# Patient Record
Sex: Male | Born: 1965
Health system: Southern US, Community
[De-identification: ages and names within clinical notes are randomized; demographics above are authoritative.]

## PROBLEM LIST (undated history)

## (undated) DIAGNOSIS — B1920 Unspecified viral hepatitis C without hepatic coma: Secondary | ICD-10-CM

## (undated) DIAGNOSIS — I2510 Atherosclerotic heart disease of native coronary artery without angina pectoris: Secondary | ICD-10-CM

## (undated) DIAGNOSIS — J342 Deviated nasal septum: Secondary | ICD-10-CM

## (undated) DIAGNOSIS — K469 Unspecified abdominal hernia without obstruction or gangrene: Secondary | ICD-10-CM

## (undated) DIAGNOSIS — Z8639 Personal history of other endocrine, nutritional and metabolic disease: Principal | ICD-10-CM

## (undated) DIAGNOSIS — N390 Urinary tract infection, site not specified: Secondary | ICD-10-CM

## (undated) DIAGNOSIS — K5792 Diverticulitis of intestine, part unspecified, without perforation or abscess without bleeding: Secondary | ICD-10-CM

## (undated) DIAGNOSIS — F429 Obsessive-compulsive disorder, unspecified: Secondary | ICD-10-CM

## (undated) DIAGNOSIS — I781 Nevus, non-neoplastic: Secondary | ICD-10-CM

## (undated) DIAGNOSIS — M419 Scoliosis, unspecified: Secondary | ICD-10-CM

## (undated) DIAGNOSIS — T7840XA Allergy, unspecified, initial encounter: Secondary | ICD-10-CM

## (undated) DIAGNOSIS — M129 Arthropathy, unspecified: Secondary | ICD-10-CM

## (undated) DIAGNOSIS — I5020 Unspecified systolic (congestive) heart failure: Secondary | ICD-10-CM

## (undated) DIAGNOSIS — K900 Celiac disease: Secondary | ICD-10-CM

## (undated) DIAGNOSIS — I10 Essential (primary) hypertension: Secondary | ICD-10-CM

## (undated) DIAGNOSIS — D66 Hereditary factor VIII deficiency: Secondary | ICD-10-CM

## (undated) HISTORY — DX: Unspecified systolic (congestive) heart failure: I50.20

## (undated) HISTORY — DX: Diverticulitis of intestine, part unspecified, without perforation or abscess without bleeding: K57.92

## (undated) HISTORY — DX: Nevus, non-neoplastic: I78.1

## (undated) HISTORY — DX: Unspecified viral hepatitis C without hepatic coma: B19.20

## (undated) HISTORY — DX: Celiac disease: K90.0

## (undated) HISTORY — DX: Atherosclerotic heart disease of native coronary artery without angina pectoris: I25.10

## (undated) HISTORY — PX: URETHRA SURGERY: SHX824

## (undated) HISTORY — DX: Essential (primary) hypertension: I10

## (undated) HISTORY — DX: Hereditary factor VIII deficiency: D66

## (undated) HISTORY — DX: Personal history of other endocrine, nutritional and metabolic disease: Z86.39

## (undated) HISTORY — DX: Arthropathy, unspecified: M12.9

## (undated) HISTORY — DX: Unspecified abdominal hernia without obstruction or gangrene: K46.9

## (undated) HISTORY — DX: Allergy, unspecified, initial encounter: T78.40XA

## (undated) HISTORY — DX: Scoliosis, unspecified: M41.9

## (undated) HISTORY — PX: TRIGGER FINGER RELEASE: SHX641

## (undated) HISTORY — PX: SCROTAL SURGERY: SHX2387

## (undated) HISTORY — DX: Deviated nasal septum: J34.2

## (undated) HISTORY — DX: Obsessive-compulsive disorder, unspecified: F42.9

---

## 1994-08-03 HISTORY — PX: ANKLE ARTHROSCOPY: SUR85

## 2001-03-08 ENCOUNTER — Encounter: Payer: Self-pay | Admitting: Emergency Medicine

## 2001-03-08 ENCOUNTER — Emergency Department (HOSPITAL_COMMUNITY): Admission: EM | Admit: 2001-03-08 | Discharge: 2001-03-08 | Payer: Self-pay | Admitting: Emergency Medicine

## 2001-03-09 ENCOUNTER — Ambulatory Visit (HOSPITAL_COMMUNITY): Admission: RE | Admit: 2001-03-09 | Discharge: 2001-03-09 | Payer: Self-pay | Admitting: Internal Medicine

## 2001-03-10 ENCOUNTER — Ambulatory Visit (HOSPITAL_COMMUNITY): Admission: RE | Admit: 2001-03-10 | Discharge: 2001-03-10 | Payer: Self-pay | Admitting: Internal Medicine

## 2001-09-16 ENCOUNTER — Emergency Department (HOSPITAL_COMMUNITY): Admission: EM | Admit: 2001-09-16 | Discharge: 2001-09-16 | Payer: Self-pay | Admitting: Emergency Medicine

## 2002-02-22 ENCOUNTER — Encounter: Payer: Self-pay | Admitting: Geriatric Medicine

## 2002-02-22 ENCOUNTER — Encounter: Admission: RE | Admit: 2002-02-22 | Discharge: 2002-02-22 | Payer: Self-pay | Admitting: Geriatric Medicine

## 2002-05-19 ENCOUNTER — Encounter: Payer: Self-pay | Admitting: Gastroenterology

## 2002-05-19 ENCOUNTER — Ambulatory Visit (HOSPITAL_COMMUNITY): Admission: RE | Admit: 2002-05-19 | Discharge: 2002-05-19 | Payer: Self-pay | Admitting: Gastroenterology

## 2002-11-07 ENCOUNTER — Encounter: Payer: Self-pay | Admitting: Gastroenterology

## 2002-11-07 ENCOUNTER — Encounter: Admission: RE | Admit: 2002-11-07 | Discharge: 2002-11-07 | Payer: Self-pay | Admitting: Gastroenterology

## 2003-01-02 ENCOUNTER — Encounter: Payer: Self-pay | Admitting: Gastroenterology

## 2003-01-02 ENCOUNTER — Encounter: Admission: RE | Admit: 2003-01-02 | Discharge: 2003-01-02 | Payer: Self-pay | Admitting: Gastroenterology

## 2003-03-06 ENCOUNTER — Ambulatory Visit (HOSPITAL_COMMUNITY): Admission: RE | Admit: 2003-03-06 | Discharge: 2003-03-06 | Payer: Self-pay | Admitting: Geriatric Medicine

## 2003-03-06 ENCOUNTER — Encounter: Payer: Self-pay | Admitting: Geriatric Medicine

## 2003-03-26 ENCOUNTER — Encounter: Admission: RE | Admit: 2003-03-26 | Discharge: 2003-03-26 | Payer: Self-pay | Admitting: Gastroenterology

## 2003-03-26 ENCOUNTER — Encounter: Payer: Self-pay | Admitting: Gastroenterology

## 2003-04-03 ENCOUNTER — Encounter: Payer: Self-pay | Admitting: Geriatric Medicine

## 2003-04-03 ENCOUNTER — Encounter: Admission: RE | Admit: 2003-04-03 | Discharge: 2003-04-03 | Payer: Self-pay | Admitting: Geriatric Medicine

## 2003-04-04 ENCOUNTER — Ambulatory Visit (HOSPITAL_COMMUNITY): Admission: RE | Admit: 2003-04-04 | Discharge: 2003-04-04 | Payer: Self-pay | Admitting: Geriatric Medicine

## 2003-11-27 ENCOUNTER — Encounter: Admission: RE | Admit: 2003-11-27 | Discharge: 2003-11-27 | Payer: Self-pay | Admitting: Dermatology

## 2003-12-14 ENCOUNTER — Encounter: Admission: RE | Admit: 2003-12-14 | Discharge: 2003-12-14 | Payer: Self-pay | Admitting: Gastroenterology

## 2004-05-19 ENCOUNTER — Ambulatory Visit (HOSPITAL_COMMUNITY): Admission: RE | Admit: 2004-05-19 | Discharge: 2004-05-19 | Payer: Self-pay | Admitting: General Surgery

## 2004-05-20 ENCOUNTER — Encounter (INDEPENDENT_AMBULATORY_CARE_PROVIDER_SITE_OTHER): Payer: Self-pay | Admitting: Specialist

## 2004-05-20 ENCOUNTER — Ambulatory Visit (HOSPITAL_COMMUNITY): Admission: RE | Admit: 2004-05-20 | Discharge: 2004-05-20 | Payer: Self-pay | Admitting: General Surgery

## 2004-05-20 ENCOUNTER — Ambulatory Visit (HOSPITAL_BASED_OUTPATIENT_CLINIC_OR_DEPARTMENT_OTHER): Admission: RE | Admit: 2004-05-20 | Discharge: 2004-05-20 | Payer: Self-pay | Admitting: General Surgery

## 2004-07-31 ENCOUNTER — Ambulatory Visit: Payer: Self-pay | Admitting: Oncology

## 2004-11-18 ENCOUNTER — Encounter: Admission: RE | Admit: 2004-11-18 | Discharge: 2004-11-18 | Payer: Self-pay | Admitting: Gastroenterology

## 2005-02-20 ENCOUNTER — Ambulatory Visit: Payer: Self-pay | Admitting: Oncology

## 2005-08-19 ENCOUNTER — Ambulatory Visit: Payer: Self-pay | Admitting: Oncology

## 2005-09-22 ENCOUNTER — Encounter: Admission: RE | Admit: 2005-09-22 | Discharge: 2005-12-21 | Payer: Self-pay | Admitting: Gastroenterology

## 2005-12-01 ENCOUNTER — Encounter: Admission: RE | Admit: 2005-12-01 | Discharge: 2005-12-01 | Payer: Self-pay | Admitting: Gastroenterology

## 2006-05-18 ENCOUNTER — Ambulatory Visit: Payer: Self-pay | Admitting: Oncology

## 2007-01-19 ENCOUNTER — Ambulatory Visit: Payer: Self-pay | Admitting: Oncology

## 2007-01-25 LAB — FACTOR 8 ASSAY: Coagulation Factor VIII: 4 % — ABNORMAL LOW (ref 57–124)

## 2007-01-25 LAB — FACTOR 8 INHIBITOR
Factor VIII: C, Activity: 3 % — CL (ref 56–190)
VIII Inhibitor, Bethesda: 0.5 U/mL — ABNORMAL HIGH (ref 0.0–0.0)

## 2007-01-26 LAB — VITAMIN D PNL(25-HYDRXY+1,25-DIHY)-BLD
Vit D, 1,25-Dihydroxy: 32 pg/mL (ref 6–62)
Vit D, 25-Hydroxy: 23 ng/mL (ref 20–57)

## 2007-01-26 LAB — HEPATITIS C RNA QUANTITATIVE
HCV Quantitative Log: <0.7 {Log} (ref <0.70)
HCV Quantitative: <5 IU/mL (ref <5)

## 2007-03-30 ENCOUNTER — Encounter (INDEPENDENT_AMBULATORY_CARE_PROVIDER_SITE_OTHER): Payer: Self-pay | Admitting: Geriatric Medicine

## 2007-03-30 ENCOUNTER — Ambulatory Visit (HOSPITAL_COMMUNITY): Admission: RE | Admit: 2007-03-30 | Discharge: 2007-03-30 | Payer: Self-pay | Admitting: Geriatric Medicine

## 2007-08-24 ENCOUNTER — Encounter: Admission: RE | Admit: 2007-08-24 | Discharge: 2007-08-24 | Payer: Self-pay | Admitting: Gastroenterology

## 2007-09-21 ENCOUNTER — Ambulatory Visit: Payer: Self-pay | Admitting: Oncology

## 2008-06-14 ENCOUNTER — Ambulatory Visit: Payer: Self-pay | Admitting: Gastroenterology

## 2008-06-19 ENCOUNTER — Ambulatory Visit: Payer: Self-pay | Admitting: Oncology

## 2009-07-05 ENCOUNTER — Ambulatory Visit: Payer: Self-pay | Admitting: Oncology

## 2010-03-21 ENCOUNTER — Ambulatory Visit: Payer: Self-pay | Admitting: Oncology

## 2010-03-24 LAB — CBC WITH DIFFERENTIAL/PLATELET
BASO%: 0.5 % (ref 0.0–2.0)
Basophils Absolute: 0 10*3/uL (ref 0.0–0.1)
EOS%: 1.3 % (ref 0.0–7.0)
Eosinophils Absolute: 0.1 10*3/uL (ref 0.0–0.5)
HCT: 41.7 % (ref 38.4–49.9)
HGB: 14.3 g/dL (ref 13.0–17.1)
LYMPH%: 27.9 % (ref 14.0–49.0)
MCH: 30.9 pg (ref 27.2–33.4)
MCHC: 34.3 g/dL (ref 32.0–36.0)
MCV: 90.2 fL (ref 79.3–98.0)
MONO#: 0.7 10*3/uL (ref 0.1–0.9)
MONO%: 10.6 % (ref 0.0–14.0)
NEUT#: 4.2 10*3/uL (ref 1.5–6.5)
NEUT%: 59.7 % (ref 39.0–75.0)
Platelets: 329 10*3/uL (ref 140–400)
RBC: 4.63 10*6/uL (ref 4.20–5.82)
RDW: 12.7 % (ref 11.0–14.6)
WBC: 7 10*3/uL (ref 4.0–10.3)
lymph#: 2 10*3/uL (ref 0.9–3.3)

## 2010-03-24 LAB — COMPREHENSIVE METABOLIC PANEL
ALT: 23 U/L (ref 0–53)
AST: 28 U/L (ref 0–37)
Albumin: 4.5 g/dL (ref 3.5–5.2)
Alkaline Phosphatase: 85 U/L (ref 39–117)
BUN: 9 mg/dL (ref 6–23)
CO2: 28 mEq/L (ref 19–32)
Calcium: 9.5 mg/dL (ref 8.4–10.5)
Chloride: 107 mEq/L (ref 96–112)
Creatinine, Ser: 0.67 mg/dL (ref 0.40–1.50)
Glucose, Bld: 119 mg/dL — ABNORMAL HIGH (ref 70–99)
Potassium: 3.8 mEq/L (ref 3.5–5.3)
Sodium: 140 mEq/L (ref 135–145)
Total Bilirubin: 0.6 mg/dL (ref 0.3–1.2)
Total Protein: 8.1 g/dL (ref 6.0–8.3)

## 2010-03-24 LAB — PROTIME-INR
INR: 1 — ABNORMAL LOW (ref 2.00–3.50)
Protime: 12 Seconds (ref 10.6–13.4)

## 2010-03-24 LAB — AMMONIA: Ammonia: 34 umol/L (ref 11–35)

## 2010-03-25 LAB — FACTOR 8 ASSAY: Coagulation Factor VIII: 17 % — ABNORMAL LOW (ref 73–140)

## 2010-03-26 LAB — HEPATITIS C RNA QUANTITATIVE

## 2010-04-05 LAB — FACTOR 8 INHIBITOR: Factor VIII: C, Activity: 2 % — CL (ref 56–191)

## 2010-04-05 LAB — FACTOR 8 ASSAY: Coagulation Factor VIII: 10 % — ABNORMAL LOW (ref 73–140)

## 2010-04-18 ENCOUNTER — Encounter: Admission: RE | Admit: 2010-04-18 | Discharge: 2010-04-18 | Payer: Self-pay | Admitting: Gastroenterology

## 2010-11-20 ENCOUNTER — Encounter (HOSPITAL_BASED_OUTPATIENT_CLINIC_OR_DEPARTMENT_OTHER): Payer: BC Managed Care – PPO | Admitting: Oncology

## 2010-11-20 DIAGNOSIS — B1920 Unspecified viral hepatitis C without hepatic coma: Secondary | ICD-10-CM

## 2010-11-20 DIAGNOSIS — E559 Vitamin D deficiency, unspecified: Secondary | ICD-10-CM

## 2010-11-20 DIAGNOSIS — D66 Hereditary factor VIII deficiency: Secondary | ICD-10-CM

## 2010-11-20 DIAGNOSIS — K900 Celiac disease: Secondary | ICD-10-CM

## 2010-12-19 NOTE — Op Note (Signed)
NAME:  Darrell Graham, Darrell Graham              ACCOUNT NO.:  [id number]   MEDICAL RECORD NO.:  [id number]          PATIENT TYPE:  AMB   LOCATION:  DSC                          FACILITY:  [facility]   PHYSICIAN:  Sharlet Salina T. Hoxworth, M.D.DATE OF BIRTH:  05/28/1966   DATE OF PROCEDURE:  05/20/2004  DATE OF DISCHARGE:                                 OPERATIVE REPORT   PREOPERATIVE DIAGNOSIS:  Sebaceous cyst of scrotum x3.   PROCEDURE:  Excision of sebaceous cyst of scrotum x3.   SURGEON:  Lorne Skeens. Hoxworth, M.D.   ANESTHESIA:  Local with IV sedation.   INDICATIONS FOR PROCEDURE:  The patient is a 45 year old male who presents  with enlarging and uncomfortable sebaceous cysts involving the scrotum in  three areas.  Examination shows 5 mm, 4 mm, and 2 mm sebaceous cysts.  These  are enlarging, tender, and bother him.  He does have a significant history  of hemophilia.  After discussion of benefits, risks, and options, we have  elected to proceed with excision under local anesthesia with sedation.  He  received preoperative recombinant factor VIII this morning.  He is now  brought to the operating room for excision.   DESCRIPTION OF PROCEDURE:  The patient was brought to the operating room and  placed in the supine position on the operating table.  IV sedation was  administered.  He was placed in the frog-leg position and the perineum and  scrotum sterilely prepped and draped.  Ancef was given preoperatively.  Local anesthesia was used to infiltrate the scrotal skin surrounding each  lesion.  In each case, the lesion was elliptically excised with a small rim  of surrounding normal skin into the subcutaneous tissue.  Hemostasis was  obtained with cautery.  There was no unusual bleeding.  Each wound was then  closed with interrupted 4-0 nylon.  Needle, sponge, and instrument counts  correct.  Dry gauze dressing was applied and the patient was taken to the  recovery room in good condition.       BTH/MEDQ  D:  05/20/2004  T:  05/20/2004  Job:  [id number]

## 2011-04-02 ENCOUNTER — Ambulatory Visit
Admission: RE | Admit: 2011-04-02 | Discharge: 2011-04-02 | Disposition: A | Discharge status: Home / Self Care | Payer: BC Managed Care – PPO | Source: Ambulatory Visit | Attending: Geriatric Medicine | Admitting: Geriatric Medicine

## 2011-04-02 ENCOUNTER — Other Ambulatory Visit: Payer: Self-pay | Admitting: Geriatric Medicine

## 2011-04-02 DIAGNOSIS — J3489 Other specified disorders of nose and nasal sinuses: Secondary | ICD-10-CM

## 2011-04-08 ENCOUNTER — Other Ambulatory Visit: Payer: Self-pay | Admitting: Geriatric Medicine

## 2011-04-08 ENCOUNTER — Ambulatory Visit
Admission: RE | Admit: 2011-04-08 | Discharge: 2011-04-08 | Disposition: A | Discharge status: Home / Self Care | Payer: BC Managed Care – PPO | Source: Ambulatory Visit | Attending: Geriatric Medicine | Admitting: Geriatric Medicine

## 2011-04-08 DIAGNOSIS — R29898 Other symptoms and signs involving the musculoskeletal system: Secondary | ICD-10-CM

## 2011-04-14 ENCOUNTER — Other Ambulatory Visit: Payer: Self-pay | Admitting: Neurology

## 2011-04-14 ENCOUNTER — Ambulatory Visit
Admission: RE | Admit: 2011-04-14 | Discharge: 2011-04-14 | Disposition: A | Discharge status: Home / Self Care | Payer: BC Managed Care – PPO | Source: Ambulatory Visit | Attending: Neurology | Admitting: Neurology

## 2011-04-14 DIAGNOSIS — R531 Weakness: Secondary | ICD-10-CM

## 2011-04-14 DIAGNOSIS — R5383 Other fatigue: Secondary | ICD-10-CM

## 2011-05-04 ENCOUNTER — Other Ambulatory Visit: Payer: Self-pay | Admitting: Neurological Surgery

## 2011-05-07 ENCOUNTER — Other Ambulatory Visit: Payer: Self-pay | Admitting: Neurology

## 2011-05-07 DIAGNOSIS — G35 Multiple sclerosis: Secondary | ICD-10-CM

## 2011-05-13 ENCOUNTER — Other Ambulatory Visit: Payer: BC Managed Care – PPO

## 2011-05-19 ENCOUNTER — Other Ambulatory Visit: Payer: BC Managed Care – PPO

## 2011-06-09 ENCOUNTER — Inpatient Hospital Stay
Admission: RE | Admit: 2011-06-09 | Discharge: 2011-06-09 | Payer: BC Managed Care – PPO | Source: Ambulatory Visit | Attending: Neurology | Admitting: Neurology

## 2011-06-17 ENCOUNTER — Other Ambulatory Visit: Payer: BC Managed Care – PPO

## 2011-10-23 ENCOUNTER — Telehealth: Payer: Self-pay | Admitting: *Deleted

## 2011-10-23 NOTE — Telephone Encounter (Signed)
Call from pt reporting internal, ankle bleed. Pt has Factor VIII in the home, S/O was able to administer a dose on 10/22/11 but unable to access vein today. Pt needs renewal of standing order for nursing services.  Reviewed with Dr. Truett Perna: OK to renew nursing order. Have pt report to ED if bleeding does not subside. Call from [facility] with [facility] to renew order. Verbal order given to administer 3,000 units Factor 8 IV as needed for bleeding.  Pt given instructions to go to ED if bleeding does not stop. He verbalized understanding.

## 2011-11-05 ENCOUNTER — Telehealth: Payer: Self-pay | Admitting: *Deleted

## 2011-11-05 NOTE — Telephone Encounter (Signed)
Patient left VM reporting a bleed on left shin 11/04/11 and RN came to home and administered 3000 units of Xynthia. Doing much better today. Confirms he is aware of his appointment next week.

## 2011-11-19 ENCOUNTER — Ambulatory Visit (HOSPITAL_BASED_OUTPATIENT_CLINIC_OR_DEPARTMENT_OTHER): Payer: BC Managed Care – PPO | Admitting: Oncology

## 2011-11-19 ENCOUNTER — Telehealth: Payer: Self-pay | Admitting: *Deleted

## 2011-11-19 ENCOUNTER — Ambulatory Visit: Payer: BC Managed Care – PPO | Admitting: Lab

## 2011-11-19 ENCOUNTER — Encounter: Payer: Self-pay | Admitting: Oncology

## 2011-11-19 VITALS — BP 127/77 | HR 92 | Temp 99.1°F | Ht 70.0 in | Wt 170.8 lb

## 2011-11-19 DIAGNOSIS — D66 Hereditary factor VIII deficiency: Secondary | ICD-10-CM

## 2011-11-19 DIAGNOSIS — M25639 Stiffness of unspecified wrist, not elsewhere classified: Secondary | ICD-10-CM

## 2011-11-19 DIAGNOSIS — M25676 Stiffness of unspecified foot, not elsewhere classified: Secondary | ICD-10-CM

## 2011-11-19 DIAGNOSIS — R5383 Other fatigue: Secondary | ICD-10-CM

## 2011-11-19 DIAGNOSIS — R5381 Other malaise: Secondary | ICD-10-CM

## 2011-11-19 DIAGNOSIS — M25673 Stiffness of unspecified ankle, not elsewhere classified: Secondary | ICD-10-CM

## 2011-11-19 LAB — CBC WITH DIFFERENTIAL/PLATELET
BASO%: 0.4 % (ref 0.0–2.0)
Basophils Absolute: 0 10*3/uL (ref 0.0–0.1)
EOS%: 0.7 % (ref 0.0–7.0)
Eosinophils Absolute: 0 10*3/uL (ref 0.0–0.5)
HCT: 40.5 % (ref 38.4–49.9)
HGB: 14 g/dL (ref 13.0–17.1)
LYMPH%: 21 % (ref 14.0–49.0)
MCH: 31.2 pg (ref 27.2–33.4)
MCHC: 34.6 g/dL (ref 32.0–36.0)
MCV: 90.3 fL (ref 79.3–98.0)
MONO#: 0.6 10*3/uL (ref 0.1–0.9)
MONO%: 8.6 % (ref 0.0–14.0)
NEUT#: 5.1 10*3/uL (ref 1.5–6.5)
NEUT%: 69.3 % (ref 39.0–75.0)
Platelets: 321 10*3/uL (ref 140–400)
RBC: 4.48 10*6/uL (ref 4.20–5.82)
RDW: 13 % (ref 11.0–14.6)
WBC: 7.4 10*3/uL (ref 4.0–10.3)
lymph#: 1.5 10*3/uL (ref 0.9–3.3)
nRBC: 0 % (ref 0–0)

## 2011-11-19 NOTE — Telephone Encounter (Signed)
gave patient appointment for 07-2012 printed out calendar and gave to the patient 

## 2011-11-19 NOTE — Progress Notes (Signed)
OFFICE PROGRESS NOTE   INTERVAL HISTORY:   She returns as scheduled. He was diagnosed with a pineal cyst on an MRI of the brain in September 2012. He reports being evaluated by neurology and no specific treatment recommendation was made.  He continues to feel weak and tired. He had soft tissue bleeds at the right and left lower leg following trauma. These bleeds were spaced approximately one to 2 weeks apart and occurred 1 month ago. The bleeding responded to factor VIII replacement. He continues home treatment via a home care RN. He generally requires treatment once every 6-8 months. Limited range of motion at the right elbow and left ankle.  Objective:  Vital signs in last 24 hours:  Blood pressure 127/77, pulse 92, temperature 99.1 F (37.3 C), temperature source Oral, height 5\' 10"  (1.778 m), weight 170 lb 12.8 oz (77.474 kg).   Resp: Lungs clear bilaterally Cardio: Regular rate and rhythm GI: No hepatosplenomegaly Vascular: No leg edema  Skin: Telangiectasias at the upper chest Musculoskeletal: Limited range of motion at the right elbow and left ankle. There is a small resolved hematoma at the left lower pretibial area.   Lab Results:  Factor VIII level and hepatitis C RNA pending   Medications: I have reviewed the patient's current medications.  Assessment/Plan: 1.  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 2.  History of hepatitis C infection. He requested a hepatitis C RNA level today 3.  Chronic left ankle and right elbow arthropathy. 4.  Celiac disease. 5.  History of vitamin D deficiency. 6.  History of hypertension. 7.  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 8.  diagnosis of "scoliosis." 9. 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 He will continue home treatment with factor VIII as needed. He requested a factor VIII level, hepatitis C  RNA, and CBC today. He will return problems visit in 8 months. He continues followup with Dr. Pete Glatter for internal medicine care.   Thornton Papas, MD  11/19/2011  1:52 PM

## 2011-11-24 LAB — FACTOR 8 INHIBITOR

## 2011-11-24 LAB — COMPREHENSIVE METABOLIC PANEL
ALT: 14 U/L (ref 0–53)
AST: 21 U/L (ref 0–37)
Albumin: 4.8 g/dL (ref 3.5–5.2)
Alkaline Phosphatase: 69 U/L (ref 39–117)
BUN: 13 mg/dL (ref 6–23)
CO2: 28 mEq/L (ref 19–32)
Calcium: 9.5 mg/dL (ref 8.4–10.5)
Chloride: 105 mEq/L (ref 96–112)
Creatinine, Ser: 0.86 mg/dL (ref 0.50–1.35)
Glucose, Bld: 124 mg/dL — ABNORMAL HIGH (ref 70–99)
Potassium: 4 mEq/L (ref 3.5–5.3)
Sodium: 142 mEq/L (ref 135–145)
Total Bilirubin: 0.4 mg/dL (ref 0.3–1.2)
Total Protein: 7.6 g/dL (ref 6.0–8.3)

## 2011-11-24 LAB — FACTOR 8 ASSAY: Coagulation Factor VIII: 7 % — ABNORMAL LOW (ref 73–140)

## 2011-11-24 LAB — HEPATITIS C RNA QUANTITATIVE: HCV Quantitative: NOT DETECTED IU/mL (ref <43)

## 2011-11-25 ENCOUNTER — Telehealth: Payer: Self-pay | Admitting: *Deleted

## 2011-11-25 NOTE — Telephone Encounter (Signed)
Message copied by Caleb Popp on Wed Nov 25, 2011  1:14 PM ------      Message from: Thornton Papas B      Created: Mon Nov 23, 2011  7:02 PM       Please call patient, hep. C not detected.  Factor 8 at 7%, f/u as scheduled

## 2011-11-25 NOTE — Telephone Encounter (Signed)
Called pt with lab results. He requests a copy to be mailed to home.

## 2011-12-01 ENCOUNTER — Encounter: Payer: Self-pay | Admitting: *Deleted

## 2011-12-01 NOTE — Progress Notes (Signed)
Lab results from 11/19/11 mailed to patient per his request.

## 2012-01-11 ENCOUNTER — Other Ambulatory Visit: Payer: Self-pay | Admitting: *Deleted

## 2012-01-11 NOTE — Telephone Encounter (Signed)
Pharmacy faxed request for refill on xyntha 3000 units prn bleeding X 2 doses. Patient wants over night delivery. Need fax returned asap. Forwarded message to triage.

## 2012-01-12 ENCOUNTER — Other Ambulatory Visit: Payer: Self-pay | Admitting: *Deleted

## 2012-01-12 DIAGNOSIS — D689 Coagulation defect, unspecified: Secondary | ICD-10-CM

## 2012-01-12 MED ORDER — ANTIHEM FACTOR RECOMB (RFVIII) 3000 UNITS IV KIT: 3000.0000 [IU] | PACK | INTRAVENOUS | Status: DC | PRN

## 2012-05-05 ENCOUNTER — Encounter: Payer: Self-pay | Admitting: Oncology

## 2012-05-05 NOTE — Progress Notes (Signed)
Faxed signed orders to [facility] [phone].

## 2012-07-21 ENCOUNTER — Ambulatory Visit: Payer: BC Managed Care – PPO | Admitting: Oncology

## 2012-07-21 ENCOUNTER — Telehealth: Payer: Self-pay | Admitting: Oncology

## 2012-07-21 NOTE — Telephone Encounter (Signed)
Pt called and r/s appt to January 21st MD only, pt aware of appt, nurse notified

## 2012-08-23 ENCOUNTER — Ambulatory Visit: Payer: BC Managed Care – PPO | Admitting: Oncology

## 2012-09-23 ENCOUNTER — Ambulatory Visit (HOSPITAL_BASED_OUTPATIENT_CLINIC_OR_DEPARTMENT_OTHER): Payer: BC Managed Care – PPO | Admitting: Oncology

## 2012-09-23 ENCOUNTER — Telehealth: Payer: Self-pay | Admitting: *Deleted

## 2012-09-23 ENCOUNTER — Ambulatory Visit (HOSPITAL_BASED_OUTPATIENT_CLINIC_OR_DEPARTMENT_OTHER): Payer: BC Managed Care – PPO | Admitting: Lab

## 2012-09-23 ENCOUNTER — Telehealth: Payer: Self-pay | Admitting: Oncology

## 2012-09-23 VITALS — BP 144/88 | HR 86 | Temp 97.9°F | Resp 20 | Ht 70.0 in | Wt 164.6 lb

## 2012-09-23 DIAGNOSIS — F430 Acute stress reaction: Secondary | ICD-10-CM

## 2012-09-23 DIAGNOSIS — B1920 Unspecified viral hepatitis C without hepatic coma: Secondary | ICD-10-CM

## 2012-09-23 DIAGNOSIS — M19029 Primary osteoarthritis, unspecified elbow: Secondary | ICD-10-CM

## 2012-09-23 DIAGNOSIS — D66 Hereditary factor VIII deficiency: Secondary | ICD-10-CM

## 2012-09-23 NOTE — Telephone Encounter (Signed)
Called Dr. Nolen Mu psychiatry left messasge regarding referral gave order to HIM to fax medical records, also gave pt appt for November Md only

## 2012-09-23 NOTE — Progress Notes (Signed)
   [facility]    OFFICE PROGRESS NOTE   INTERVAL HISTORY:   He returns as scheduled. He reports last receiving factor VIII treatment approximately 6 months ago. He has chronic stiffness at the right elbow and left ankle. He has chronic back pain secondary to "scoliosis ". He developed a bleed in the right elbow approximately 2 days ago after washing his car. He does not feel this bleed is severe enough to require factor VIII therapy.  Mr. Shakespeare complains of feeling "stress "secondary to his mother being diagnosed with Alzheimer's. He cares for her in the home. He request an evaluation for stress and emotional lability.  Objective:  Vital signs in last 24 hours:  Blood pressure 144/88, pulse 86, temperature 97.9 F (36.6 C), temperature source Oral, resp. rate 20, height 5\' 10"  (1.778 m), weight 164 lb 9.6 oz (74.662 kg).   Resp: Lungs clear bilaterally Cardio: Regular rate and rhythm GI: No hepatosplenic the, mild tenderness in the right subcostal region Vascular: No leg edema  Skin: Multiple telengectasias over the trunk  Muscular skeletal: There is warmth and mild swelling at the right elbow.  Portacath/PICC-without erythema  Lab Results:  Lab Results  Component Value Date   WBC 7.4 11/19/2011   HGB 14.0 11/19/2011   HCT 40.5 11/19/2011   MCV 90.3 11/19/2011   PLT 321 11/19/2011   ANC 5.1  Factor VIII level 7%-11/19/2011 Hepatitis C RNA quantitative-not detected-11/19/2011  Medications: I have reviewed the patient's current medications.  Assessment/Plan: 1. 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  2. History of hepatitis C infection. Negative hepatitis C RNA in April 2013. He requested we check another level today. 3. Chronic left ankle and right elbow arthropathy. Acute hemorrhage at the right elbow today. 4. Celiac disease.  5. History of vitamin D deficiency.  6. History of hypertension.  7. Telangiectasias of the  upper back and chest.  8. diagnosis of "scoliosis."  9. Diagnosis of a "pineal "cyst in September 2012, evaluated by neurology    Disposition:  His overall status is unchanged. He will treat the right elbow hemorrhage with conservative measures. He has developed chronic arthropathy of the right elbow and left ankle. He will contact us if he desires an orthopedic referral.  Mr. Keltz requested a psychiatry referral for evaluation of "stress "and emotional swings.  He will return for an office visit in one year. He will contact us in the interim as needed. He continues to receive factor VIII replacement at home via a home care agency.   Thornton Papas, MD  09/23/2012  1:25 PM

## 2012-09-23 NOTE — Telephone Encounter (Signed)
Left message offering pt earlier appt today at 1130. Scheduled to see MD at 1230.

## 2012-09-26 ENCOUNTER — Encounter: Payer: Self-pay | Admitting: Oncology

## 2012-09-26 NOTE — Progress Notes (Signed)
Pt's daughter called requesting a copy of receipt for 09/23/12 copay pt made that he misplaced.  I got receipt from Ms. Wilma, made a copy and mailed to pt today.

## 2012-09-27 ENCOUNTER — Telehealth: Payer: Self-pay | Admitting: Oncology

## 2012-09-27 ENCOUNTER — Telehealth: Payer: Self-pay | Admitting: *Deleted

## 2012-09-27 LAB — FACTOR 8 INHIBITOR: Factor VIII: C, Activity: 5 % — CL (ref 56–191)

## 2012-09-27 LAB — FACTOR 8 ASSAY: Coagulation Factor VIII: 8 % — ABNORMAL LOW (ref 73–140)

## 2012-09-27 LAB — HEPATITIS C RNA QUANTITATIVE: HCV Quantitative: NOT DETECTED IU/mL (ref <15)

## 2012-09-27 NOTE — Telephone Encounter (Signed)
Dr Nolen Mu office, psychiatry wants pt to call them, called pt,left  tle# to her office so patient can set up schedule

## 2012-09-27 NOTE — Telephone Encounter (Signed)
Left message on voicemail for pt to call office for lab results. 

## 2012-09-27 NOTE — Telephone Encounter (Signed)
Faxed pt medical records to Dr. Nolen Mu

## 2012-09-27 NOTE — Telephone Encounter (Signed)
Message copied by Caleb Popp on Tue Sep 27, 2012  5:16 PM ------      Message from: Thornton Papas B      Created: Mon Sep 26, 2012  9:41 PM       Please call patient, f8 level is stable, hep RNA is negative ------

## 2012-09-28 ENCOUNTER — Telehealth: Payer: Self-pay | Admitting: *Deleted

## 2012-09-28 NOTE — Telephone Encounter (Signed)
Message copied by Gala Romney on Wed Sep 28, 2012  3:51 PM ------      Message from: Thornton Papas B      Created: Mon Sep 26, 2012  9:41 PM       Please call patient, f8 level is stable, hep RNA is negative ------

## 2012-09-28 NOTE — Telephone Encounter (Signed)
Spoke with pt and he was informed of lab results.  Pt requested copy sent to him and asked to forward results to PCP Dr. Merlene Laughter.  Copies put in mail and results forwarded.

## 2013-01-24 ENCOUNTER — Encounter: Payer: Self-pay | Admitting: Oncology

## 2013-01-30 NOTE — Progress Notes (Signed)
Faxed completed orders back to [facility] for his home health.

## 2013-02-06 ENCOUNTER — Other Ambulatory Visit: Payer: Self-pay | Admitting: *Deleted

## 2013-02-06 DIAGNOSIS — D689 Coagulation defect, unspecified: Secondary | ICD-10-CM

## 2013-02-06 NOTE — Telephone Encounter (Signed)
THIS REFILL REQUEST FOR XYNTHA WAS GIVEN TO DR.SHERRILL'S NURSE, SUSAN COWARD,RN.

## 2013-02-07 MED ORDER — ANTIHEM FACTOR RECOMB (RFVIII) 3000 UNITS IV KIT: 3000.0000 [IU] | PACK | INTRAVENOUS | Status: DC | PRN

## 2013-02-07 NOTE — Addendum Note (Signed)
Addended by: Arvilla Meres on: 02/07/2013 12:24 PM   Modules accepted: Orders

## 2013-02-14 ENCOUNTER — Encounter: Payer: Self-pay | Admitting: Oncology

## 2013-02-14 NOTE — Progress Notes (Addendum)
Completed [employer] paperwork for patient assistance. Called Lizzie and let him know it was ready to be picked up. Met patient outside and gave him the papers.

## 2013-03-13 ENCOUNTER — Telehealth: Payer: Self-pay | Admitting: Neurology

## 2013-03-13 NOTE — Telephone Encounter (Signed)
From [facility], last seen 2012.  Needs appt (Dr.Yan)

## 2013-03-20 NOTE — Telephone Encounter (Signed)
Called pt scheduled for 03/23/13 @2 :30

## 2013-03-23 ENCOUNTER — Ambulatory Visit (INDEPENDENT_AMBULATORY_CARE_PROVIDER_SITE_OTHER): Payer: BC Managed Care – PPO | Admitting: Neurology

## 2013-03-23 ENCOUNTER — Encounter: Payer: Self-pay | Admitting: Neurology

## 2013-03-23 VITALS — BP 121/67 | HR 99 | Ht 71.0 in | Wt 156.0 lb

## 2013-03-23 DIAGNOSIS — D66 Hereditary factor VIII deficiency: Secondary | ICD-10-CM | POA: Insufficient documentation

## 2013-03-23 DIAGNOSIS — R51 Headache: Secondary | ICD-10-CM

## 2013-03-23 DIAGNOSIS — R519 Headache, unspecified: Secondary | ICD-10-CM | POA: Insufficient documentation

## 2013-03-23 MED ORDER — NORTRIPTYLINE HCL 10 MG PO CAPS: 20.0000 mg | ORAL_CAPSULE | Freq: Every day | ORAL | Status: DC

## 2013-03-24 NOTE — Progress Notes (Signed)
History of Present Illness:   Jantz is a 47 year old right-handed Caucasian male, accompanied by his mother at today's clinical visit. I saw him previously in 04/10/2011.  He had past medical history of hemophilia, receiving factor VIII transfusion occasionally, has knee joints bleeding sometimes, he also had a history of hepatitis C, was treated with interferon in the past,   I saw him in 2012 for mild abnormal MRI of the brain which was taken to complains of frequent headaches, stiff neck, fluid sensation in his head and face, difficulty swallowing, anxiety, heart palpitations, difficulty swallowing, pain in extremities,   MRI of brain showed evidence of 16 mm pineal cyst, there was also scattered punctuated subcortical T2 and FLAIR hyperdensity bilaterally, most consistent with microvascular ischemia, there is no evidence of multiple sclerosis.  He returned today continued to complains of constant headaches, 6 out of 10, pressure sensation, as if his eyes going to popping mild, getting worse when he lays down on his pillow, he denies dysphagia, no dysarthria, no visual change, he also complains of gait difficulty because of bilateral knee pain, legs gave out underneath him, his balance are off easily.  He continued to combat  Anxiety.   Review of Systems  Out of a complete 14 system review, the patient complains of only the following symptoms, and all other reviewed systems are negative.   Weight loss, fatigue, ringing in ears, rash, itching, blurred vision, double vision, eye pain, urination problem, easy bruising, easy bleeding, feeling hot, increased thirst, flushing, joints pain, joint swelling, achy muscles, allergy, runny nose, skin sensitivity, memory loss, confusion, headaches, weakness, insomnia, sleepiness, anxiety, not enough sleep, decreased energy, racing thoughts  Social History  Patient is single he lives in the home with his mother. He completed 12 years of high school. Patient  is currently not working. Patient denies tobacco, alcohol, and illicit drug use. Patient consumes 1 soft drink daily. Any Tobacco Use: Never used Inhaled Tobacco Use: never smoker  Family History  Both parents history of hypertension. Patient's mother alive and father deceased from stroke.  Past Medical History  hemophilia factor deficiency- Dr. Truett Perna Allergic rhinits hepatitis C Osteopenia Psoriasis GERD Vit D deficiency Hypertension Diverticulitis Hiatal hernia Glaucoma Scoliosis  Surgical History  none listed  Physical Exam  Neck: supple no carotid bruits Respiratory: clear to auscultation bilaterally Cardiovascular: regular rate rhythm  Neurologic Exam  Mental Status: tired anxious looking middle aged male, awake, alert, cooperative to history, talking, and casual conversation. Cranial Nerves: CN II-XII pupils were equal round reactive to light.  Fundi were sharp bilaterally.  Extraocular movements were full.  Visual fields were full on confrontational test.  Facial sensation and strength were normal.  Hearing was intact to finger rubbing bilaterally.  Uvula tongue were midline.  Head turning and shoulder shrugging were normal and symmetric.  Tongue protrusion into the cheeks strength were normal.  Motor: Normal tone, bulk, and strength. Sensory: Normal to light touch, pinprick, proprioception, and vibratory sensation. Coordination: Normal finger-to-nose, heel-to-shin.  There was no dysmetria noticed. Gait and Station: Narrow based and steady, was able to perform tiptoe, heel, and tandem walking without difficulty.  Romberg sign: Negative Reflexes: Deep tendon reflexes: Biceps: 2/2, Brachioradialis: 2/2, Triceps: 2/2, Pateller: 2/2, Achilles: 2/2.  Plantar responses are flexor.   Assessment and Plan:  47 year old right-handed Caucasian male, presenting with generalized fatigue, weakness, normal neurological examination, MRI of the brain has demonstrate 16 mm cystic  lesion at the pineal gland, scattered punctuated T2 and FLAIR  hyperdensity consistent with a microvascular ischemia, the morphology and the distribution are not consistent with multiple sclerosis.  1. he now with worsening headaches, design further evaluation, with his history of hemophilia, pineal cyst in the past, will repeat MRI of the brain. 2. his headaches are consistent with tension headache, will add on nortriptyline as preventive medications 3.   Return to clinic with Eber Jones in 3 months

## 2013-03-30 ENCOUNTER — Ambulatory Visit (INDEPENDENT_AMBULATORY_CARE_PROVIDER_SITE_OTHER): Payer: BC Managed Care – PPO

## 2013-03-30 DIAGNOSIS — D66 Hereditary factor VIII deficiency: Secondary | ICD-10-CM

## 2013-03-30 DIAGNOSIS — R51 Headache: Secondary | ICD-10-CM

## 2013-04-04 NOTE — Progress Notes (Signed)
Quick Note:  Please call him, MRI brain showed no changes compared to 04/08/2011, continued evidence of small pineal cyst. ______

## 2013-04-05 ENCOUNTER — Telehealth: Payer: Self-pay | Admitting: *Deleted

## 2013-04-05 NOTE — Telephone Encounter (Signed)
I saw Dr. Terrace Arabia a few week ago. She prescribed meds that are too strong. I am not taking the medication. Please advise.  I would like to request  My last MRI result and the result from Two years ago. I would like to have the mailed to my home.

## 2013-04-05 NOTE — Telephone Encounter (Signed)
Call to patient and reviewed Dr. Zannie Cove responses. I counseled him on how to do gentle stretching neck exercises. He asked that I recommend something for when he does have headaches. I let him know I can not make recommendations but, whatever he would use generally over-the-counter. Should he have increased headaches then give [place] a call back.   Patient thanked me for my time.

## 2013-04-05 NOTE — Telephone Encounter (Signed)
Please call him back with answers to each questions  1.  I am not sure about his scalp condition, he should talk with his PCP or see a dermatologist for it. 2.   He might have cervical degenerative disease, if he does not have severe neck pain, he can do neck stretching exercise. 3.  No, less likely pineal cyst is symptomatic, it is a very small cyst 4. He should ask his pcp about sinusitis, if he is asymptomatic, he should not be treated for it. 6,  The pineal cyst is benign 7. It is ok for him to stop nortriptyline

## 2013-04-05 NOTE — Telephone Encounter (Signed)
Patient is calling with a few concerns. He is wondering if they are associated with patients head condition or anything in his head.   1. Patient has half a dozen pits on scalp. They flake off and then he has the pits again.   2. Patient also has a squeaking/grinding (like glass rubbing against each other or the sound of washing window with windex and paper towel and when it starts to get dry it squeaks) sensation when moves head. This is periodically through the day.   3.  Is patient at high risk of seizures or stroke with this pineal cyst.  4. Also, patient has a history of sinusitis. But is not being treated for that.   5. Is this cyst considered small, average or large compared to others.  6. Can this type of benign cyst become malignant.  7. Patient states he had to stop the nortryptaline because it just wiped him out too much. Due to the low Vit D he is already tired. I discussed that he may need to give this some time in order for these side effects to resolve. He feels it is too much with his other medical conditions.    Please advise.

## 2013-04-13 NOTE — Progress Notes (Signed)
Quick Note:  Spoke to patient and relayed MRI brain results. Patient would like to know if there is anything that needs to be done about the pineal cyst or if it is something to be concerned about? ______

## 2013-04-19 ENCOUNTER — Telehealth: Payer: Self-pay | Admitting: Neurology

## 2013-04-19 NOTE — Telephone Encounter (Signed)
Message copied by Levert Feinstein on Wed Apr 19, 2013  3:45 PM ------      Message from: [place], [place] T      Created: Thu Apr 13, 2013  3:10 PM       Spoke to patient and relayed MRI brain results.  Patient would like to know if there is anything that needs to be done about the pineal cyst or if it is something to be concerned about? ------

## 2013-04-19 NOTE — Telephone Encounter (Signed)
Please call patient, MRI brain showed no change, nothing needs to be done for that pineal cyst

## 2013-04-21 NOTE — Telephone Encounter (Signed)
Spoke to patient and relayed that nothing needs to be done about pineal cyst.

## 2013-06-19 ENCOUNTER — Telehealth: Payer: Self-pay | Admitting: Oncology

## 2013-06-19 ENCOUNTER — Ambulatory Visit (HOSPITAL_BASED_OUTPATIENT_CLINIC_OR_DEPARTMENT_OTHER): Payer: BC Managed Care – PPO | Admitting: Lab

## 2013-06-19 ENCOUNTER — Ambulatory Visit (HOSPITAL_BASED_OUTPATIENT_CLINIC_OR_DEPARTMENT_OTHER): Payer: BC Managed Care – PPO | Admitting: Oncology

## 2013-06-19 VITALS — BP 131/83 | HR 121 | Temp 98.8°F | Resp 20 | Ht 71.0 in | Wt 157.4 lb

## 2013-06-19 DIAGNOSIS — M19029 Primary osteoarthritis, unspecified elbow: Secondary | ICD-10-CM

## 2013-06-19 DIAGNOSIS — D66 Hereditary factor VIII deficiency: Secondary | ICD-10-CM

## 2013-06-19 DIAGNOSIS — B1920 Unspecified viral hepatitis C without hepatic coma: Secondary | ICD-10-CM

## 2013-06-19 DIAGNOSIS — M1710 Unilateral primary osteoarthritis, unspecified knee: Secondary | ICD-10-CM

## 2013-06-19 LAB — COMPREHENSIVE METABOLIC PANEL (CC13)
ALT: 10 U/L (ref 0–55)
AST: 17 U/L (ref 5–34)
Albumin: 4.7 g/dL (ref 3.5–5.0)
Alkaline Phosphatase: 72 U/L (ref 40–150)
Anion Gap: 12 mEq/L — ABNORMAL HIGH (ref 3–11)
BUN: 17.2 mg/dL (ref 7.0–26.0)
CO2: 24 mEq/L (ref 22–29)
Calcium: 10.2 mg/dL (ref 8.4–10.4)
Chloride: 103 mEq/L (ref 98–109)
Creatinine: 0.9 mg/dL (ref 0.7–1.3)
Glucose: 117 mg/dl (ref 70–140)
Potassium: 3.9 mEq/L (ref 3.5–5.1)
Sodium: 140 mEq/L (ref 136–145)
Total Bilirubin: 0.63 mg/dL (ref 0.20–1.20)
Total Protein: 8.1 g/dL (ref 6.4–8.3)

## 2013-06-19 NOTE — Progress Notes (Signed)
   [facility]    OFFICE PROGRESS NOTE   INTERVAL HISTORY:   He returns for a scheduled followup visit. He reports no recent joint bleed. He feels "depressed "secondary to the break up with his girlfriend. He is not eating well secondary to the depression. He continues to have limited range of motion and discomfort at multiple joints.  Objective:  Vital signs in last 24 hours:  Blood pressure 131/83, pulse 121, temperature 98.8 F (37.1 C), temperature source Oral, resp. rate 20, height 5\' 11"  (1.803 m), weight 157 lb 6.4 oz (71.396 kg). repeat manual pulse 104-108    Resp: Lungs clear bilaterally Cardio: Regular rate and rhythm GI: No hepatosplenomegaly, nontender Vascular: No leg edema Musculoskeletal: Limited range of motion at the right elbow and bilateral ankle  Skin: Spider telangiectasias at the upper chest and back    Medications: I have reviewed the patient's current medications.  Assessment/Plan: 1. 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  2. History of hepatitis C infection. Negative hepatitis C RNA in February of 2014 3. Chronic left ankle and right elbow arthropathy. 4. Celiac disease.  5. History of vitamin D deficiency.  6. History of hypertension.  7. 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  8. diagnosis of "scoliosis."  9. Diagnosis of a "pineal "cyst in September 2012, evaluated by neurology    Disposition:  He appears stable from a hematologic standpoint. He requested we check a factor VIII level, vitamin D level, liver panel, and quantitative hepatitis C titer today. Mr. Leete will return for an office visit in one year. He will continue followup with his primary physician for management of depression.   Thornton Papas, MD  06/19/2013  1:32 PM

## 2013-06-19 NOTE — Telephone Encounter (Signed)
gv and printed appt sched and avs for pt for NOV 2015....sent pt to lab

## 2013-06-21 LAB — VITAMIN D 25 HYDROXY (VIT D DEFICIENCY, FRACTURES): Vit D, 25-Hydroxy: 64 ng/mL (ref 30–89)

## 2013-06-21 LAB — FACTOR 8 ASSAY: Coagulation Factor VIII: 8 % — ABNORMAL LOW (ref 73–140)

## 2013-06-21 LAB — HEPATITIS C RNA QUANTITATIVE: HCV Quantitative: NOT DETECTED IU/mL (ref <15)

## 2013-07-13 ENCOUNTER — Encounter: Payer: Self-pay | Admitting: Oncology

## 2013-07-13 NOTE — Progress Notes (Signed)
Faxed physician form to [facility] @ [phone]; mailed original to patient.

## 2013-07-20 ENCOUNTER — Encounter (INDEPENDENT_AMBULATORY_CARE_PROVIDER_SITE_OTHER): Payer: Self-pay

## 2013-07-20 ENCOUNTER — Ambulatory Visit (INDEPENDENT_AMBULATORY_CARE_PROVIDER_SITE_OTHER): Payer: BC Managed Care – PPO | Admitting: Nurse Practitioner

## 2013-07-20 ENCOUNTER — Telehealth: Payer: Self-pay | Admitting: *Deleted

## 2013-07-20 ENCOUNTER — Encounter: Payer: Self-pay | Admitting: Nurse Practitioner

## 2013-07-20 VITALS — BP 124/73 | HR 99 | Ht 71.0 in | Wt 164.0 lb

## 2013-07-20 DIAGNOSIS — R51 Headache: Secondary | ICD-10-CM

## 2013-07-20 DIAGNOSIS — D66 Hereditary factor VIII deficiency: Secondary | ICD-10-CM

## 2013-07-20 NOTE — Progress Notes (Signed)
[facility]  PATIENT: Darrell Graham DOB: 20-Jun-1966   REASON FOR VISIT: Followup for headaches  HISTORY OF PRESENT ILLNESS: Darrell Graham, 47 year old white male returns for follow up. She was last seen in this office 03/23/2013 Dr. Terrace Arabia. He was placed on nortriptyline at that time as a preventive medication for his headaches however he only took the medication for 2 days and said he had side effects. He continues to complain of headache behind his eyes and a pressure sensation. He denies having seasonal allergies. He does have some ringing in the ears He tells me he is trying to get disability which was denied  before for his hepatitis C.    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  He returned today continued to complains of constant headaches, 6 out of 10, pressure sensation, as if his eyes going to popping mild, getting worse when he lays down on his pillow, he denies dysphagia, no dysarthria, no visual change, he also complains of gait difficulty because of bilateral knee pain, legs gave out underneath him, his balance are off easily. He continued to combat Anxiety.     REVIEW OF SYSTEMS: Full 14 system review of systems performed and notable only for those listed, all others are neg:  Constitutional: Fatigue, weight change    Cardiovascular: N/A  Ear/Nose/Throat: Ringing in the ears Skin: N/A  Eyes: Light sensitivity Respiratory: N/A  Gastroitestinal: N/A  Hematology/Lymphatic: Easy bruising Endocrine: N/A Musculoskeletal: Joint pain, back pain, aching muscles, coordination problems Allergy/Immunology: Environmental allergies and food allergies Neurological: Headache Psychiatric: Decreased concentration, depression, anxiety Sleep restless legs, insomnia, daytime sleepiness   ALLERGIES: Allergies  Allergen Reactions  . Codeine   . Tetracyclines & Related     HOME MEDICATIONS: Outpatient Prescriptions Prior to Visit  Medication Sig Dispense Refill  . amLODipine (NORVASC) 2.5 MG tablet Take 2.5 mg by mouth Daily.      Marland Kitchen Antihemophilic Factor rAHF-PAF (XYNTHA SOLOFUSE) 3000 UNITS KIT Inject into the vein as needed.      Marland Kitchen CALCIUM ASPARTATE PO Take by mouth daily.      . Dapsone (ACZONE EX) Apply topically.      Marland Kitchen EXFORGE 5-160 MG per tablet Take 1 tablet by mouth Daily.      Marland Kitchen FLUoxetine (PROZAC) 10 MG capsule Take 10 mg by mouth daily.      . hydrochlorothiazide (MICROZIDE) 12.5 MG capsule Take 12.5 mg by mouth Daily.      Marland Kitchen HYDROcodone-acetaminophen (LORTAB) 7.5-500 MG per tablet Take 1 tablet by mouth as needed.      Marland Kitchen HYDROCORTISONE, TOPICAL, 2 % LOTN Apply topically.      . lidocaine (LIDODERM) 5 % Place 1 patch onto the skin as needed. Remove & Discard patch within 12 hours or as directed by MD      . Multiple Vitamins-Minerals (MULTIVITAMIN PO) Take 1 tablet by mouth daily.      Marland Kitchen  nortriptyline (PAMELOR) 10 MG capsule Take 2 capsules (20 mg total) by mouth at bedtime. One po qhs xone week  60 capsule  12  . OMEPRAZOLE PO Take 1 capsule by mouth daily.      . sodium chloride (SALINE MIST) 0.65 % nasal spray Place 1 spray into the nose as needed for congestion.      . B Complex Vitamins (B COMPLEX 1) tablet Take 1 tablet by mouth daily.      . Vitamin D, Ergocalciferol, (DRISDOL) 50000 UNITS CAPS  capsule Take 50,000 Units by mouth daily.       No facility-administered medications prior to visit.    PAST MEDICAL HISTORY: Past Medical History  Diagnosis Date  . Hemophilia A   . Hepatitis C virus     recent viral titer negative  . Glaucoma   . Deviated nasal septum   . Allergy   . Asthma   . Scoliosis   . Celiac disease   . Chronic arthropathy     left ankle & right elbow  . H/O vitamin D deficiency   . Telangiectasias     upper back and chest  . High blood pressure   . Diverticulitis   . Hernia     PAST SURGICAL HISTORY: Past Surgical History  Procedure Laterality Date  . Ankle arthroscopy  1996    FAMILY HISTORY: Family History  Problem Relation Age of Onset  . Hemophilia    . Hepatitis C    . Osteopenia    . Psoriasis    . GER disease      SOCIAL HISTORY: History   Social History  . Marital Status: Single    Spouse Name: N/A    Number of Children: 0  . Years of Education: 12   Occupational History  .      Not working at this time.   Social History Main Topics  . Smoking status: Never Smoker   . Smokeless tobacco: Never Used  . Alcohol Use: No  . Drug Use: No  . Sexual Activity: Not on file   Other Topics Concern  . Not on file   Social History Narrative   Patient is single and lives at home with his mother. Patient has a high school education.   Caffeine- one daily.   Right handed.     PHYSICAL EXAM  Filed Vitals:   07/20/13 1344  BP: 124/73  Pulse: 99  Height: 5\' 11"  (1.803 m)  Weight: 164 lb (74.39 kg)   Body mass index is 22.88 kg/(m^2).  Generalized: Well developed, in no acute distress  Head bil fluid both ears mild Musculoskeletal: No deformity   Neurological examination   Mentation: Alert oriented to time, place, history taking. Follows all commands speech and language fluent  Cranial nerve II-XII: Pupils were equal round reactive to light extraocular movements were full, visual field were full on  confrontational test. Facial sensation and strength were normal. hearing was intact to finger rubbing bilaterally. Uvula tongue midline. head turning and shoulder shrug were normal and symmetric.Tongue protrusion into cheek strength was normal. Motor: normal bulk and tone, full strength in the BUE, BLE, fine finger movements normal, no pronator drift. No focal weakness Sensory: normal and symmetric to light touch, pinprick, and  vibration  Coordination: finger-nose-finger, heel-to-shin bilaterally, no dysmetria Reflexes: Brachioradialis 2/2, biceps 2/2, triceps 2/2, patellar 2/2, Achilles 2/2, plantar responses were flexor bilaterally. Gait and Station: Rising up from seated position without assistance, normal stance,  moderate stride, good arm swing, smooth turning, able to perform tiptoe, and heel walking without difficulty. Tandem gait is steady  DIAGNOSTIC DATA (LABS, IMAGING, TESTING) - None to review   ASSESSMENT AND PLAN  47 y.o. year old male  has a past medical history of Hemophilia A; Hepatitis C virus; Glaucoma; Deviated nasal septum; Allergy; Asthma; Scoliosis; Celiac disease; Chronic arthropathy; H/O vitamin D deficiency; Telangiectasias; High blood pressure; Diverticulitis; and Hernia. here to followup for headache. He tried nortriptyline as a preventive but  had side effects. He does have mild amount of fluid right greater than left both ears  Try OTC allegra , take as directed on the bottle If continued pressure sensation after several weeks after allegra,  call the office and Topamax will be started. Given information on migraine triggers.  He is not disabled from his headaches. He is trying to get disability that has been denied in the past.   F/U 6 months Nilda Riggs, [facility], [facility], APRN  [facility] [address] [place], [place] [address] [phone]

## 2013-07-20 NOTE — Patient Instructions (Signed)
Try OTC allegra , take as directed on the bottle If continued pressure sensation after several weeks call and Topamax will be started. Given information on migraine triggers.  F/U 6 months

## 2013-08-18 ENCOUNTER — Other Ambulatory Visit: Payer: Self-pay | Admitting: Family Medicine

## 2013-08-18 DIAGNOSIS — R109 Unspecified abdominal pain: Secondary | ICD-10-CM

## 2013-08-22 ENCOUNTER — Other Ambulatory Visit: Payer: BC Managed Care – PPO

## 2013-10-10 ENCOUNTER — Telehealth: Payer: Self-pay | Admitting: *Deleted

## 2013-10-10 MED ORDER — TOPIRAMATE 25 MG PO CPSP: ORAL_CAPSULE | ORAL | Status: DC

## 2013-10-10 NOTE — Telephone Encounter (Signed)
TC to patient after discussing with Dr.Yan, no need for Neuro Surgery  for cyst . It has not grown since 2012. Begin Topamax patient given instructions. Called in

## 2013-10-10 NOTE — Telephone Encounter (Signed)
I called pt and he relayed that he has had headaches (L temple area) between L eye and ear, daily for the last 2 wks.   States that even hurts when lays down. Feels like they are debilitating, can't focus.   OTC antihistamine did not really do anything.  He is ready to start topamax as per last note.  [facility].  Also wanted to see if could have on standby the recommendation of NS for pineal cyst.  His cell # [phone].

## 2013-11-03 ENCOUNTER — Telehealth: Payer: Self-pay | Admitting: Neurology

## 2013-11-03 NOTE — Telephone Encounter (Signed)
I spoke with patient. Dr. Terrace ArabiaYan had advised not to treat this pineal cyst surgically in the past. Will defer this decision to her when she gets back. -VRP

## 2013-11-03 NOTE — Telephone Encounter (Signed)
Pt called.  He would like to discuss with someone what he would need to do to get a referral to a [facility].  He has a pineal cyst and wants to explore avenues for getting it removed.  He is aware that Dr. Terrace ArabiaYan is out of the office, but would still like someone to contact him.  Thank you.

## 2013-11-15 NOTE — Telephone Encounter (Signed)
Chart reviewed, Darrell HarmanDana: Please call patient, I do not think he needs further treatment about his pineal cysts, if he still worries about his situation, he may contact his primary care physician, potential refer to neurosurgeon

## 2013-11-16 NOTE — Telephone Encounter (Signed)
Called patient and spoke to patient he will contact his primary care doctor he is worried about pineal cysts. Patient will still keep his follow up appt. With Dr.Yan in June. Patient also understood about neurosurgeon. Patient will hold off on referral for now.

## 2014-01-10 ENCOUNTER — Telehealth: Payer: Self-pay

## 2014-01-10 ENCOUNTER — Encounter: Payer: Self-pay | Admitting: Neurology

## 2014-01-10 NOTE — Telephone Encounter (Signed)
Called and left patient a message on his cell and with his mother to reschedule his appt. For 01-22-2014. We can move patient up or reschedule for another day.

## 2014-01-16 ENCOUNTER — Ambulatory Visit (INDEPENDENT_AMBULATORY_CARE_PROVIDER_SITE_OTHER): Payer: BC Managed Care – PPO | Admitting: Neurology

## 2014-01-16 ENCOUNTER — Encounter (INDEPENDENT_AMBULATORY_CARE_PROVIDER_SITE_OTHER): Payer: Self-pay

## 2014-01-16 ENCOUNTER — Encounter: Payer: Self-pay | Admitting: Neurology

## 2014-01-16 VITALS — BP 137/82 | HR 89 | Ht 71.0 in | Wt 178.0 lb

## 2014-01-16 DIAGNOSIS — R51 Headache: Secondary | ICD-10-CM

## 2014-01-16 DIAGNOSIS — D66 Hereditary factor VIII deficiency: Secondary | ICD-10-CM

## 2014-01-16 MED ORDER — NORTRIPTYLINE HCL 10 MG PO CAPS: 10.0000 mg | ORAL_CAPSULE | Freq: Every day | ORAL | Status: DC

## 2014-01-16 MED ORDER — RIZATRIPTAN BENZOATE 5 MG PO TBDP: 5.0000 mg | ORAL_TABLET | ORAL | Status: DC | PRN

## 2014-01-16 NOTE — Progress Notes (Signed)
[facility]  PATIENT: CHIDI SHIRER DOB: 1965/11/23   HISTORY OF PRESENT ILLNESS: Mr. Vantol, 48 year old white male returns for follow up. She was last seen in this office 03/23/2013 Dr. Krista Blue. He was placed on nortriptyline at that time as a preventive medication for his headaches however he only took the medication for 2 days and said he had side effects. He continues to complain of headache behind his eyes and a pressure sensation. He denies having seasonal allergies. He does have some ringing in the ears He tells me he is trying to get disability which was denied  before for his hepatitis C.  HISTORY: He had past medical history of hemophilia, receiving factor VIII transfusion occasionally, has knee joints bleeding sometimes, he also had a history of hepatitis C, was treated with interferon in the past,   I saw him in 2012 for mild abnormal MRI of the brain which was taken to complains of frequent headaches, stiff neck, fluid sensation in his head and face, difficulty swallowing, anxiety, heart palpitations, difficulty swallowing, pain in extremities,   Repeat MRI in 2014,  MRI scan of the brain showing a few scattered tiny nonspecific white matter hyperintensities supratentorially with the differential discussed above. 1.6 x 1x 0.7 cm pineal region cyst is noted. Overall no significant changes compared with previous MRI scan dated 04/08/2011.  He returned today continued to complains of constant headaches, 6 out of 10, pressure sensation, as if his eyes going to popping mild, getting worse when he lays down on his pillow, he denies dysphagia, no dysarthria, no visual change, he also complains of gait difficulty because of bilateral knee pain, legs gave out underneath him, his balance are off easily. He continued to combat Anxiety.   UPDATE June 16th 2015:  He has tried Topamax, could not tolerate the side effect of drowsiness, Allegra did not help either,  He now  complains of daily left skull pain, sensitive to touch, he felt left side pressure, pulled downwards, occasionally dull headache, certain days, his headache is much worse, associated with bad headaches, which can last for 2 hours, with associated light, noise sensitivity, nauseous,   He is apparently very concerned about the small pineal cyst gland, wondering that it has caused his frequent headaches, and all the other symptoms he complained about, despite multiple explanation, less likely his headache, and other symptoms is related to his small pineal cyst, which has no signs of compression, mass effect at repeat MRI of the brain, in addition, he has hemophilia,   REVIEW OF SYSTEMS: Full 14 system review of systems performed and notable only for those listed, all others are neg: Frequent headaches, skull sensitivity,     ALLERGIES: Allergies  Allergen Reactions  . Codeine   . Tetracyclines & Related     HOME MEDICATIONS: Outpatient Prescriptions Prior to Visit  Medication Sig Dispense Refill  . amLODipine (NORVASC) 2.5 MG tablet Take 2.5 mg by mouth Daily.      Marland Kitchen Antihemophilic Factor rAHF-PAF (XYNTHA SOLOFUSE) 3000 UNITS KIT Inject into the vein as needed.      Marland Kitchen CALCIUM ASPARTATE PO Take by mouth daily.      . Dapsone (ACZONE EX) Apply topically.      Marland Kitchen EXFORGE 5-160 MG per tablet Take 1 tablet by mouth Daily.      Marland Kitchen FLUoxetine (PROZAC) 10 MG capsule Take 10 mg by mouth daily.      . hydrochlorothiazide (MICROZIDE) 12.5 MG capsule Take 12.5 mg  by mouth Daily.      Marland Kitchen HYDROCORTISONE, TOPICAL, 2 % LOTN Apply topically.      . lidocaine (LIDODERM) 5 % Place 1 patch onto the skin as needed. Remove & Discard patch within 12 hours or as directed by MD      . Multiple Vitamins-Minerals (MULTIVITAMIN PO) Take 1 tablet by mouth daily.      Marland Kitchen OMEPRAZOLE PO Take 1 capsule by mouth daily.      . sodium chloride (SALINE MIST) 0.65 % nasal spray Place 1 spray into the nose as needed for congestion.       . topiramate (TOPAMAX) 25 MG capsule 1 po hs for 1 week, increase by 23m weekly  until dose is 1071mor 2caps twice daily  120 capsule  6   No facility-administered medications prior to visit.    PAST MEDICAL HISTORY: Past Medical History  Diagnosis Date  . Hemophilia A   . Hepatitis C virus     recent viral titer negative  . Glaucoma   . Deviated nasal septum   . Allergy   . Asthma   . Scoliosis   . Celiac disease   . Chronic arthropathy     left ankle & right elbow  . H/O vitamin D deficiency   . Telangiectasias     upper back and chest  . High blood pressure   . Diverticulitis   . Hernia     PAST SURGICAL HISTORY: Past Surgical History  Procedure Laterality Date  . Ankle arthroscopy  1996    FAMILY HISTORY: Family History  Problem Relation Age of Onset  . Hemophilia    . Hepatitis C    . Osteopenia    . Psoriasis    . GER disease      SOCIAL HISTORY: History   Social History  . Marital Status: Single    Spouse Name: N/A    Number of Children: 0  . Years of Education: 12   Occupational History  .      Not working at this time.   Social History Main Topics  . Smoking status: Never Smoker   . Smokeless tobacco: Never Used  . Alcohol Use: No  . Drug Use: No  . Sexual Activity: Not on file   Other Topics Concern  . Not on file   Social History Narrative   Patient is single and lives at home with his mother. Patient has a high school education.   Caffeine- one daily.   Right handed.     PHYSICAL EXAM  Filed Vitals:   01/16/14 1500  BP: 137/82  Pulse: 89  Height: _0  (1.803 m)  Weight: 178 lb (80.74 kg)   Body mass index is 24.84 kg/(m^2).  Generalized: Well developed, in no acute distress  Head bil fluid both ears mild Musculoskeletal: No deformity   Neurological examination   Mentation: Alert oriented to time, place, history taking. Follows all commands speech and language fluent  Cranial nerve II-XII: Pupils were  equal round reactive to light extraocular movements were full, visual field were full on confrontational test. Facial sensation and strength were normal. hearing was intact to finger rubbing bilaterally. Uvula tongue midline. head turning and shoulder shrug were normal and symmetric.Tongue protrusion into cheek strength was normal. Motor: normal bulk and tone, full strength in the BUE, BLE, fine finger movements normal, no pronator drift. No focal weakness Sensory: normal and symmetric to light touch, pinprick, and  vibration  Coordination: finger-nose-finger,  heel-to-shin bilaterally, no dysmetria Reflexes: Brachioradialis 2/2, biceps 2/2, triceps 2/2, patellar 2/2, Achilles 2/2, plantar responses were flexor bilaterally. Gait and Station: Rising up from seated position without assistance, normal stance,  moderate stride, good arm swing, smooth turning, able to perform tiptoe, and heel walking without difficulty. Tandem gait is steady  DIAGNOSTIC DATA (LABS, IMAGING, TESTING) - None to review   ASSESSMENT AND PLAN  48 y.o. year old male  has a past medical history of Hemophilia A; Hepatitis C virus; Glaucoma; Deviated nasal septum; Allergy; Asthma; Scoliosis; Celiac disease; Chronic arthropathy; H/O vitamin D deficiency; Telangiectasias; High blood pressure; Diverticulitis; and Hernia. here to followup for headache. With mild migraine features,  Will try low-dose nortriptyline, 10 mg every day, as preventive medications, Maxalt as needed Refer him to [facility] neurologist Only return to clinic if needed   Marcial Pacas, M.D. Ph.D.  [facility] [address] [place], [place] [address] [phone]

## 2014-01-19 ENCOUNTER — Ambulatory Visit: Payer: BC Managed Care – PPO | Admitting: Neurology

## 2014-01-22 ENCOUNTER — Ambulatory Visit: Payer: BC Managed Care – PPO | Admitting: Neurology

## 2014-02-20 ENCOUNTER — Other Ambulatory Visit: Payer: Self-pay | Admitting: Family Medicine

## 2014-02-20 ENCOUNTER — Inpatient Hospital Stay: Admission: RE | Admit: 2014-02-20 | Payer: BC Managed Care – PPO | Source: Ambulatory Visit

## 2014-02-20 DIAGNOSIS — S0990XA Unspecified injury of head, initial encounter: Secondary | ICD-10-CM

## 2014-03-02 NOTE — Telephone Encounter (Signed)
Patient's appt was moved up to another, was scheduled 01/16/14,

## 2014-03-30 ENCOUNTER — Ambulatory Visit: Payer: BC Managed Care – PPO | Admitting: Neurology

## 2014-04-30 ENCOUNTER — Other Ambulatory Visit: Payer: Self-pay | Admitting: *Deleted

## 2014-04-30 DIAGNOSIS — D66 Hereditary factor VIII deficiency: Secondary | ICD-10-CM

## 2014-04-30 MED ORDER — ANTIHEM FACT (BDD-RFVIII,MOR) 3000 UNITS IV KIT: 3000.0000 [IU] | PACK | INTRAVENOUS | Status: DC | PRN

## 2014-04-30 NOTE — Addendum Note (Signed)
Addended by: Arvilla Meres on: 04/30/2014 01:54 PM   Modules accepted: Orders

## 2014-04-30 NOTE — Telephone Encounter (Signed)
THIS REFILL REQUEST FOR XYNTHA WAS PLACED ON DR.SHERRILL'S DESK.

## 2014-06-01 ENCOUNTER — Other Ambulatory Visit: Payer: Self-pay | Admitting: Dermatology

## 2014-06-18 ENCOUNTER — Ambulatory Visit (HOSPITAL_BASED_OUTPATIENT_CLINIC_OR_DEPARTMENT_OTHER): Payer: BC Managed Care – PPO | Admitting: Oncology

## 2014-06-18 ENCOUNTER — Telehealth: Payer: Self-pay | Admitting: Oncology

## 2014-06-18 VITALS — BP 127/83 | HR 90 | Temp 98.2°F | Resp 18 | Ht 71.0 in | Wt 169.4 lb

## 2014-06-18 DIAGNOSIS — D66 Hereditary factor VIII deficiency: Secondary | ICD-10-CM

## 2014-06-18 DIAGNOSIS — I781 Nevus, non-neoplastic: Secondary | ICD-10-CM

## 2014-06-18 DIAGNOSIS — M419 Scoliosis, unspecified: Secondary | ICD-10-CM

## 2014-06-18 DIAGNOSIS — M129 Arthropathy, unspecified: Secondary | ICD-10-CM

## 2014-06-18 DIAGNOSIS — K900 Celiac disease: Secondary | ICD-10-CM

## 2014-06-18 NOTE — Telephone Encounter (Signed)
Pt confirmed MD visit per 11/16 POF, gave pt AVS.... KJ  °

## 2014-06-18 NOTE — Progress Notes (Signed)
  [facility] OFFICE PROGRESS NOTE   Diagnosis: hemophilia A  INTERVAL HISTORY:   [name] returns as scheduled. He reports no recent bleed requiring factor replacement. He continues to have stiffness and pain at the right elbow and left ankle. This is progressive. He is now followed by Dr. Kateri PlummerMorrow for primary care. He reports recent negative hepatitis C testing.  Objective:  Vital signs in last 24 hours:  Blood pressure 127/83, pulse 90, temperature 98.2 F (36.8 C), temperature source Oral, resp. rate 18, height 5\' 11"  (1.803 m), weight 169 lb 6.4 oz (76.839 kg), SpO2 100 %.   Resp: lungs clear bilaterally Cardio: regular rate and rhythm GI: no hepatosplenomegaly Vascular: no leg edema  Skin:spider telangiectasias of the upper chest and back Musculoskeletal: Limited range of motion at the right elbow and left ankle. No edema.   Medications: I have reviewed the patient's current medications.  Assessment/Plan: 1. Hemophilia A - he will continuehome treatment with factor VIII as needed.  2. History of hepatitis C infection. Negative hepatitis C RNA in November 2014 3. Chronic left ankle and right elbow arthropathy. 4. Celiac disease.  5. History of vitamin D deficiency.  6. History of hypertension.  7. 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  8. diagnosis of "scoliosis."  9. Diagnosis of a "pineal "cyst in September 2012, evaluated by neurology     Disposition:  [name] appears stable. He will contact us if he develops a significant joint bleeds. He will return for an office visit in one year. I feel he qualifies for medical disability based on the hemophilia related arthropathy.  Thornton PapasSHERRILL, Conchetta Lamia, MD  06/18/2014  3:38 PM

## 2015-01-14 ENCOUNTER — Ambulatory Visit: Payer: Self-pay | Admitting: Neurology

## 2015-01-28 ENCOUNTER — Ambulatory Visit: Payer: Self-pay | Admitting: Neurology

## 2015-01-30 ENCOUNTER — Telehealth: Payer: Self-pay | Admitting: Neurology

## 2015-01-30 ENCOUNTER — Telehealth: Payer: Self-pay

## 2015-01-30 NOTE — Telephone Encounter (Signed)
Patient returned call to r/s his appt from 7/7. He is requesting to switch Dr's from Dr Terrace ArabiaYan to Dr Anne HahnWillis which has been approved by Marylu LundJanet. He is stating this is the 2nd time his appt has been r/s with Dr Anne HahnWillis. He is requesting lunch time and  afternoon appointments. Please call to work him in the schedule. Patient can be reached at [phone].

## 2015-01-30 NOTE — Telephone Encounter (Signed)
I called the patient. I gave a brief description of what was going on and apologized for all the changes. He stated he understood. Appointment scheduled for 7/25 at 3 PM. Patient requested to be called if an earlier appointment became available. I promised him I would certainly do this.

## 2015-01-30 NOTE — Telephone Encounter (Signed)
Left voicemail asking the patient to call back to r/s 7/7 appointment d/t Dr. Anne HahnWillis not being in the office that afternoon. Patient can be scheduled in any New Patient spot.

## 2015-01-30 NOTE — Telephone Encounter (Signed)
Appointment scheduled 7/25

## 2015-02-06 ENCOUNTER — Telehealth: Payer: Self-pay

## 2015-02-06 NOTE — Telephone Encounter (Signed)
I called and spoke to the patient's mother. I left a message with her stating that Dr. Anne HahnWillis has an open appointment at 2:30 today, if he would like to take it. She stated he was close by and she would have him call me right back to let me know.

## 2015-02-07 ENCOUNTER — Ambulatory Visit: Payer: Self-pay | Admitting: Neurology

## 2015-02-25 ENCOUNTER — Ambulatory Visit (INDEPENDENT_AMBULATORY_CARE_PROVIDER_SITE_OTHER): Payer: BLUE CROSS/BLUE SHIELD | Admitting: Neurology

## 2015-02-25 ENCOUNTER — Encounter: Payer: Self-pay | Admitting: Neurology

## 2015-02-25 VITALS — BP 130/84 | HR 88 | Ht 70.0 in | Wt 167.2 lb

## 2015-02-25 DIAGNOSIS — G44221 Chronic tension-type headache, intractable: Secondary | ICD-10-CM | POA: Diagnosis not present

## 2015-02-25 MED ORDER — GABAPENTIN 100 MG PO CAPS: 100.0000 mg | ORAL_CAPSULE | Freq: Three times a day (TID) | ORAL | Status: DC
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
## 2015-02-25 NOTE — Patient Instructions (Signed)

## 2015-03-22 ENCOUNTER — Ambulatory Visit: Payer: BLUE CROSS/BLUE SHIELD | Attending: Neurology | Admitting: Rehabilitative and Restorative Service Providers"

## 2015-03-22 ENCOUNTER — Ambulatory Visit: Payer: Self-pay | Admitting: Rehabilitative and Restorative Service Providers"

## 2015-03-22 DIAGNOSIS — R293 Abnormal posture: Secondary | ICD-10-CM | POA: Insufficient documentation

## 2015-03-22 DIAGNOSIS — M542 Cervicalgia: Secondary | ICD-10-CM

## 2015-03-22 NOTE — Patient Instructions (Signed)
NECK: Therapist, occupational Down: Tuck chin, push back of neck into pillow. _5-10__ reps per set, __2_ sets per day, __5_ days per week  Copyright  VHI. All rights reserved.  Flexors, Supine   Lie on back, head on small, rolled towel. Tip chin down. Tighten muscles in back of throat. Hold _5__ seconds. Repeat _5__ times per session. Do _2__ sessions per day.  Copyright  VHI. All rights reserved.   Neck Diagonal - Arms at Sides   Standing facing forward, arms at sides, turn head halfway to one side and move head diagonally to look at your shoulder. Hold each position _5___ seconds. Repeat on other side. Do __3__ repetitions,  1-2 times/day.  http://bt.exer.us/254   Copyright  VHI. All rights reserved.
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
## 2015-03-26 ENCOUNTER — Telehealth: Payer: Self-pay | Admitting: Neurology

## 2015-03-26 NOTE — Telephone Encounter (Signed)
Patient called stating gabapentin (NEURONTIN) 100 MG capsule is not helping. Please call and advise. Patient can be reached [phone].

## 2015-03-27 NOTE — Telephone Encounter (Signed)
I called the patient. He stated the gabapentin has not helped. He still feels "fidgety" all the time and wants to constantly dig at his scalp. He has been taking the gabapentin twice daily. He states he tried to take it three times daily but with his sleep scheduled, he was unable to. He wondered if the dosage should be increased for if he could try a different medication. He started therapy last week.

## 2015-03-27 NOTE — Telephone Encounter (Signed)
I called patient. He is on very low-dose of gabapentin taking 100 mg twice daily. I will have him go to taking 2 in the morning and 4 in the evening, if this does not seem to help, he is to contact our office, we'll switch him to the 300 mg tablets.

## 2015-04-10 ENCOUNTER — Ambulatory Visit: Payer: BLUE CROSS/BLUE SHIELD | Attending: Neurology | Admitting: Physical Therapy

## 2015-04-10 ENCOUNTER — Telehealth: Payer: Self-pay

## 2015-04-10 ENCOUNTER — Encounter: Payer: Self-pay | Admitting: Physical Therapy

## 2015-04-10 DIAGNOSIS — M542 Cervicalgia: Secondary | ICD-10-CM | POA: Diagnosis present

## 2015-04-10 DIAGNOSIS — R293 Abnormal posture: Secondary | ICD-10-CM | POA: Insufficient documentation

## 2015-04-10 MED ORDER — TIZANIDINE HCL 2 MG PO TABS: 2.0000 mg | ORAL_TABLET | Freq: Two times a day (BID) | ORAL | Status: DC

## 2015-04-10 NOTE — Therapy (Signed)
[facility] [address] [place], [place], [address] Phone: [phone]   Fax:  [phone]  Physical Therapy Treatment  Patient Details  Name: Darrell Graham MRN: [id number] Date of Birth: May 09, 1966 Referring Provider:  Farris Has, MD  Encounter Date: 04/10/2015   04/10/15 1410  PT Visits / Re-Eval  Visit Number 2  Number of Visits 8  Date for PT Re-Evaluation 04/22/15  Authorization  Authorization Type private insurance 30 visit limit  PT Time Calculation  PT Start Time 1405  PT Stop Time 1444  PT Time Calculation (min) 39 min  PT - End of Session  Activity Tolerance Patient tolerated treatment well  Behavior During Therapy [facility] for tasks assessed/performed     Past Medical History  Diagnosis Date  . Hemophilia A   . Hepatitis C virus     recent viral titer negative  . Glaucoma   . Deviated nasal septum   . Allergy   . Asthma   . Scoliosis   . Celiac disease   . Chronic arthropathy     left ankle & right elbow  . H/O vitamin D deficiency   . Telangiectasias     upper back and chest  . High blood pressure   . Diverticulitis   . Hernia     Past Surgical History  Procedure Laterality Date  . Ankle arthroscopy  1996    There were no vitals filed for this visit.  Visit Diagnosis:   Neck pain    Abnormal posture         Subjective Assessment - 04/10/15 1408    Subjective Has not been doing the HEP due to fatigue/sleeping all the time from Neurotin increased. Has stopped taking it due to this complication. Still with left sided pain/tightness.   Currently in Pain? Yes   Pain Score 5    Pain Location Neck   Pain Orientation Left   Pain Descriptors / Indicators Jabbing;Aching;Sore   Pain Type Chronic pain   Pain Onset More than a month ago   Pain Frequency Constant   Aggravating Factors  certain motions, repetitive motions   Pain Relieving Factors rest, less activity      Treatment: Manual therapy concurrent with moist hot pack. To left upper trap, rhomboids, cervical spine and STM. Soft tissue mobs Myofascial release Gentle cervical distraction Trigger point release Positional release  All to decrease pain and muscle tightness.  Exercise: Pt educated on gentle stretches for home. Refer to pt instructions for full details.        04/10/15 1440  PT Education  Education provided Yes  Education Details HEP: exercises for gentle stretching  Methods Explanation;Demonstration;Handout  Comprehension Verbalized understanding;Returned demonstration          PT Long Term Goals - 03/22/15 1539    PT LONG TERM GOAL #1   Title The patient will be independent with HEP for postural stabilization and strengthening.   Baseline Target date 04/22/2015   Time 4   Period Weeks   PT LONG TERM GOAL #2   Title The patient will decrease subjective report of pain to < or equal to 3/10 at rest in neck.   Baseline Target date 04/22/2015   Time 4   Period Weeks   PT LONG TERM GOAL #3   Title The pateint will decrease NDI from 36% to < or equal to 25% to demonstrate decreased subjective dizziness.   Baseline Target date 04/22/2015   Time 4  Period Weeks   PT LONG TERM GOAL #4   Title The patient will verbalize understanding of community wellness activities that are appropriate for general medical status (ankle issues, hemophilia, etc).   Baseline Target date 04/22/2015   Time 4   Period Weeks        04/10/15 1410  Plan  Clinical Impression Statement Manual therapy decreased pt's pain and tightness per his report. Pt also given stretches for home that will be easier for him to work into his daily routine after he reported "not really" doing the ones issued on eval due to no time. Pt making steady progress toward goals.   Pt will benefit from skilled therapeutic intervention in order to improve on the following deficits Decreased activity tolerance;Decreased  mobility;Pain;Increased muscle spasms;Increased fascial restricitons  Rehab Potential Good  PT Frequency 2x / week  PT Duration 4 weeks  PT Treatment/Interventions Moist Heat;Cryotherapy;Manual techniques;Patient/family education;Therapeutic activities;Therapeutic exercise;Neuromuscular re-education  PT Next Visit Plan Check HEP; patient improved with minimal extension vs. flexion today (did flexion in more neutral spine for stabilization); soft tissue mobilization with focus on L cervical region, general postural stabilization  Consulted and Agree with Plan of Care Patient    Problem List Patient Active Problem List   Diagnosis Date Noted  . Hemophilia 03/23/2013  . Headache 03/23/2013    Sallyanne Kuster 04/10/2015, 2:11 PM  Sallyanne Kuster, PTA, [facility] [address] [place], [place] [address] [phone] 04/10/2015, 2:11 PM

## 2015-04-10 NOTE — Telephone Encounter (Signed)
I called patient. The gabapentin resulted in too much drowsiness and fatigue. This seemed to help some with the headache. I will switch him over to tizanidine taking 2 mg twice daily. Previously, he had been on nortriptyline and Topamax without much benefit or tolerance.

## 2015-04-10 NOTE — Telephone Encounter (Signed)
Patient came in the office today. He wanted to let Dr. Anne Hahn know that he has stopped taking his Gabapentin. Ever since he increased the dose he was taking it made him too drowsy and suppressed his appetite. He would like know if there is another medication he could try.

## 2015-04-10 NOTE — Patient Instructions (Signed)
Roll   Slowly roll shoulder in backwards direction, rolling them together. Repeat _10__ times. Do _1-2_ times per day.  Copyright  VHI. All rights reserved.  Shoulder Blade Squeeze   In seated position, squeeze shoulder blades together. Hold for 5 seconds. Repeat __10__ times. Do __1-2__ sessions per day.  http://[email or web address].exer.us/846   Copyright  VHI. All rights reserved.  Shoulder Blade Squeeze   Rotate shoulders back, then squeeze shoulder blades together. Repeat ____ times. Do ____ sessions per day.  http://[email or web address].exer.us/846   Copyright  VHI. All rights reserved.  AROM: Lateral Neck Flexion   Slowly tilt head toward left shoulder. Hold position __10__ seconds. Repeat __3__ times per set. Do _1 sets per session. Do _1-2___ sessions per day.  http://orth.exer.us/296   Copyright  VHI. All rights reserved.  Upper Limb Neural Tension: Radial I   Place left arm across low back and turn head down toward other side. Gently increase stretch by pulling down on head and depressing shoulder girdle. Repeat __3__ times per set. Do _1__ sets per session. Do _1-2___ sessions per day.  http://orth.exer.us/408   Copyright  VHI. All rights reserved.

## 2015-04-12 ENCOUNTER — Ambulatory Visit: Payer: BLUE CROSS/BLUE SHIELD | Admitting: Rehabilitative and Restorative Service Providers"

## 2015-04-12 DIAGNOSIS — M542 Cervicalgia: Secondary | ICD-10-CM

## 2015-04-12 DIAGNOSIS — R293 Abnormal posture: Secondary | ICD-10-CM

## 2015-04-12 NOTE — Therapy (Signed)
[facility] [address] [place], [place], [address] Phone: [phone]   Fax:  [phone]  Physical Therapy Treatment  Patient Details  Name: JOEANTHONY SEELING MRN: [id number] Date of Birth: 10-May-1966 Referring Provider:  Farris Has, MD  Encounter Date: 04/12/2015      PT End of Session - 04/12/15 1553    Visit Number 3   Number of Visits 8   Date for PT Re-Evaluation 04/22/15   Authorization Type private insurance 30 visit limit   PT Start Time 1455   PT Stop Time 1535   PT Time Calculation (min) 40 min   Activity Tolerance Patient tolerated treatment well   Behavior During Therapy [facility] for tasks assessed/performed      Past Medical History  Diagnosis Date  . Hemophilia A   . Hepatitis C virus     recent viral titer negative  . Glaucoma   . Deviated nasal septum   . Allergy   . Asthma   . Scoliosis   . Celiac disease   . Chronic arthropathy     left ankle & right elbow  . H/O vitamin D deficiency   . Telangiectasias     upper back and chest  . High blood pressure   . Diverticulitis   . Hernia     Past Surgical History  Procedure Laterality Date  . Ankle arthroscopy  1996    There were no vitals filed for this visit.  Visit Diagnosis:  Neck pain  Abnormal posture      Subjective Assessment - 04/12/15 1458    Subjective The patient reports that he felt that he was nonfunctional after increasing dose of neurontin.  He is supposed to begin tizanidine for muscle relaxer to assist with symptoms.   Patient Stated Goals decrease head and neck pain   Currently in Pain? Yes   Pain Score 6    Pain Location Neck   Pain Orientation Left   Pain Descriptors / Indicators Aching;Sore   Pain Type Chronic pain   Pain Onset More than a month ago   Pain Frequency Constant   Aggravating Factors  movements   Pain Relieving Factors rest, less activity           [facility] Adult PT Treatment/Exercise -  04/12/15 1506    Exercises   Exercises Neck   Neck Exercises: Standing   Other Standing Exercises corner stretch for chest stretch x 5 repetitions   Neck Exercises: Seated   W Back 10 reps  seated on physioball   W Back Limitations Patient beging to elevate shoulders with fatigue, needs tactile cues for scapular depression   Shoulder Rolls 10 reps;Backwards  seated on physioball   Neck Exercises: Supine   Cervical Isometrics Left lateral flexion;Left rotation;Extension;3 secs  2 reps each direction followed by stretching   Cervical Isometrics Limitations after L isometric contraction performed L muscle elongation by sidebending, flexing and rotating to the right   Neck Retraction 5 reps   Cervical Rotation Right;Left;10 reps  initially with towel roll to encourage extension   Cervical Rotation Limitations removed towel roll and patient reports less pain  pain with end range L rotation with towel roll for extension   Neck Exercises: Sidelying   Lateral Flexion Right;Left;5 reps  with cues on maintaining straight plane mvmt   Lateral Flexion Limitations fatigue with repetition   Other Sidelying Exercise scapular mobilization R sidelying with L gentle relaxation, passive stretching into L scapular depression  Neck Exercises: Prone   Axial Exentsion 5 reps  axial extension>flexion ROM   Neck Retraction 10 reps  on elbows with cues on retraction   Manual Therapy   Manual Therapy Soft tissue mobilization;Manual Traction;Passive ROM;Scapular mobilization   Manual therapy comments patient initially with significant trigger points in L upper trap, distal L scalenes near first rib, and proximal scalene   Soft tissue mobilization to tolerance for L upper trap, parascapular musculature, scalenes, supraspinatus   Scapular Mobilization gentle P/ROM to relax musculature and scapular depression   Passive ROM overpressure with neck ROM   Manual Traction gentle traction to tolerance with  suboccipital release   Neck Exercises: Stretches   Upper Trapezius Stretch 3 reps  seated on physioball   Levator Stretch 3 reps  seated on physioball          PT Long Term Goals - 03/22/15 1539    PT LONG TERM GOAL #1   Title The patient will be independent with HEP for postural stabilization and strengthening.   Baseline Target date 04/22/2015   Time 4   Period Weeks   PT LONG TERM GOAL #2   Title The patient will decrease subjective report of pain to < or equal to 3/10 at rest in neck.   Baseline Target date 04/22/2015   Time 4   Period Weeks   PT LONG TERM GOAL #3   Title The pateint will decrease NDI from 36% to < or equal to 25% to demonstrate decreased subjective dizziness.   Baseline Target date 04/22/2015   Time 4   Period Weeks   PT LONG TERM GOAL #4   Title The patient will verbalize understanding of community wellness activities that are appropriate for general medical status (ankle issues, hemophilia, etc).   Baseline Target date 04/22/2015   Time 4   Period Weeks           Plan - 04/12/15 1553    Clinical Impression Statement The patient has palpable tightness that responded to passive stretching, soft tissue mobilization and stretching.  PT did not add further HEP, as patient is to begin prior exercises instructed.  He was hindered by recent reaction to neurontin per his report and has spoken with MD office with medication modification made.   PT Next Visit Plan Check HEP, postural stability (try quadriped), soft tissue mobilization, anterior chest wall stretch   Consulted and Agree with Plan of Care Patient        Problem List Patient Active Problem List   Diagnosis Date Noted  . Hemophilia 03/23/2013  . Headache 03/23/2013    Jaymon Dudek , PT 04/12/2015, 3:55 PM  [facility] [address] [place], [place], [address] Phone: [phone]   Fax:  [phone]

## 2015-04-16 ENCOUNTER — Encounter: Payer: Self-pay | Admitting: Rehabilitative and Restorative Service Providers"

## 2015-04-16 NOTE — Therapy (Signed)
[facility] [address] [place], [place], [address] Phone: [phone]   Fax:  [phone]  Patient Details  Name: Darrell Graham MRN: [id number] Date of Birth: 1966/06/04 Referring Provider:  No ref. provider found  Encounter Date: last encounter 04/12/2015  PHYSICAL THERAPY DISCHARGE SUMMARY  Visits from Start of Care: 3  Current functional level related to goals / functional outcomes:     PT Long Term Goals - 03/22/15 1539    PT LONG TERM GOAL #1   Title The patient will be independent with HEP for postural stabilization and strengthening.   Baseline Target date 04/22/2015   Time 4   Period Weeks   PT LONG TERM GOAL #2   Title The patient will decrease subjective report of pain to < or equal to 3/10 at rest in neck.   Baseline Target date 04/22/2015   Time 4   Period Weeks   PT LONG TERM GOAL #3   Title The pateint will decrease NDI from 36% to < or equal to 25% to demonstrate decreased subjective dizziness.   Baseline Target date 04/22/2015   Time 4   Period Weeks   PT LONG TERM GOAL #4   Title The patient will verbalize understanding of community wellness activities that are appropriate for general medical status (ankle issues, hemophilia, etc).   Baseline Target date 04/22/2015   Time 4   Period Weeks     **Goals not addressed as patient did not return for remaining visits.   Remaining deficits: See initial evaluation for patient status.  He was seen eval + 2 visits and then cancelled remaining visits with message that he would continue to do exercises on his own at home.   Education / Equipment: HEP, posture.  Plan: Patient agrees to discharge.  Patient goals were not met. Patient is being discharged due to not returning since the last visit.  ?????        Thank you for the referral of this patient. Rudell Cobb, MPT  Darrell Graham 04/16/2015, 9:51 AM  [facility] [address] [place], [place], [address] Phone: [phone]   Fax:  [phone]

## 2015-04-17 ENCOUNTER — Ambulatory Visit: Payer: BLUE CROSS/BLUE SHIELD | Admitting: Physical Therapy

## 2015-04-19 ENCOUNTER — Ambulatory Visit: Payer: BLUE CROSS/BLUE SHIELD | Admitting: Rehabilitative and Restorative Service Providers"

## 2015-04-24 ENCOUNTER — Ambulatory Visit: Payer: BLUE CROSS/BLUE SHIELD | Admitting: Physical Therapy

## 2015-04-24 ENCOUNTER — Encounter: Payer: Self-pay | Admitting: Oncology

## 2015-04-24 NOTE — Progress Notes (Signed)
I placed psi form on desk of nurse for dr. Truett Perna.

## 2015-04-25 ENCOUNTER — Encounter: Payer: Self-pay | Admitting: Oncology

## 2015-04-25 NOTE — Progress Notes (Signed)
I faxed forms to psi  [phone]

## 2015-04-26 ENCOUNTER — Ambulatory Visit: Payer: BLUE CROSS/BLUE SHIELD | Admitting: Physical Therapy

## 2015-04-30 ENCOUNTER — Telehealth: Payer: Self-pay | Admitting: *Deleted

## 2015-04-30 NOTE — Telephone Encounter (Signed)
Message from pt asking if Dr. Truett Perna has received his insurance forms. Informed pt forms were faxed back to [employer] on 9/22, per note in chart. Left message for Raquel in managed care to mail originals to pt (at his request).

## 2015-04-30 NOTE — Telephone Encounter (Signed)
Voicemail "requesting return call for form.  Called yesterday.  Dropped off a week ago.  Need this sent to me to continue my health insurance.  I have a timely deadline.  Please call to verify [phone]."

## 2015-05-01 ENCOUNTER — Encounter: Payer: Self-pay | Admitting: Oncology

## 2015-05-01 NOTE — Progress Notes (Signed)
Per nurse, the patient wants copy of psi formed mailed. I mailed to patient.

## 2015-06-18 ENCOUNTER — Ambulatory Visit (HOSPITAL_BASED_OUTPATIENT_CLINIC_OR_DEPARTMENT_OTHER): Payer: BLUE CROSS/BLUE SHIELD | Admitting: Oncology

## 2015-06-18 ENCOUNTER — Telehealth: Payer: Self-pay | Admitting: Oncology

## 2015-06-18 VITALS — BP 154/94 | HR 81 | Temp 99.4°F | Resp 18 | Ht 70.0 in | Wt 170.8 lb

## 2015-06-18 DIAGNOSIS — I10 Essential (primary) hypertension: Secondary | ICD-10-CM

## 2015-06-18 DIAGNOSIS — B1920 Unspecified viral hepatitis C without hepatic coma: Secondary | ICD-10-CM

## 2015-06-18 DIAGNOSIS — D66 Hereditary factor VIII deficiency: Secondary | ICD-10-CM | POA: Diagnosis not present

## 2015-06-18 NOTE — Telephone Encounter (Signed)
per pof to sch pt appt-gave pt copy of avs °

## 2015-06-18 NOTE — Progress Notes (Signed)
  [facility] OFFICE PROGRESS NOTE   Diagnosis: Hemophilia A  INTERVAL HISTORY:   [name] returns as scheduled. He reports no recent bleeding. He cannot remember the last time he had a bleed. He has pain at the central toes of the right foot and reports being diagnosed with a "Morton's "neuroma. He is followed at [facility]. He was also treated for a trigger finger in the left hand. This improved after injection therapy. He continues to have limited range of motion at the left ankle and right elbow.    Objective:  Vital signs in last 24 hours:  Blood pressure 154/94, pulse 81, temperature 99.4 F (37.4 C), temperature source Oral, resp. rate 18, height 5\' 10"  (1.778 m), weight 170 lb 12.8 oz (77.474 kg), SpO2 99 %.   Resp: Lungs clear bilaterally Cardio: Regular rate and rhythm GI: No hepatomegaly, no apparent ascites Vascular: No leg edema  Skin: Spider telangiectasias at the upper chest and face Musculoskeletal: Limited range of motion at the left ankle and right elbow. Small ecchymosis over the middle toe of the right foot(he reports no apparent joint bleed in the right toes )    Medications: I have reviewed the patient's current medications.  Assessment/Plan: 1. 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  2. History of hepatitis C infection. Negative hepatitis C RNA in November 2014 3. Chronic left ankle and right elbow arthropathy. 4. Celiac disease.  5. History of vitamin D deficiency.  6. History of hypertension.  7. 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  8. diagnosis of "scoliosis."  9. Diagnosis of a "pineal "cyst in September 2012, evaluated by neurology     Disposition:  [name] appears stable. He requested we check a hepatitis C viral titer and a blood test for liver function today. Our lab was closed when he was seen this afternoon. He is scheduled to see Dr. Kateri PlummerMorrow later this  week and will request laboratory studies be obtained there. He will return for an office visit here in one year. I am available to see him in the interim as needed. He will be sure the hypertension is addressed with Dr. Kateri PlummerMorrow.  Thornton PapasSHERRILL, Kelee Cunningham, MD  06/18/2015  4:56 PM

## 2015-07-01 ENCOUNTER — Ambulatory Visit (INDEPENDENT_AMBULATORY_CARE_PROVIDER_SITE_OTHER): Payer: BLUE CROSS/BLUE SHIELD | Admitting: Neurology

## 2015-07-01 ENCOUNTER — Encounter: Payer: Self-pay | Admitting: Neurology

## 2015-07-01 VITALS — BP 147/93 | HR 79 | Ht 70.0 in | Wt 173.0 lb

## 2015-07-01 DIAGNOSIS — G44221 Chronic tension-type headache, intractable: Secondary | ICD-10-CM

## 2015-07-01 DIAGNOSIS — F429 Obsessive-compulsive disorder, unspecified: Secondary | ICD-10-CM

## 2015-07-01 HISTORY — DX: Obsessive-compulsive disorder, unspecified: F42.9

## 2015-07-01 MED ORDER — FLUOXETINE HCL 10 MG PO CAPS: 10.0000 mg | ORAL_CAPSULE | Freq: Every day | ORAL | Status: DC

## 2015-07-01 NOTE — Progress Notes (Signed)
Reason for visit:  headache  Darrell Graham is an 49 y.o. male  History of present illness:  Mr. Lange is a 49 year old right-handed white male with a history of hemophilia A, and a history of chronic headache. The patient reported a pressure sensation, but this seems to have improved as time has gone on. The patient was unable to tolerate gabapentin, he was switched to tizanidine, but he never took the medication. The patient has developed some neck discomfort, primarily on the left side. He also has had a trigger finger on the left middle finger. This was treated with a steroid injection. He has done well with this. He also reports a right Morton's neuroma on the foot. The patient has developed an obsessive-compulsive behavior involving scratching the head on the left side. The left arm is affected. The patient cannot seem to suppress the urge to scratch the head. He does have an underlying anxiety disorder as well.  Past Medical History  Diagnosis Date  . Hemophilia A ([place])   . Hepatitis C virus     recent viral titer negative  . Glaucoma   . Deviated nasal septum   . Allergy   . Asthma   . Scoliosis   . Celiac disease   . Chronic arthropathy     left ankle & right elbow  . H/O vitamin D deficiency   . Telangiectasias     upper back and chest  . High blood pressure   . Diverticulitis   . Hernia   . OCD (obsessive compulsive disorder) 07/01/2015    Past Surgical History  Procedure Laterality Date  . Ankle arthroscopy  1996    Family History  Problem Relation Age of Onset  . Hemophilia    . Hepatitis C    . Osteopenia    . Psoriasis    . GER disease    . Dementia Mother   . Migraines Father   . Stroke Father   . Heart disease Father     Social history:  reports that he has never smoked. He has never used smokeless tobacco. He reports that he does not drink alcohol or use illicit drugs.    Allergies  Allergen Reactions  . Codeine   . Tetracyclines &  Related     Medications:  Prior to Admission medications   Medication Sig Start Date End Date Taking? Authorizing Provider  ALPRAZolam Duanne Moron) 0.25 MG tablet Take 0.25 mg by mouth at bedtime as needed for anxiety.   Yes Historical Provider, MD  Antihemophilic Factor rAHF-PAF (XYNTHA SOLOFUSE) 3000 UNITS KIT Inject 3,000 Units into the vein as needed. 04/30/14  Yes Ladell Pier, MD  cholecalciferol (VITAMIN D) 1000 UNITS tablet Take 1,000 Units by mouth as needed (Patient states he has celiac disease and takes this medication more in the winter). Takes 5000 units daily   Yes Historical Provider, MD  Dapsone (ACZONE EX) Apply topically.   Yes Historical Provider, MD  HYDROCORTISONE, TOPICAL, 2 % LOTN Apply topically.   Yes Jarome Matin, MD  Multiple Vitamins-Minerals (MULTIVITAMIN PO) Take 1 tablet by mouth daily.   Yes Historical Provider, MD  tiZANidine (ZANAFLEX) 2 MG tablet Take 1 tablet (2 mg total) by mouth 2 (two) times daily. 04/10/15  Yes Kathrynn Ducking, MD  amLODipine (NORVASC) 5 MG tablet Take 5 mg by mouth daily.    Historical Provider, MD  hydrochlorothiazide (MICROZIDE) 12.5 MG capsule Take 12.5 mg by mouth Daily. 10/27/11   Historical  Provider, MD  valsartan (DIOVAN) 160 MG tablet 160 mg daily. 12/22/13   Historical Provider, MD    ROS:  Out of a complete 14 system review of symptoms, the patient complains only of the following symptoms, and all other reviewed systems are negative.  Fatigue Ringing in the ears Eyes discharge, eye itching, light sensitivity Cold intolerance, heat intolerance, excessive thirst Diarrhea Restless legs, insomnia, daytime sleepiness Environmental allergies, food allergies Joint pain, back pain, ecchymosis, neck pain, neck stiffness Skin rash, itching Bruising easily Memory loss, headache, numbness, weakness Agitation, decreased concentration, depression, anxiety  Blood pressure 147/93, pulse 79, height _0  (1.778 m), weight 173 lb (78.472  kg).  Physical Exam  General: The patient is alert and cooperative at the time of the examination.  Neuromuscular: Range of movement of the cervical spine is full.  Skin: No significant peripheral edema is noted.   Neurologic Exam  Mental status: The patient is alert and oriented x 3 at the time of the examination. The patient has apparent normal recent and remote memory, with an apparently normal attention span and concentration ability.   Cranial nerves: Facial symmetry is present. Speech is normal, no aphasia or dysarthria is noted. Extraocular movements are full. Visual fields are full.  Motor: The patient has good strength in all 4 extremities.  Sensory examination: Soft touch sensation is symmetric on the face, arms, and legs.  Coordination: The patient has good finger-nose-finger and heel-to-shin bilaterally.  Gait and station: The patient has a normal gait. Tandem gait is normal. Romberg is negative. No drift is seen.  Reflexes: Deep tendon reflexes are symmetric.   Assessment/Plan:  One. Head pressure, improved  2. Obsessive compulsive disorder  The patient will be given a trial on Prozac for his underlying anxiety and OCD behavior disorder. The patient has done better with the pressure sensation in the head. He will followup in 5 or 6 months. He will contact me over the next one month if dose increases of the Prozac are required.  Jill Alexanders MD 07/01/2015 6:19 PM  [facility] [address] [place], [place] [address]  Phone [phone] Fax [phone]

## 2015-07-01 NOTE — Patient Instructions (Signed)
   We will start prozac at the 10 mg dose. After 3 or 4 weeks, if you are tolerating the medication, but need a higher dose, call our office.  Migraine Headache A migraine headache is an intense, throbbing pain on one or both sides of your head. A migraine can last for 30 minutes to several hours. CAUSES  The exact cause of a migraine headache is not always known. However, a migraine may be caused when nerves in the brain become irritated and release chemicals that cause inflammation. This causes pain. Certain things may also trigger migraines, such as:  Alcohol.  Smoking.  Stress.  Menstruation.  Aged cheeses.  Foods or drinks that contain nitrates, glutamate, aspartame, or tyramine.  Lack of sleep.  Chocolate.  Caffeine.  Hunger.  Physical exertion.  Fatigue.  Medicines used to treat chest pain (nitroglycerine), birth control pills, estrogen, and some blood pressure medicines. SIGNS AND SYMPTOMS  Pain on one or both sides of your head.  Pulsating or throbbing pain.  Severe pain that prevents daily activities.  Pain that is aggravated by any physical activity.  Nausea, vomiting, or both.  Dizziness.  Pain with exposure to bright lights, loud noises, or activity.  General sensitivity to bright lights, loud noises, or smells. Before you get a migraine, you may get warning signs that a migraine is coming (aura). An aura may include:  Seeing flashing lights.  Seeing bright spots, halos, or zigzag lines.  Having tunnel vision or blurred vision.  Having feelings of numbness or tingling.  Having trouble talking.  Having muscle weakness. DIAGNOSIS  A migraine headache is often diagnosed based on:  Symptoms.  Physical exam.  A CT scan or MRI of your head. These imaging tests cannot diagnose migraines, but they can help rule out other causes of headaches. TREATMENT Medicines may be given for pain and nausea. Medicines can also be given to help prevent  recurrent migraines.  HOME CARE INSTRUCTIONS  Only take over-the-counter or prescription medicines for pain or discomfort as directed by your health care provider. The use of long-term narcotics is not recommended.  Lie down in a dark, quiet room when you have a migraine.  Keep a journal to find out what may trigger your migraine headaches. For example, write down:  What you eat and drink.  How much sleep you get.  Any change to your diet or medicines.  Limit alcohol consumption.  Quit smoking if you smoke.  Get 7-9 hours of sleep, or as recommended by your health care provider.  Limit stress.  Keep lights dim if bright lights bother you and make your migraines worse. SEEK IMMEDIATE MEDICAL CARE IF:   Your migraine becomes severe.  You have a fever.  You have a stiff neck.  You have vision loss.  You have muscular weakness or loss of muscle control.  You start losing your balance or have trouble walking.  You feel faint or pass out.  You have severe symptoms that are different from your first symptoms. MAKE SURE YOU:   Understand these instructions.  Will watch your condition.  Will get help right away if you are not doing well or get worse.   This information is not intended to replace advice given to you by your health care provider. Make sure you discuss any questions you have with your health care provider.   Document Released: 07/20/2005 Document Revised: 08/10/2014 Document Reviewed: 03/27/2013 Elsevier Interactive Patient Education [employer]2016 Elsevier Inc.

## 2015-07-02 ENCOUNTER — Telehealth: Payer: Self-pay | Admitting: Neurology

## 2015-07-02 MED ORDER — VENLAFAXINE HCL ER 37.5 MG PO CP24: 37.5000 mg | ORAL_CAPSULE | Freq: Every day | ORAL | Status: DC

## 2015-07-02 NOTE — Telephone Encounter (Signed)
I called the patient. He cannot tolerate the prozac. I will call in low dose Effexor.

## 2015-07-02 NOTE — Telephone Encounter (Signed)
Patient called regarding FLUoxetine (PROZAC) 10 MG capsule, states he's taken it before and can't tolerate it, has also tried zoloft and it doesn't work either. Needs a new medicine besides prozac and zoloft.

## 2015-07-02 NOTE — Telephone Encounter (Signed)
I called the patient. He states that both Prozac and Zoloft make him extremely drowsy. He would like to know if Dr. Anne HahnWillis can prescribe something different.

## 2015-07-15 ENCOUNTER — Telehealth: Payer: Self-pay | Admitting: *Deleted

## 2015-07-15 NOTE — Telephone Encounter (Signed)
Message from pt requesting to change prescription to Advate. No longer wants to use [place]. Requesting Advate Rx to be sent to [facility] pharmacy. Also requesting Dr. Truett PernaSherrill to send a statement informing [employer] of this change.  Fax [phone] Phone [phone]. Will review with MD.

## 2015-07-15 NOTE — Telephone Encounter (Signed)
Call from pt reporting his health insurance will no longer be paid by [employer]. He is requesting a refill on Xyntha before his coverage ends this month. Pt went on to say. [employer] may be able to continue assisting him if he switches brands of clotting factor. Asks if Dr. Truett PernaSherrill would consider switching brands. Instructed him to call us with the details for Dr. Truett PernaSherrill to review. He voiced understanding.

## 2015-07-22 ENCOUNTER — Other Ambulatory Visit: Payer: Self-pay | Admitting: *Deleted

## 2015-07-22 DIAGNOSIS — D66 Hereditary factor VIII deficiency: Secondary | ICD-10-CM

## 2015-07-22 MED ORDER — ANTIHEM FACT (BDD-RFVIII,MOR) 3000 UNITS IV KIT: 3000.0000 [IU] | PACK | INTRAVENOUS | Status: AC | PRN

## 2015-08-04 HISTORY — PX: BLADDER SURGERY: SHX569

## 2015-09-04 DIAGNOSIS — N390 Urinary tract infection, site not specified: Secondary | ICD-10-CM

## 2015-09-04 HISTORY — DX: Urinary tract infection, site not specified: N39.0

## 2015-09-22 ENCOUNTER — Inpatient Hospital Stay (HOSPITAL_COMMUNITY)
Admission: EM | Admit: 2015-09-22 | Discharge: 2015-09-27 | DRG: 871 | Disposition: A | Discharge status: Home / Self Care | Payer: BLUE CROSS/BLUE SHIELD | Attending: Internal Medicine | Admitting: Internal Medicine

## 2015-09-22 ENCOUNTER — Encounter (HOSPITAL_COMMUNITY): Payer: Self-pay | Admitting: Nurse Practitioner

## 2015-09-22 ENCOUNTER — Emergency Department (HOSPITAL_COMMUNITY): Payer: BLUE CROSS/BLUE SHIELD

## 2015-09-22 DIAGNOSIS — R310 Gross hematuria: Secondary | ICD-10-CM | POA: Diagnosis present

## 2015-09-22 DIAGNOSIS — F429 Obsessive-compulsive disorder, unspecified: Secondary | ICD-10-CM | POA: Diagnosis present

## 2015-09-22 DIAGNOSIS — R5383 Other fatigue: Secondary | ICD-10-CM | POA: Diagnosis present

## 2015-09-22 DIAGNOSIS — R3989 Other symptoms and signs involving the genitourinary system: Secondary | ICD-10-CM | POA: Diagnosis present

## 2015-09-22 DIAGNOSIS — B1920 Unspecified viral hepatitis C without hepatic coma: Secondary | ICD-10-CM | POA: Diagnosis present

## 2015-09-22 DIAGNOSIS — K5732 Diverticulitis of large intestine without perforation or abscess without bleeding: Secondary | ICD-10-CM | POA: Diagnosis present

## 2015-09-22 DIAGNOSIS — A419 Sepsis, unspecified organism: Secondary | ICD-10-CM | POA: Diagnosis present

## 2015-09-22 DIAGNOSIS — G44221 Chronic tension-type headache, intractable: Secondary | ICD-10-CM | POA: Diagnosis not present

## 2015-09-22 DIAGNOSIS — F419 Anxiety disorder, unspecified: Secondary | ICD-10-CM | POA: Diagnosis present

## 2015-09-22 DIAGNOSIS — N390 Urinary tract infection, site not specified: Secondary | ICD-10-CM | POA: Diagnosis present

## 2015-09-22 DIAGNOSIS — M545 Low back pain: Secondary | ICD-10-CM | POA: Diagnosis present

## 2015-09-22 DIAGNOSIS — R51 Headache: Secondary | ICD-10-CM | POA: Diagnosis present

## 2015-09-22 DIAGNOSIS — N1330 Unspecified hydronephrosis: Secondary | ICD-10-CM | POA: Diagnosis present

## 2015-09-22 DIAGNOSIS — Z823 Family history of stroke: Secondary | ICD-10-CM | POA: Diagnosis not present

## 2015-09-22 DIAGNOSIS — I10 Essential (primary) hypertension: Secondary | ICD-10-CM | POA: Diagnosis present

## 2015-09-22 DIAGNOSIS — N321 Vesicointestinal fistula: Secondary | ICD-10-CM | POA: Diagnosis present

## 2015-09-22 DIAGNOSIS — Z8249 Family history of ischemic heart disease and other diseases of the circulatory system: Secondary | ICD-10-CM | POA: Diagnosis not present

## 2015-09-22 DIAGNOSIS — R519 Headache, unspecified: Secondary | ICD-10-CM | POA: Diagnosis present

## 2015-09-22 DIAGNOSIS — D62 Acute posthemorrhagic anemia: Secondary | ICD-10-CM | POA: Diagnosis present

## 2015-09-22 DIAGNOSIS — B9620 Unspecified Escherichia coli [E. coli] as the cause of diseases classified elsewhere: Secondary | ICD-10-CM | POA: Diagnosis present

## 2015-09-22 DIAGNOSIS — D66 Hereditary factor VIII deficiency: Secondary | ICD-10-CM | POA: Diagnosis present

## 2015-09-22 HISTORY — DX: Urinary tract infection, site not specified: N39.0

## 2015-09-22 LAB — CBC WITH DIFFERENTIAL/PLATELET
Basophils Absolute: 0 10*3/uL (ref 0.0–0.1)
Basophils Relative: 0 %
Eosinophils Absolute: 0 10*3/uL (ref 0.0–0.7)
Eosinophils Relative: 0 %
HCT: 37.5 % — ABNORMAL LOW (ref 39.0–52.0)
Hemoglobin: 12.2 g/dL — ABNORMAL LOW (ref 13.0–17.0)
Lymphocytes Relative: 8 %
Lymphs Abs: 1.5 10*3/uL (ref 0.7–4.0)
MCH: 29.5 pg (ref 26.0–34.0)
MCHC: 32.5 g/dL (ref 30.0–36.0)
MCV: 90.8 fL (ref 78.0–100.0)
Monocytes Absolute: 1.5 10*3/uL — ABNORMAL HIGH (ref 0.1–1.0)
Monocytes Relative: 8 %
Neutro Abs: 16 10*3/uL — ABNORMAL HIGH (ref 1.7–7.7)
Neutrophils Relative %: 84 %
Platelets: 447 10*3/uL — ABNORMAL HIGH (ref 150–400)
RBC: 4.13 MIL/uL — ABNORMAL LOW (ref 4.22–5.81)
RDW: 12.7 % (ref 11.5–15.5)
WBC: 19 10*3/uL — ABNORMAL HIGH (ref 4.0–10.5)

## 2015-09-22 LAB — URINALYSIS, ROUTINE W REFLEX MICROSCOPIC
Bilirubin Urine: NEGATIVE
Glucose, UA: NEGATIVE mg/dL
Ketones, ur: 15 mg/dL — AB
Nitrite: NEGATIVE
Protein, ur: 100 mg/dL — AB
Specific Gravity, Urine: 1.023 (ref 1.005–1.030)
pH: 5.5 (ref 5.0–8.0)

## 2015-09-22 LAB — BASIC METABOLIC PANEL
Anion gap: 13 (ref 5–15)
BUN: 12 mg/dL (ref 6–20)
CO2: 22 mmol/L (ref 22–32)
Calcium: 9.5 mg/dL (ref 8.9–10.3)
Chloride: 100 mmol/L — ABNORMAL LOW (ref 101–111)
Creatinine, Ser: 0.99 mg/dL (ref 0.61–1.24)
GFR calc Af Amer: >60 mL/min (ref >60)
GFR calc non Af Amer: >60 mL/min (ref >60)
Glucose, Bld: 155 mg/dL — ABNORMAL HIGH (ref 65–99)
Potassium: 4 mmol/L (ref 3.5–5.1)
Sodium: 135 mmol/L (ref 135–145)

## 2015-09-22 LAB — I-STAT CG4 LACTIC ACID, ED: Lactic Acid, Venous: 2.78 mmol/L (ref 0.5–2.0)

## 2015-09-22 LAB — URINE MICROSCOPIC-ADD ON
Bacteria, UA: NONE SEEN
Squamous Epithelial / LPF: NONE SEEN

## 2015-09-22 MED ORDER — ALPRAZOLAM 0.25 MG PO TABS: 0.2500 mg | ORAL_TABLET | Freq: Every evening | ORAL | Status: DC | PRN

## 2015-09-22 MED ORDER — ACETAMINOPHEN 325 MG PO TABS
650.0000 mg | ORAL_TABLET | Freq: Once | ORAL | Status: AC | PRN
  Administered 2015-09-22: 650 mg via ORAL

## 2015-09-22 MED ORDER — SODIUM CHLORIDE 0.9 % IV BOLUS (SEPSIS)
1000.0000 mL | Freq: Once | INTRAVENOUS | Status: AC
  Administered 2015-09-22: 1000 mL via INTRAVENOUS

## 2015-09-22 MED ORDER — ACETAMINOPHEN 325 MG PO TABS
ORAL_TABLET | ORAL | Status: AC
  Filled 2015-09-22: qty 2

## 2015-09-22 MED ORDER — SODIUM CHLORIDE 0.9 % IV BOLUS (SEPSIS)
500.0000 mL | Freq: Once | INTRAVENOUS | Status: AC
  Administered 2015-09-23: 500 mL via INTRAVENOUS

## 2015-09-22 MED ORDER — VENLAFAXINE HCL ER 37.5 MG PO CP24
37.5000 mg | ORAL_CAPSULE | Freq: Every day | ORAL | Status: DC
  Filled 2015-09-22 (×5): qty 1

## 2015-09-22 MED ORDER — METRONIDAZOLE IN NACL 5-0.79 MG/ML-% IV SOLN
500.0000 mg | Freq: Three times a day (TID) | INTRAVENOUS | Status: DC
  Administered 2015-09-23 – 2015-09-24 (×5): 500 mg via INTRAVENOUS
  Filled 2015-09-22 (×5): qty 100

## 2015-09-22 MED ORDER — ACETAMINOPHEN 325 MG PO TABS
650.0000 mg | ORAL_TABLET | Freq: Four times a day (QID) | ORAL | Status: DC | PRN
  Administered 2015-09-23 – 2015-09-25 (×6): 650 mg via ORAL
  Filled 2015-09-22 (×6): qty 2

## 2015-09-22 MED ORDER — CIPROFLOXACIN IN D5W 400 MG/200ML IV SOLN
400.0000 mg | Freq: Two times a day (BID) | INTRAVENOUS | Status: DC
  Administered 2015-09-23 (×3): 400 mg via INTRAVENOUS
  Filled 2015-09-22 (×3): qty 200

## 2015-09-22 MED ORDER — HYDROCODONE-ACETAMINOPHEN 5-325 MG PO TABS: 1.0000 | ORAL_TABLET | ORAL | Status: DC | PRN

## 2015-09-22 MED ORDER — HYDROMORPHONE HCL 1 MG/ML IJ SOLN: 0.5000 mg | INTRAMUSCULAR | Status: DC | PRN

## 2015-09-22 MED ORDER — DEXTROSE 5 % IV SOLN: 1.0000 g | INTRAVENOUS | Status: DC

## 2015-09-22 MED ORDER — FENTANYL CITRATE (PF) 100 MCG/2ML IJ SOLN: 25.0000 ug | Freq: Once | INTRAMUSCULAR | Status: DC

## 2015-09-22 MED ORDER — ACETAMINOPHEN 650 MG RE SUPP: 650.0000 mg | Freq: Four times a day (QID) | RECTAL | Status: DC | PRN

## 2015-09-22 MED ORDER — DEXTROSE 5 % IV SOLN
2.0000 g | Freq: Once | INTRAVENOUS | Status: AC
  Administered 2015-09-22: 2 g via INTRAVENOUS
  Filled 2015-09-22: qty 2

## 2015-09-22 NOTE — ED Notes (Signed)
Patient transported to CT 

## 2015-09-22 NOTE — ED Provider Notes (Signed)
CSN: [id number]     Arrival date & time 09/22/15  1608 History   First MD Initiated Contact with Patient 09/22/15 1754     Chief Complaint  Patient presents with  . Hematuria     (Consider location/radiation/quality/duration/timing/severity/associated sxs/prior Treatment) HPI   Patient is a 49-year-old male with history of hemophilia A presenting with hematuria for last several days as well as fever and fatigue. Patient reports suprapubic pain for last several days. He then developed fever, chills.  Patient had trouble urinating on Friday. That has since resolved.      Past Medical History  Diagnosis Date  . Hemophilia A (HCC)   . Hepatitis C virus     recent viral titer negative  . Glaucoma   . Deviated nasal septum   . Allergy   . Asthma   . Scoliosis   . Celiac disease   . Chronic arthropathy     left ankle & right elbow  . H/O vitamin D deficiency   . Telangiectasias     upper back and chest  . High blood pressure   . Diverticulitis   . Hernia   . OCD (obsessive compulsive disorder) 07/01/2015   Past Surgical History  Procedure Laterality Date  . Ankle arthroscopy  1996   Family History  Problem Relation Age of Onset  . Hemophilia    . Hepatitis C    . Osteopenia    . Psoriasis    . GER disease    . Dementia Mother   . Migraines Father   . Stroke Father   . Heart disease Father    Social History  Substance Use Topics  . Smoking status: Never Smoker   . Smokeless tobacco: Never Used  . Alcohol Use: No    Review of Systems  Constitutional: Negative for activity change.  HENT: Negative for congestion.   Respiratory: Negative for shortness of breath.   Cardiovascular: Negative for chest pain.  Gastrointestinal: Positive for nausea and abdominal pain.  Genitourinary: Positive for hematuria.  Musculoskeletal: Negative for arthralgias.  Neurological: Positive for light-headedness. Negative for dizziness.  Psychiatric/Behavioral: Negative for  agitation.      Allergies  Hemophilia support; Codeine; Gluten meal; Tetracyclines & related; and Wheat bran  Home Medications   Prior to Admission medications   Medication Sig Start Date End Date Taking? Authorizing Provider  acetaminophen (TYLENOL) 325 MG tablet Take 650 mg by mouth daily as needed for mild pain, fever or headache.   Yes Historical Provider, MD  ALPRAZolam (XANAX) 0.25 MG tablet Take 0.25 mg by mouth at bedtime as needed for anxiety.   Yes Historical Provider, MD  amLODipine (NORVASC) 5 MG tablet Take 5 mg by mouth every evening.    Yes Historical Provider, MD  Antihemophilic Factor rAHF-PAF (XYNTHA SOLOFUSE) 3000 UNITS KIT Inject 3,000 Units into the vein as needed. Patient taking differently: Inject 3,000 Units into the vein as needed (for bleeding).  07/22/15  Yes Gary B Sherrill, MD  cholecalciferol (VITAMIN D) 1000 UNITS tablet Take 1,000 Units by mouth every evening.    Yes Historical Provider, MD  Dapsone (ACZONE EX) Apply 1 application topically daily as needed (for acne).    Yes Historical Provider, MD  Multiple Vitamins-Minerals (MULTIVITAMIN PO) Take 1 tablet by mouth daily.   Yes Historical Provider, MD  Naphazoline-Pheniramine (OPCON-A) 0.027-0.315 % SOLN Apply 1 drop to eye daily as needed (for dry eyes).   Yes Historical Provider, MD  valsartan (DIOVAN) 160 MG tablet 160   mg daily. 12/22/13  Yes Historical Provider, MD  venlafaxine XR (EFFEXOR XR) 37.5 MG 24 hr capsule Take 1 capsule (37.5 mg total) by mouth daily with breakfast. 07/02/15  Yes Kathrynn Ducking, MD   BP 128/75 mmHg  Pulse 108  Temp(Src) 101.5 F (38.6 C) (Oral)  Resp 18  Ht 5' 10" (1.778 m)  Wt 156 lb 5 oz (70.903 kg)  BMI 22.43 kg/m2  SpO2 97% Physical Exam  Constitutional: He is oriented to person, place, and time. He appears well-nourished.  HENT:  Head: Normocephalic.  Mouth/Throat: Oropharynx is clear and moist.  Eyes: Conjunctivae are normal.  Neck: No tracheal deviation  present.  Cardiovascular: Normal rate.   Pulmonary/Chest: Effort normal. No stridor. No respiratory distress.  Abdominal: Soft. There is no guarding.  Mild tenderness  Musculoskeletal: Normal range of motion. He exhibits no edema.  Neurological: He is oriented to person, place, and time. No cranial nerve deficit.  Skin: Skin is warm and dry. No rash noted. He is not diaphoretic.  Psychiatric: He has a normal mood and affect. His behavior is normal.  Nursing note and vitals reviewed.   ED Course  Procedures (including critical care time) Labs Review Labs Reviewed  URINALYSIS, ROUTINE W REFLEX MICROSCOPIC (NOT AT [facility]) - Abnormal; Notable for the following:    Color, Urine AMBER (*)    APPearance TURBID (*)    Hgb urine dipstick LARGE (*)    Ketones, ur 15 (*)    Protein, ur 100 (*)    Leukocytes, UA MODERATE (*)    All other components within normal limits  BASIC METABOLIC PANEL - Abnormal; Notable for the following:    Chloride 100 (*)    Glucose, Bld 155 (*)    All other components within normal limits  CBC WITH DIFFERENTIAL/PLATELET - Abnormal; Notable for the following:    WBC 19.0 (*)    RBC 4.13 (*)    Hemoglobin 12.2 (*)    HCT 37.5 (*)    Platelets 447 (*)    Neutro Abs 16.0 (*)    Monocytes Absolute 1.5 (*)    All other components within normal limits  I-STAT CG4 LACTIC ACID, ED - Abnormal; Notable for the following:    Lactic Acid, Venous 2.78 (*)    All other components within normal limits  URINE CULTURE  CULTURE, BLOOD (ROUTINE X 2)  CULTURE, BLOOD (ROUTINE X 2)  URINE MICROSCOPIC-ADD ON  LACTIC ACID, PLASMA  LACTIC ACID, PLASMA  COMPREHENSIVE METABOLIC PANEL  CBC  HCV RNA QUANT    Imaging Review Ct Renal Stone Study  09/22/2015  CLINICAL DATA:  Hematuria. Urinary tract infection. Evaluate for urolithiasis. Urinary pain for 1 month. EXAM: CT ABDOMEN AND PELVIS WITHOUT CONTRAST TECHNIQUE: Multidetector CT imaging of the abdomen and pelvis was performed  following the standard protocol without IV contrast. COMPARISON:  CT 12/01/2005 FINDINGS: Lower chest: The included lung bases are clear. No pleural effusion. Liver: No focal lesion allowing for lack contrast. Hepatobiliary: Gallbladder physiologically distended, no calcified stone. No biliary dilatation. Pancreas: No ductal dilatation or inflammation. Spleen: Normal. Adrenal glands: No nodule. Kidneys: Mild bilateral hydronephrosis. Both ureters are dilated to the bladder insertion, right greater than left. No urolithiasis. Focal cortical scarring in the mid upper right kidney. Stomach/Bowel: Stomach physiologically distended. There are no dilated or thickened small bowel loops. Wall thickening and stranding about the sigmoid colon with multiple colonic diverticula. This is in the region of prior acute diverticulitis on remote prior exam.  Fat plane between the sigmoid colon and left urinary bladder dome is obscured, there is associated bladder wall thickening. No free air or extraluminal abscess. The appendix is normal. Vascular/Lymphatic: No retroperitoneal adenopathy. Abdominal aorta is normal in caliber. Reproductive: Prostate gland appears prominent in age measuring 4.9 cm transverse dimension. Seminal vesicles are prominent. Bladder: Mildly distended with wall thickening involving the posterior left aspect. Wall thickening measures up to 1.5 cm and is adjacent to sigmoid colonic inflammation. There is air within the urinary bladder. Perivesicular stranding about the bladder dome. Other: Pelvic soft tissue stranding primarily about the sigmoid colon, no discrete abscess or free fluid. No free air. Musculoskeletal: There are no acute or suspicious osseous abnormalities. IMPRESSION: 1. Left lower quadrant inflammation with sigmoid colonic wall thickening and fat stranding in the region of multiple diverticula. There is loss of normal fat plane between the sigmoid colon and urinary bladder with associated bladder  wall thickening as well as air within the bladder lumen. Colovesicular fistula is considered, either secondary to diverticulitis or colonic malignancy. Alternatively, bladder wall thickening could be secondary to infection or bladder neoplasm and air introduced iatrogenically. Colonoscopy and possibly cystoscopy recommended. 2. Mild bilateral hydroureteronephrosis without urolithiasis. 3. Cortical scarring in the right kidney, unchanged. Electronically Signed   By: Melanie  Ehinger M.D.   On: 09/22/2015 20:58   I have personally reviewed and evaluated these images and lab results as part of my medical decision-making.   EKG Interpretation None      MDM   Final diagnoses:  Sepsis, due to unspecified organism (HCC)    Patient is a 49-year-old male with hemophilia a presenting with fever, hematuria and suprapubic pain. Highly suspect urinary tract infection/pyelonephritis in this gentleman. Patient meets requirements for septic protocol. We will give 2 L of fluids. Lactic is elevated at 2.64. Awaiting urine. We'll presumtively treat with ceftriaxone.  We'll get hemoglobin area patient may require inpatient admission given the level of vital signs abnormalities on arrival..  Will get CT given febrile, UTI, to make sure there is no infected stone.   Will admit, still mildly tachy after 2 L.  Hemodynamically stable.   CRITICAL CARE Performed by: Courteney L MacKuen Total critical care time: 30 minutes Critical care time was exclusive of separately billable procedures and treating other patients. Critical care was necessary to treat or prevent imminent or life-threatening deterioration. Critical care was time spent personally by me on the following activities: development of treatment plan with patient and/or surrogate as well as nursing, discussions with consultants, evaluation of patient's response to treatment, examination of patient, obtaining history from patient or surrogate, ordering and  performing treatments and interventions, ordering and review of laboratory studies, ordering and review of radiographic studies, pulse oximetry and re-evaluation of patient's condition.    Courteney Lyn Mackuen, MD 09/22/15 2217 

## 2015-09-22 NOTE — H&P (Signed)
History and Physical  Patient Name: Darrell Graham     JIR:[id number]    DOB: 1965/09/03    DOA: 09/22/2015 Referring physician: Gerald Leitz, MD PCP: London Pepper, MD      Chief Complaint: Hematuria, fever  HPI: Darrell Graham is a 50 y.o. male with a past medical history significant for hemophilia A, hep C undetec viral load, and celiac who presents with hematuria and fever.  He has been having some dysuria over several weeks actually.  Now, in the last 2-3 days, this progressed, Friday he had gross hematuria, maybe some pneumaturia, and yesterday and today he had fever to 102F at home, malaise, called his girlfriend who thought he sounded "really sick" and came to the ER.  In the ED, he was febrile 101F, tachycardic to 130s, and with normal BP.  Cr 1.0, BUN 12, WBC 19K, Hgb 12, lactate 2.2 and urinalysis with pyuria and hematuria.  Sepsis protocol was started, blood and urine cultures were obtained, ceftriaxone was administered and [facility] were called to evaluate for admission.  The patient had the flu two weeks ago, swab confirmed.  He has had no respiratory symptoms this week, cough, sputum or hemoptysis.  He has had some new lower abdomen and low back pain today.  No vomiting or diarrhea.  He has no history of renal stones.       Review of Systems:  All other systems negative except as just noted or noted in the history of present illness.  Allergies  Allergen Reactions  . Hemophilia Support [Acid Blockers Support] Other (See Comments)    Hemophilia Factor VIII  . Codeine Itching and Rash  . Gluten Meal Diarrhea, Itching and Rash    General lethargy  . Tetracyclines & Related Itching and Rash  . Wheat Bran Diarrhea, Itching and Rash    General lethargy    Prior to Admission medications   Medication Sig Start Date End Date Taking? Authorizing Provider  acetaminophen (TYLENOL) 325 MG tablet Take 650 mg by mouth daily as needed for mild pain, fever or headache.   Yes  Historical Provider, MD  ALPRAZolam Duanne Moron) 0.25 MG tablet Take 0.25 mg by mouth at bedtime as needed for anxiety.   Yes Historical Provider, MD  amLODipine (NORVASC) 5 MG tablet Take 5 mg by mouth every evening.    Yes Historical Provider, MD  Antihemophilic Factor rAHF-PAF (XYNTHA SOLOFUSE) 3000 UNITS KIT Inject 3,000 Units into the vein as needed. Patient taking differently: Inject 3,000 Units into the vein as needed (for bleeding).  07/22/15  Yes Ladell Pier, MD  cholecalciferol (VITAMIN D) 1000 UNITS tablet Take 1,000 Units by mouth every evening.    Yes Historical Provider, MD  Dapsone (ACZONE EX) Apply 1 application topically daily as needed (for acne).    Yes Historical Provider, MD  Multiple Vitamins-Minerals (MULTIVITAMIN PO) Take 1 tablet by mouth daily.   Yes Historical Provider, MD  Naphazoline-Pheniramine (OPCON-A) 0.027-0.315 % SOLN Apply 1 drop to eye daily as needed (for dry eyes).   Yes Historical Provider, MD  valsartan (DIOVAN) 160 MG tablet 160 mg daily. 12/22/13  Yes Historical Provider, MD  venlafaxine XR (EFFEXOR XR) 37.5 MG 24 hr capsule Take 1 capsule (37.5 mg total) by mouth daily with breakfast. 07/02/15  Yes Kathrynn Ducking, MD  tiZANidine (ZANAFLEX) 2 MG tablet Take 1 tablet (2 mg total) by mouth 2 (two) times daily. Patient not taking: Reported on 09/22/2015 04/10/15   Kathrynn Ducking, MD  Past Medical History  Diagnosis Date  . Hemophilia A ([place])   . Hepatitis C virus     recent viral titer negative  . Glaucoma   . Deviated nasal septum   . Allergy   . Asthma   . Scoliosis   . Celiac disease   . Chronic arthropathy     left ankle & right elbow  . H/O vitamin D deficiency   . Telangiectasias     upper back and chest  . High blood pressure   . Diverticulitis   . Hernia   . OCD (obsessive compulsive disorder) 07/01/2015    Past Surgical History  Procedure Laterality Date  . Ankle arthroscopy  1996    Family history: family history includes  Dementia in his mother; Heart disease in his father; Migraines in his father; Stroke in his father.  Social History: Patient lives alone.  He is functional, he is currently unemployed because of headaches, hep C, and celiac, but is independent with all ADLs and IADLs.  Non-smoker.       Physical Exam: BP 133/79 mmHg  Pulse 118  Temp(Src) 98.7 F (37.1 C) (Oral)  Resp 23  Ht _0  (1.778 m)  Wt 70.903 kg (156 lb 5 oz)  BMI 22.43 kg/m2  SpO2 99% General appearance: Well-developed, but thin adult male, alert and in no acute distress.   Eyes: Anicteric, conjunctiva pink, lids and lashes normal.     ENT: No nasal deformity, discharge, or epistaxis.  OP moist without lesions.  Minimal posterior erythema. Lymph: No cervical or supraclavicular lymphadenopathy. Skin: Warm and dry.  No jaundice.  No suspicious rashes or lesions. Cardiac: Tachcyardic, regular, nl S1-S2, no murmurs appreciated.  Capillary refill is brisk.  JVP normal.  No LE edema.  Radial pulses 2+ and symmetric. Respiratory: Normal respiratory rate and rhythm.  CTAB without rales or wheezes. Abdomen: Abdomen soft without rigidity.  Suprapubic mild TTP. No ascites, distension.   MSK: No deformities or effusions. Neuro: cranial nerves normal.  Sensorium intact and responding to questions, attention normal.  Speech is fluent.  Moves all extremities equally and with normal coordination.    Psych: Behavior appropriate.  Affect pleasant.  No evidence of aural or visual hallucinations or delusions.       Labs on Admission:  The metabolic panel shows normal K, bicarb, and renal function. No acidosis. Lactate minimally elevated. The complete blood count shows leukocytosis, mild anemia, normocytic, new from 2013.   Radiological Exams on Admission: Ct Renal Stone Study 09/22/2015   IMPRESSION: 1. Left lower quadrant inflammation with sigmoid colonic wall thickening and fat stranding in the region of multiple diverticula. There is  loss of normal fat plane between the sigmoid colon and urinary bladder with associated bladder wall thickening as well as air within the bladder lumen. Colovesicular fistula is considered, either secondary to diverticulitis or colonic malignancy. Alternatively, bladder wall thickening could be secondary to infection or bladder neoplasm and air introduced iatrogenically. Colonoscopy and possibly cystoscopy recommended. 2. Mild bilateral hydroureteronephrosis without urolithiasis. 3. Cortical scarring in the right kidney, unchanged.     EKG: Independently reviewed. Rate 112, sinus rhythm.  QTc 450, no ST changes.    Assessment/Plan 1. Sepsis:  This is new.  Suspected source diverticulitis vs urinary tree from CV fistula #2. Organism unknown. Patient meets criteria given tachycardia, fever, leukocytosis, and evidence of organ dysfunction (elevated lactic acid).  Blood and urine cultures drawn.  Lactate exceeds 2 mmol/L and repeat ordered within  6 hours.  MAP > 65 mmHg. -Will switch to Cipro/Flagyl for coverage of community acquired UTI or diverticulitis -30 ml/kg bolus given in ED, will repeat lactic acid and trend to resolution -Acetaminophen for fever -Follow blood and urine cultures -Vitals q4hrs overnight   2. Possible colovesicular fistula: The CT study shows "Colovesicular fistula is considered, either secondary to diverticulitis or colonic malignancy."  This would explain pneumaturia.  Per Gen Surg, anticipate antibiotics, eval tomorrow, and then possible bowel prep and surgical intervention in 1 or 2 phases, potentially while inpatient -Consult to General Surgery, appreciate cares -Consult to Urology, appreciate cares  3. Anemia, acute blood loss:  This is new.   -Repeat CBC and start iron if worsening  4. OCD/anxiety:  -Continue home venlafaxine and alprazolam PRN  5. HTN:  -Hold amlodipine and valsartan overnight, restart tomorrow if remains hemodynamically stable or if  hypertensive overnight  6. History of Hep C: Last quant negative in 2014. -Check Hep C viral load      DVT PPx: SCDs  Diet: Gluten free Consultants: None Code Status: FULL Family Communication: Partner at bedside  Medical decision making: What exists of the patient's previous chart was reviewed in depth and the case was discussed with Dr. Thomasene Lot and Drs. Randal Buba from Urology and General Surgery. Patient seen 9:13 PM on 09/22/2015.  Disposition Plan:  I recommend admission to med surg bed.  Clinical condition: at the time of my evluation, the patient is hemodynamically stable, no evidence of altered mental status or worsening organ failure and is stable for a med surg bed, inpatient status.  Anticipate IV antibiotics for 1-2 days, when culture sensitivities return, transition to oral agents and discharge.  Other dispo per CT renal stone study.      Edwin Dada [facility] Pager [phone]

## 2015-09-22 NOTE — ED Notes (Signed)
He c/o 1 month history of dysuria, increased in severity Friday night. He began to have hematuria, urgency and noticed bubbles in his urine since Friday. hes also had some fevers and lower abd pain since yesterday. He denies n/v. He is A&Ox4, resp e/u. He has hemophilia which has him very concerned for the blood in his urine

## 2015-09-22 NOTE — Progress Notes (Signed)
Urology pre-consult note  We'll see patient tomorrow for full/formal consultation. I've discussed this case with the admitting physician and reviewed the clinical history and imaging studies. Patient has a clinical picture as well as imaging consistent with colovesical fistula. Imaging is limited by the lack of intravenous and oral contrast. Gen. surgery typically is the service that manages colovesical fistula. Urology can be involved for the bladder reconstruction if requested as well as pre-placements of ureteral stents on occasion. Sometimes cystoscopy can be useful to confirm the diagnosis as well as to locate the position of the fistula on the bladder. Colonoscopy is generally at the discretion of general surgery. This is likely to be secondary to an inflammatory picture (diverticulitis). but of course GI malignancy cannot be ruled out at this time. The patient did have some mild hydronephrosis but a normal creatinine. Bladder appeared somewhat distended on CT imaging of the patient would probably benefit from initiation of out for blocker therapy with tamsulosin daily.

## 2015-09-22 NOTE — Progress Notes (Signed)
ANTIBIOTIC CONSULT NOTE - INITIAL  Pharmacy Consult for Ceftriaxone Indication: UTI  Allergies  Allergen Reactions  . Codeine   . Tetracyclines & Related     Patient Measurements: Height:  (177.8 cm) Weight: 156 lb 5 oz (70.903 kg) IBW/kg (Calculated) : 73   Vital Signs: Temp: 98.7 F (37.1 C) (02/19 1818) Temp Source: Oral (02/19 1818) BP: 135/78 mmHg (02/19 1617) Pulse Rate: 134 (02/19 1617) Intake/Output from previous day:   Intake/Output from this shift:    Labs:  Recent Labs  09/22/15 1639  WBC 19.0*  HGB 12.2*  PLT 447*  CREATININE 0.99   Estimated Creatinine Clearance: 92.5 mL/min (by C-G formula based on Cr of 0.99). No results for input(s): VANCOTROUGH, VANCOPEAK, VANCORANDOM, GENTTROUGH, GENTPEAK, GENTRANDOM, TOBRATROUGH, TOBRAPEAK, TOBRARND, AMIKACINPEAK, AMIKACINTROU, AMIKACIN in the last 72 hours.   Microbiology: No results found for this or any previous visit (from the past 720 hour(s)).  Medical History: Past Medical History  Diagnosis Date  . Hemophilia A (HCC)   . Hepatitis C virus     recent viral titer negative  . Glaucoma   . Deviated nasal septum   . Allergy   . Asthma   . Scoliosis   . Celiac disease   . Chronic arthropathy     left ankle & right elbow  . H/O vitamin D deficiency   . Telangiectasias     upper back and chest  . High blood pressure   . Diverticulitis   . Hernia   . OCD (obsessive compulsive disorder) 07/01/2015    Assessment: 50 y.o male with PMH of hemophilia presented to ED on 09/22/15 with c/o 1 month history of dysuria, increased in severity Friday night. He began to have hematuria, urgency and noticed bubbles in his urine since.  Pharmacy consulted to dose Ceftriaxone for UTI.  Ceftriaxone does not require dose adjustment for renal impairment.  SCr is wnl = 0.99, CrCl ~ 92 mlmin   Plan:  Ceftriaxone 2gm IV dose already ordered in ED- okay to give this dose then 1gm IV q24h  Noah Delaine,  RPh Clinical Pharmacist Pager: [phone] 09/22/2015,6:28 PM

## 2015-09-23 LAB — CBC
HCT: 31.3 % — ABNORMAL LOW (ref 39.0–52.0)
Hemoglobin: 10.1 g/dL — ABNORMAL LOW (ref 13.0–17.0)
MCH: 29.2 pg (ref 26.0–34.0)
MCHC: 32.3 g/dL (ref 30.0–36.0)
MCV: 90.5 fL (ref 78.0–100.0)
Platelets: 356 10*3/uL (ref 150–400)
RBC: 3.46 MIL/uL — ABNORMAL LOW (ref 4.22–5.81)
RDW: 12.9 % (ref 11.5–15.5)
WBC: 16.5 10*3/uL — ABNORMAL HIGH (ref 4.0–10.5)

## 2015-09-23 LAB — COMPREHENSIVE METABOLIC PANEL
ALT: 11 U/L — ABNORMAL LOW (ref 17–63)
AST: 16 U/L (ref 15–41)
Albumin: 3.1 g/dL — ABNORMAL LOW (ref 3.5–5.0)
Alkaline Phosphatase: 53 U/L (ref 38–126)
Anion gap: 8 (ref 5–15)
BUN: 7 mg/dL (ref 6–20)
CO2: 25 mmol/L (ref 22–32)
Calcium: 8.6 mg/dL — ABNORMAL LOW (ref 8.9–10.3)
Chloride: 108 mmol/L (ref 101–111)
Creatinine, Ser: 0.79 mg/dL (ref 0.61–1.24)
GFR calc Af Amer: >60 mL/min (ref >60)
GFR calc non Af Amer: >60 mL/min (ref >60)
Glucose, Bld: 121 mg/dL — ABNORMAL HIGH (ref 65–99)
Potassium: 3.6 mmol/L (ref 3.5–5.1)
Sodium: 141 mmol/L (ref 135–145)
Total Bilirubin: 0.6 mg/dL (ref 0.3–1.2)
Total Protein: 6.7 g/dL (ref 6.5–8.1)

## 2015-09-23 LAB — LACTIC ACID, PLASMA
Lactic Acid, Venous: 0.7 mmol/L (ref 0.5–2.0)
Lactic Acid, Venous: 1.1 mmol/L (ref 0.5–2.0)

## 2015-09-23 LAB — GLUCOSE, CAPILLARY: Glucose-Capillary: 111 mg/dL — ABNORMAL HIGH (ref 65–99)

## 2015-09-23 MED ORDER — DEXTROSE-NACL 5-0.9 % IV SOLN
INTRAVENOUS | Status: DC
  Administered 2015-09-23 – 2015-09-26 (×5): via INTRAVENOUS
  Filled 2015-09-23 (×5): qty 1000

## 2015-09-23 NOTE — Progress Notes (Signed)
  Subjective: Less pain, hungry  Objective: Vital signs in last 24 hours: Temp:  [98.5 F (36.9 C)-101.5 F (38.6 C)] 98.5 F (36.9 C) (02/20 0611) Pulse Rate:  [94-134] 94 (02/20 0611) Resp:  [13-23] 18 (02/20 0611) BP: (111-135)/(64-85) 111/64 mmHg (02/20 0611) SpO2:  [95 %-99 %] 99 % (02/20 0611) Weight:  [70.7 kg (155 lb 13.8 oz)-74.844 kg (165 lb)] 70.7 kg (155 lb 13.8 oz) (02/20 0611)    Intake/Output from previous day: 02/19 0701 - 02/20 0700 In: 2050 [IV Piggyback:2050] Out: 60 [Urine:60] Intake/Output this shift:    General appearance: alert and cooperative Resp: clear to auscultation bilaterally GI: soft, mild tenderness suprapubic, no guarding  Lab Results:   Recent Labs  09/22/15 1639 09/23/15 0152  WBC 19.0* 16.5*  HGB 12.2* 10.1*  HCT 37.5* 31.3*  PLT 447* 356   BMET  Recent Labs  09/22/15 1639 09/23/15 0152  NA 135 141  K 4.0 3.6  CL 100* 108  CO2 22 25  GLUCOSE 155* 121*  BUN 12 7  CREATININE 0.99 0.79  CALCIUM 9.5 8.6*    Anti-infectives: Anti-infectives    Start     Dose/Rate Route Frequency Ordered Stop   09/23/15 1800  cefTRIAXone (ROCEPHIN) 1 g in dextrose 5 % 50 mL IVPB  Status:  Discontinued     1 g 100 mL/hr over 30 Minutes Intravenous Every 24 hours 09/22/15 1828 09/22/15 2153   09/22/15 2200  ciprofloxacin (CIPRO) IVPB 400 mg     400 mg 200 mL/hr over 60 Minutes Intravenous Every 12 hours 09/22/15 2153     09/22/15 2200  metroNIDAZOLE (FLAGYL) IVPB 500 mg     500 mg 100 mL/hr over 60 Minutes Intravenous Every 8 hours 09/22/15 2153     09/22/15 1830  cefTRIAXone (ROCEPHIN) 2 g in dextrose 5 % 50 mL IVPB     2 g 100 mL/hr over 30 Minutes Intravenous  Once 09/22/15 1822 09/22/15 1945      Assessment/Plan: Diverticulitis with colovesicle fistula - needs bowel rest and IV ABX. Change to sips clears. I feel he will do best with IV ABX and bowel rest now to cool things off then plan elective colectomy in several weeks. This  will yield a much lower chance of colostomy/need for second surgery. I will discuss with our partner, Dr. Maisie Fus, who is a colorectal specialist. We will follow.  Hemophilia - noted need for factor 8 before any surgical procedures  LOS: 1 day    Caytlin Better E 09/23/2015

## 2015-09-23 NOTE — Progress Notes (Signed)
[facility] PROGRESS NOTE  Darrell Graham VHQ:[id number] DOB: Nov 21, 1965 DOA: 09/22/2015 PCP: Farris Has, MD  Assessment/Plan:   Sepsis  Present on admission fever of 103, WBC of 16.5 and diverticulitis. Protocol initiated in the ED due to fever, tachycardia, leukocytosis, elevated lactate. Infection as above, treatment with IVF and antibiotics. Blood and urine cultures pending.  Colovesicular fistula - CT scan showed wall thickening of urinary bladder with air present as well as wall thickening of the sigmoid colon with multiple colonic diverticula. Loss of fat plane between sigmoid colon and urinary bladder suspicious for colovesicular fistula. Colonoscopy and cystoscopy recommended per radiology, waiting for urology to formally evaluate though recommends initiating tamsulosin for bladder distention on CT. Initiate bowel rest with clear liquid diet. Surgery in several weeks. Pt receiving IVF, Ciprofloxacin, and Metronidazole. Will continue to monitor.  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 Also complaining of suprapubic and LLQ abdominal tenderness. Due to fever, tachycardia, elevated WBC, and lactate, sepsis protocol initiated with 2L IVF and ceftriaxone. Workup revealed UTI, suspected  colovesicular fistula on CT scan though cannot rule our colonic malignancy. Recommending colonoscopy and cystoscopy to confirm. Waiting formal urology consult at this time though recommends initiating tamsulosin due to bladder distention on CT. Started on ceftriaxone, ciprofloxacin, and metronidazole IV, clear liquid diet. Plan for bowel rest and colectomy in several weeks.  Objective: Filed Vitals:   09/23/15 0611 09/23/15 1004  BP: 111/64 93/56  Pulse: 94 103  Temp: 98.5 F (36.9 C) 99 F (37.2 C)  Resp: 18 16    Intake/Output Summary (Last 24 hours) at 09/23/15 1128 Last data filed at 09/23/15 1001  Gross per 24 hour  Intake   2050 ml  Output    235 ml  Net   1815 ml   Filed Weights   09/22/15 1617 09/22/15 1824 09/23/15 0611  Weight: 74.844 kg (165 lb) 70.903 kg (156 lb 5 oz) 70.7 kg (155 lb 13.8 oz)    Exam:   General:  Alert and oriented, no acute distress  Cardiovascular: Tachycardia rate between 100-105 bpm, no m/r/g  Respiratory: CTAB  Abdomen: +BS, suprapubic and LLQ tenderness to palpation  Musculoskeletal: No obvious deformities  Data Reviewed: Basic Metabolic Panel:  Recent Labs Lab 09/22/15 1639 09/23/15 0152  NA 135 141  K 4.0 3.6  CL 100* 108  CO2 22 25  GLUCOSE 155* 121*  BUN 12 7  CREATININE 0.99 0.79  CALCIUM 9.5 8.6*   Liver Function Tests:  Recent Labs Lab 09/23/15 0152  AST 16  ALT 11*  ALKPHOS 53  BILITOT 0.6  PROT 6.7  ALBUMIN 3.1*   No results for input(s): LIPASE, AMYLASE in the last 168 hours. No results for input(s): AMMONIA in the last 168 hours. CBC:  Recent Labs Lab 09/22/15 1639 09/23/15 0152  WBC 19.0* 16.5*  NEUTROABS 16.0*  --   HGB 12.2* 10.1*  HCT 37.5* 31.3*  MCV 90.8 90.5  PLT 447* 356   Cardiac Enzymes: No results for input(s): CKTOTAL, CKMB, CKMBINDEX, TROPONINI in the last 168 hours. BNP (last 3 results) No results for input(s): BNP in the last 8760 hours.  ProBNP (last 3 results) No  results for input(s): PROBNP in the last 8760 hours.  CBG: No results for input(s): GLUCAP in the last 168 hours.  No results found for this or any previous visit (from the past 240 hour(s)).   Studies: Ct Renal Stone Study  09/22/2015  CLINICAL DATA:  Hematuria. Urinary tract infection. Evaluate for urolithiasis. Urinary pain for 1 month. EXAM: CT ABDOMEN AND PELVIS WITHOUT CONTRAST TECHNIQUE: Multidetector CT imaging of the abdomen and pelvis was performed following the standard protocol without IV contrast. COMPARISON:  CT 12/01/2005 FINDINGS: Lower chest: The included lung bases are clear. No pleural effusion. Liver: No focal lesion allowing for lack contrast. Hepatobiliary: Gallbladder physiologically distended, no calcified stone. No biliary dilatation. Pancreas: No ductal dilatation or inflammation. Spleen: Normal. Adrenal glands: No nodule. Kidneys: Mild bilateral hydronephrosis. Both ureters are dilated to the bladder insertion, right greater than left. No urolithiasis. Focal cortical scarring in the mid upper right kidney. Stomach/Bowel: Stomach physiologically distended. There are no dilated or thickened small bowel loops. Wall thickening and stranding about the sigmoid colon with multiple colonic diverticula. This is in the region of prior acute diverticulitis on remote prior exam. Fat plane between the sigmoid colon and left urinary bladder dome is obscured, there is associated bladder wall thickening. No free air or extraluminal abscess. The appendix is normal. Vascular/Lymphatic: No retroperitoneal adenopathy. Abdominal aorta is normal in caliber. Reproductive: Prostate gland appears prominent in age measuring 4.9 cm transverse dimension. Seminal vesicles are prominent. Bladder: Mildly distended with wall thickening involving the posterior left aspect. Wall thickening measures up to 1.5 cm and is adjacent to sigmoid colonic inflammation. There is air within the urinary bladder. Perivesicular  stranding about the bladder dome. Other: Pelvic soft tissue stranding primarily about the sigmoid colon, no discrete abscess or free fluid. No free air. Musculoskeletal: There are no acute or suspicious osseous abnormalities. IMPRESSION: 1. Left lower quadrant inflammation with sigmoid colonic wall thickening and fat stranding in the region of multiple diverticula. There is loss of normal fat plane between the sigmoid colon and urinary bladder with associated bladder wall thickening as well as air within the bladder lumen. Colovesicular fistula is considered, either secondary to diverticulitis or colonic malignancy. Alternatively, bladder wall thickening could be secondary to infection or bladder neoplasm and air introduced iatrogenically. Colonoscopy and possibly cystoscopy recommended. 2. Mild bilateral hydroureteronephrosis without urolithiasis. 3. Cortical scarring in the right kidney, unchanged. Electronically Signed   By: Rubye Oaks M.D.   On: 09/22/2015 20:58    Scheduled Meds: . ciprofloxacin  400 mg Intravenous Q12H  . metronidazole  500 mg Intravenous Q8H  . venlafaxine XR  37.5 mg Oral Q breakfast   Continuous Infusions:   Principal Problem:   Sepsis (HCC) Active Problems:   Hemophilia (HCC)   Headache   OCD (obsessive compulsive disorder)   UTI (lower urinary tract infection)     Maylene Roes, PA-S wrote parts of this note. [facility] Pager [phone]. If 7PM-7AM, please contact night-coverage at [email or web address], password [facility] 09/23/2015, 11:28 AM  LOS: 1 day    Clint Lipps Pager: [phone] 09/23/2015, 3:26 PM

## 2015-09-23 NOTE — Consult Note (Signed)
Reason for Consult:Colovesical fistula Referring Physician: [name] is an 50 y.o. male.  HPI: Problems since the end of last week.  Initially has had severe pelvic and lower abdominal pain with urination, started having hematuria on Friday, then started having bubbles from the urine Saturday through Sunday.  Admitted for abdominal pain and dysuria with potential sepsis from UTI  Past Medical History  Diagnosis Date  . Hemophilia A ([place])   . Hepatitis C virus     recent viral titer negative  . Glaucoma   . Deviated nasal septum   . Allergy   . Asthma   . Scoliosis   . Celiac disease   . Chronic arthropathy     left ankle & right elbow  . H/O vitamin D deficiency   . Telangiectasias     upper back and chest  . High blood pressure   . Diverticulitis   . Hernia   . OCD (obsessive compulsive disorder) 07/01/2015    Past Surgical History  Procedure Laterality Date  . Ankle arthroscopy  1996    Family History  Problem Relation Age of Onset  . Hemophilia    . Hepatitis C    . Osteopenia    . Psoriasis    . GER disease    . Dementia Mother   . Migraines Father   . Stroke Father   . Heart disease Father     Social History:  reports that he has never smoked. He has never used smokeless tobacco. He reports that he does not drink alcohol or use illicit drugs.  Allergies:  Allergies  Allergen Reactions  . Hemophilia Support [Acid Blockers Support] Other (See Comments)    Hemophilia Factor VIII  . Codeine Itching and Rash  . Gluten Meal Diarrhea, Itching and Rash    General lethargy  . Tetracyclines & Related Itching and Rash  . Wheat Bran Diarrhea, Itching and Rash    General lethargy    Medications: I have reviewed the patient's current medications.  Results for orders placed or performed during the hospital encounter of 09/22/15 (from the past 48 hour(s))  Basic metabolic panel     Status: Abnormal   Collection Time: 09/22/15  4:39 PM  Result  Value Ref Range   Sodium 135 135 - 145 mmol/L   Potassium 4.0 3.5 - 5.1 mmol/L   Chloride 100 (L) 101 - 111 mmol/L   CO2 22 22 - 32 mmol/L   Glucose, Bld 155 (H) 65 - 99 mg/dL   BUN 12 6 - 20 mg/dL   Creatinine, Ser 0.99 0.61 - 1.24 mg/dL   Calcium 9.5 8.9 - 10.3 mg/dL   GFR calc non Af Amer >60 >60 mL/min   GFR calc Af Amer >60 >60 mL/min    Comment: (NOTE) The eGFR has been calculated using the CKD EPI equation. This calculation has not been validated in all clinical situations. eGFR's persistently <60 mL/min signify possible Chronic Kidney Disease.    Anion gap 13 5 - 15  CBC with Differential     Status: Abnormal   Collection Time: 09/22/15  4:39 PM  Result Value Ref Range   WBC 19.0 (H) 4.0 - 10.5 K/uL   RBC 4.13 (L) 4.22 - 5.81 MIL/uL   Hemoglobin 12.2 (L) 13.0 - 17.0 g/dL   HCT 37.5 (L) 39.0 - 52.0 %   MCV 90.8 78.0 - 100.0 fL   MCH 29.5 26.0 - 34.0 pg   MCHC 32.5 30.0 -  36.0 g/dL   RDW 12.7 11.5 - 15.5 %   Platelets 447 (H) 150 - 400 K/uL   Neutrophils Relative % 84 %   Lymphocytes Relative 8 %   Monocytes Relative 8 %   Eosinophils Relative 0 %   Basophils Relative 0 %   Neutro Abs 16.0 (H) 1.7 - 7.7 K/uL   Lymphs Abs 1.5 0.7 - 4.0 K/uL   Monocytes Absolute 1.5 (H) 0.1 - 1.0 K/uL   Eosinophils Absolute 0.0 0.0 - 0.7 K/uL   Basophils Absolute 0.0 0.0 - 0.1 K/uL   Smear Review LARGE PLATELETS PRESENT   I-Stat CG4 Lactic Acid, ED (Not at [facility])     Status: Abnormal   Collection Time: 09/22/15  4:55 PM  Result Value Ref Range   Lactic Acid, Venous 2.78 (HH) 0.5 - 2.0 mmol/L   Comment NOTIFIED PHYSICIAN   Urinalysis, Routine w reflex microscopic-may I&O cath if menses (not at [facility])     Status: Abnormal   Collection Time: 09/22/15  7:15 PM  Result Value Ref Range   Color, Urine AMBER (A) YELLOW    Comment: BIOCHEMICALS MAY BE AFFECTED BY COLOR   APPearance TURBID (A) CLEAR   Specific Gravity, Urine 1.023 1.005 - 1.030   pH 5.5 5.0 - 8.0   Glucose, UA NEGATIVE  NEGATIVE mg/dL   Hgb urine dipstick LARGE (A) NEGATIVE   Bilirubin Urine NEGATIVE NEGATIVE   Ketones, ur 15 (A) NEGATIVE mg/dL   Protein, ur 100 (A) NEGATIVE mg/dL   Nitrite NEGATIVE NEGATIVE   Leukocytes, UA MODERATE (A) NEGATIVE  Urine microscopic-add on     Status: None   Collection Time: 09/22/15  7:15 PM  Result Value Ref Range   Squamous Epithelial / LPF NONE SEEN NONE SEEN   WBC, UA TOO NUMEROUS TO COUNT 0 - 5 WBC/hpf   RBC / HPF 6-30 0 - 5 RBC/hpf   Bacteria, UA NONE SEEN NONE SEEN  Lactic acid, plasma     Status: None   Collection Time: 09/22/15 11:05 PM  Result Value Ref Range   Lactic Acid, Venous 0.7 0.5 - 2.0 mmol/L  Lactic acid, plasma     Status: None   Collection Time: 09/23/15  1:52 AM  Result Value Ref Range   Lactic Acid, Venous 1.1 0.5 - 2.0 mmol/L  Comprehensive metabolic panel     Status: Abnormal   Collection Time: 09/23/15  1:52 AM  Result Value Ref Range   Sodium 141 135 - 145 mmol/L   Potassium 3.6 3.5 - 5.1 mmol/L   Chloride 108 101 - 111 mmol/L   CO2 25 22 - 32 mmol/L   Glucose, Bld 121 (H) 65 - 99 mg/dL   BUN 7 6 - 20 mg/dL   Creatinine, Ser 0.79 0.61 - 1.24 mg/dL   Calcium 8.6 (L) 8.9 - 10.3 mg/dL   Total Protein 6.7 6.5 - 8.1 g/dL   Albumin 3.1 (L) 3.5 - 5.0 g/dL   AST 16 15 - 41 U/L   ALT 11 (L) 17 - 63 U/L   Alkaline Phosphatase 53 38 - 126 U/L   Total Bilirubin 0.6 0.3 - 1.2 mg/dL   GFR calc non Af Amer >60 >60 mL/min   GFR calc Af Amer >60 >60 mL/min    Comment: (NOTE) The eGFR has been calculated using the CKD EPI equation. This calculation has not been validated in all clinical situations. eGFR's persistently <60 mL/min signify possible Chronic Kidney Disease.    Anion  gap 8 5 - 15  CBC     Status: Abnormal   Collection Time: 09/23/15  1:52 AM  Result Value Ref Range   WBC 16.5 (H) 4.0 - 10.5 K/uL   RBC 3.46 (L) 4.22 - 5.81 MIL/uL   Hemoglobin 10.1 (L) 13.0 - 17.0 g/dL   HCT 31.3 (L) 39.0 - 52.0 %   MCV 90.5 78.0 - 100.0 fL     MCH 29.2 26.0 - 34.0 pg   MCHC 32.3 30.0 - 36.0 g/dL   RDW 12.9 11.5 - 15.5 %   Platelets 356 150 - 400 K/uL    Ct Renal Stone Study  09/22/2015  CLINICAL DATA:  Hematuria. Urinary tract infection. Evaluate for urolithiasis. Urinary pain for 1 month. EXAM: CT ABDOMEN AND PELVIS WITHOUT CONTRAST TECHNIQUE: Multidetector CT imaging of the abdomen and pelvis was performed following the standard protocol without IV contrast. COMPARISON:  CT 12/01/2005 FINDINGS: Lower chest: The included lung bases are clear. No pleural effusion. Liver: No focal lesion allowing for lack contrast. Hepatobiliary: Gallbladder physiologically distended, no calcified stone. No biliary dilatation. Pancreas: No ductal dilatation or inflammation. Spleen: Normal. Adrenal glands: No nodule. Kidneys: Mild bilateral hydronephrosis. Both ureters are dilated to the bladder insertion, right greater than left. No urolithiasis. Focal cortical scarring in the mid upper right kidney. Stomach/Bowel: Stomach physiologically distended. There are no dilated or thickened small bowel loops. Wall thickening and stranding about the sigmoid colon with multiple colonic diverticula. This is in the region of prior acute diverticulitis on remote prior exam. Fat plane between the sigmoid colon and left urinary bladder dome is obscured, there is associated bladder wall thickening. No free air or extraluminal abscess. The appendix is normal. Vascular/Lymphatic: No retroperitoneal adenopathy. Abdominal aorta is normal in caliber. Reproductive: Prostate gland appears prominent in age measuring 4.9 cm transverse dimension. Seminal vesicles are prominent. Bladder: Mildly distended with wall thickening involving the posterior left aspect. Wall thickening measures up to 1.5 cm and is adjacent to sigmoid colonic inflammation. There is air within the urinary bladder. Perivesicular stranding about the bladder dome. Other: Pelvic soft tissue stranding primarily about the  sigmoid colon, no discrete abscess or free fluid. No free air. Musculoskeletal: There are no acute or suspicious osseous abnormalities. IMPRESSION: 1. Left lower quadrant inflammation with sigmoid colonic wall thickening and fat stranding in the region of multiple diverticula. There is loss of normal fat plane between the sigmoid colon and urinary bladder with associated bladder wall thickening as well as air within the bladder lumen. Colovesicular fistula is considered, either secondary to diverticulitis or colonic malignancy. Alternatively, bladder wall thickening could be secondary to infection or bladder neoplasm and air introduced iatrogenically. Colonoscopy and possibly cystoscopy recommended. 2. Mild bilateral hydroureteronephrosis without urolithiasis. 3. Cortical scarring in the right kidney, unchanged. Electronically Signed   By: Jeb Levering M.D.   On: 09/22/2015 20:58    Review of Systems  Constitutional: Positive for fever and chills.  HENT: Negative.   Eyes: Negative.   Gastrointestinal: Positive for abdominal pain.  Genitourinary: Positive for dysuria, urgency, frequency and hematuria.       Air in urine  Skin: Negative.   Neurological: Negative.   Endo/Heme/Allergies: Bruises/bleeds easily.       Hemophilia A   Blood pressure 111/64, pulse 94, temperature 98.5 F (36.9 C), temperature source Oral, resp. rate 18, height '5\' 10"'  (1.778 m), weight 70.7 kg (155 lb 13.8 oz), SpO2 99 %. Physical Exam  Constitutional: He is oriented to  person, place, and time. He appears well-developed and well-nourished.  HENT:  Head: Normocephalic and atraumatic.  Eyes: Conjunctivae and EOM are normal. Pupils are equal, round, and reactive to light.  Neck: Normal range of motion. Neck supple.  Cardiovascular: Normal rate, regular rhythm and normal heart sounds.  Exam reveals no friction rub.   No murmur heard. Respiratory: Effort normal and breath sounds normal.  GI: Soft. Normal appearance  and bowel sounds are normal. There is tenderness in the suprapubic area. There is rebound and guarding.    Genitourinary: Penis normal.  Musculoskeletal: Normal range of motion.  Neurological: He is alert and oriented to person, place, and time. He has normal reflexes.  Skin: Skin is warm and dry.  Psychiatric: He has a normal mood and affect. His behavior is normal. Judgment and thought content normal.    Assessment/Plan: Colovesical fistula associated with radiological evidence of severe acute or subacute diverticulitis. Urology has been contacted IV antibiotic, possibly bowel prep and surgery, possibly antibiotics and delayed operation. Will need hematology involvement and consultation at the time surgery is scheduled.  Darrell Graham 09/23/2015, 7:19 AM

## 2015-09-23 NOTE — Progress Notes (Signed)
NURSING PROGRESS NOTE  [name] [id number] Admission Data: 09/23/2015 6:45 AM Attending Provider: Clydia Llano, MD YQM:VHQION, Clifton Custard, MD Code Status: Full  Darrell Graham is a 50 y.o. male patient admitted from ED:  -No acute distress noted.  -No complaints of shortness of breath.  -No complaints of chest pain.   Cardiac Monitoring: N/A  Blood pressure 111/64, pulse 94, temperature 98.5 F (36.9 C), temperature source Oral, resp. rate 18, height  (1.778 m), weight 70.7 kg (155 lb 13.8 oz), SpO2 99 %.   IV Fluids:  IV in place, occlusive dsg intact without redness, IV cath forearm left, condition patent and no redness and antecubital left, condition patent and no redness none.   Allergies:  Hemophilia support; Codeine; Gluten meal; Tetracyclines & related; and Wheat bran  Past Medical History:   has a past medical history of Hemophilia A (HCC); Hepatitis C virus; Glaucoma; Deviated nasal septum; Allergy; Asthma; Scoliosis; Celiac disease; Chronic arthropathy; H/O vitamin D deficiency; Telangiectasias; High blood pressure; Diverticulitis; Hernia; and OCD (obsessive compulsive disorder) (07/01/2015).  Past Surgical History:   has past surgical history that includes Ankle arthroscopy (1996).  Social History:   reports that he has never smoked. He has never used smokeless tobacco. He reports that he does not drink alcohol or use illicit drugs.  Skin: intact  Patient/Family orientated to room. Information packet given to patient/family. Admission inpatient armband information verified with patient/family to include name and date of birth and placed on patient arm. Side rails up x 2, fall assessment and education completed with patient/family. Patient/family able to verbalize understanding of risk associated with falls and verbalized understanding to call for assistance before getting out of bed. Call light within reach. Patient/family able to voice and demonstrate  understanding of unit orientation instructions.

## 2015-09-23 NOTE — Consult Note (Signed)
Urology Consult  Referring physician: Loleta Books, MD Reason for referral: Probable colovesical fistula  History of Present Illness: Darrell Graham is 50 years of age and without prior urologic history. For several days he began experiencing severe lower pelvic and lower abdominal discomfort. He thought initially he might have a an acute kidney stone although he has no prior history of nephrolithiasis. He began experiencing gross hematuria. He also had concurrent dysuria and then has had progressively worsening pneumaturia. He was admitted for presumptive febrile urinary tract infection with possible septicemia. CT imaging was performed without oral or IV contrast. This did show a thickened and inflamed appearing sigmoid colon with multiple diverticuli. There appeared to be radiographic evidence of acute diverticulitis with some associated bladder wall thickening. Colovesical fistula was a consideration radiographically. The patient was felt to have some mild bilateral hydroureteronephrosis. Darrell Graham does have a history of hemophilia A. He is currently on IV Rocephin and ciprofloxacin.  Past Medical History  Diagnosis Date  . Hemophilia A ([place])   . Hepatitis C virus     recent viral titer negative  . Glaucoma   . Deviated nasal septum   . Allergy   . Asthma   . Scoliosis   . Celiac disease   . Chronic arthropathy     left ankle & right elbow  . H/O vitamin D deficiency   . Telangiectasias     upper back and chest  . High blood pressure   . Diverticulitis   . Hernia   . OCD (obsessive compulsive disorder) 07/01/2015   Past Surgical History  Procedure Laterality Date  . Ankle arthroscopy  1996    Medications:  Scheduled: . ciprofloxacin  400 mg Intravenous Q12H  . metronidazole  500 mg Intravenous Q8H  . venlafaxine XR  37.5 mg Oral Q breakfast    Allergies:  Allergies  Allergen Reactions  . Hemophilia Support [Acid Blockers Support] Other (See Comments)    Hemophilia Factor  VIII  . Codeine Itching and Rash  . Gluten Meal Diarrhea, Itching and Rash    General lethargy  . Tetracyclines & Related Itching and Rash  . Wheat Bran Diarrhea, Itching and Rash    General lethargy    Family History  Problem Relation Age of Onset  . Hemophilia    . Hepatitis C    . Osteopenia    . Psoriasis    . GER disease    . Dementia Mother   . Migraines Father   . Stroke Father   . Heart disease Father     Social History:  reports that he has never smoked. He has never used smokeless tobacco. He reports that he does not drink alcohol or use illicit drugs.  ROS positive for the dysuria, hematuria, pneumaturia along with abdominal and pelvic discomfort. He is also had fever and some chills. Review of systems otherwise negative.   Physical Exam:  Vital signs in last 24 hours: Temp:  [98.5 F (36.9 C)-103 F (39.4 C)] 100.2 F (37.9 C) (02/20 1622) Pulse Rate:  [94-132] 113 (02/20 1622) Resp:  [13-23] 16 (02/20 1442) BP: (93-133)/(56-85) 107/65 mmHg (02/20 1622) SpO2:  [95 %-99 %] 97 % (02/20 1442) Weight:  [70.7 kg (155 lb 13.8 oz)-70.903 kg (156 lb 5 oz)] 70.7 kg (155 lb 13.8 oz) (02/20 1610)  Constitutional: Vital signs reviewed. WD WN in NAD Head: Normocephalic and atraumatic   Eyes: PERRL, No scleral icterus.  Neck: Supple No  Gross JVD  Cardiovascular: RRR Pulmonary/Chest:  Normal effort Abdominal: Soft. slight suprapubic tenderness. No obvious palpable masses. No gross distention. Nonsurgical abdomen.  Genitourinary:normal external genitalia.  Extremities: No cyanosis or edema  Neurological: Grossly non-focal.  Skin: Warm,very dry and intact. No rash, cyanosis   Laboratory Data:  Results for orders placed or performed during the hospital encounter of 09/22/15 (from the past 72 hour(s))  Basic metabolic panel     Status: Abnormal   Collection Time: 09/22/15  4:39 PM  Result Value Ref Range   Sodium 135 135 - 145 mmol/L   Potassium 4.0 3.5 - 5.1 mmol/L    Chloride 100 (L) 101 - 111 mmol/L   CO2 22 22 - 32 mmol/L   Glucose, Bld 155 (H) 65 - 99 mg/dL   BUN 12 6 - 20 mg/dL   Creatinine, Ser 0.99 0.61 - 1.24 mg/dL   Calcium 9.5 8.9 - 10.3 mg/dL   GFR calc non Af Amer >60 >60 mL/min   GFR calc Af Amer >60 >60 mL/min    Comment: (NOTE) The eGFR has been calculated using the CKD EPI equation. This calculation has not been validated in all clinical situations. eGFR's persistently <60 mL/min signify possible Chronic Kidney Disease.    Anion gap 13 5 - 15  CBC with Differential     Status: Abnormal   Collection Time: 09/22/15  4:39 PM  Result Value Ref Range   WBC 19.0 (H) 4.0 - 10.5 K/uL   RBC 4.13 (L) 4.22 - 5.81 MIL/uL   Hemoglobin 12.2 (L) 13.0 - 17.0 g/dL   HCT 37.5 (L) 39.0 - 52.0 %   MCV 90.8 78.0 - 100.0 fL   MCH 29.5 26.0 - 34.0 pg   MCHC 32.5 30.0 - 36.0 g/dL   RDW 12.7 11.5 - 15.5 %   Platelets 447 (H) 150 - 400 K/uL   Neutrophils Relative % 84 %   Lymphocytes Relative 8 %   Monocytes Relative 8 %   Eosinophils Relative 0 %   Basophils Relative 0 %   Neutro Abs 16.0 (H) 1.7 - 7.7 K/uL   Lymphs Abs 1.5 0.7 - 4.0 K/uL   Monocytes Absolute 1.5 (H) 0.1 - 1.0 K/uL   Eosinophils Absolute 0.0 0.0 - 0.7 K/uL   Basophils Absolute 0.0 0.0 - 0.1 K/uL   Smear Review LARGE PLATELETS PRESENT   I-Stat CG4 Lactic Acid, ED (Not at [facility])     Status: Abnormal   Collection Time: 09/22/15  4:55 PM  Result Value Ref Range   Lactic Acid, Venous 2.78 (HH) 0.5 - 2.0 mmol/L   Comment NOTIFIED PHYSICIAN   Blood Culture (routine x 2)     Status: None (Preliminary result)   Collection Time: 09/22/15  6:45 PM  Result Value Ref Range   Specimen Description LEFT ANTECUBITAL    Special Requests BOTTLES DRAWN AEROBIC AND ANAEROBIC 5ML    Culture NO GROWTH < 24 HOURS    Report Status PENDING   Blood Culture (routine x 2)     Status: None (Preliminary result)   Collection Time: 09/22/15  6:45 PM  Result Value Ref Range   Specimen Description LEFT  ANTECUBITAL    Special Requests BOTTLES DRAWN AEROBIC AND ANAEROBIC 5ML    Culture NO GROWTH < 24 HOURS    Report Status PENDING   Urinalysis, Routine w reflex microscopic-may I&O cath if menses (not at [facility])     Status: Abnormal   Collection Time: 09/22/15  7:15 PM  Result Value Ref Range  Color, Urine AMBER (A) YELLOW    Comment: BIOCHEMICALS MAY BE AFFECTED BY COLOR   APPearance TURBID (A) CLEAR   Specific Gravity, Urine 1.023 1.005 - 1.030   pH 5.5 5.0 - 8.0   Glucose, UA NEGATIVE NEGATIVE mg/dL   Hgb urine dipstick LARGE (A) NEGATIVE   Bilirubin Urine NEGATIVE NEGATIVE   Ketones, ur 15 (A) NEGATIVE mg/dL   Protein, ur 100 (A) NEGATIVE mg/dL   Nitrite NEGATIVE NEGATIVE   Leukocytes, UA MODERATE (A) NEGATIVE  Urine culture     Status: None (Preliminary result)   Collection Time: 09/22/15  7:15 PM  Result Value Ref Range   Specimen Description URINE, RANDOM    Special Requests NONE    Culture TOO YOUNG TO READ    Report Status PENDING   Urine microscopic-add on     Status: None   Collection Time: 09/22/15  7:15 PM  Result Value Ref Range   Squamous Epithelial / LPF NONE SEEN NONE SEEN   WBC, UA TOO NUMEROUS TO COUNT 0 - 5 WBC/hpf   RBC / HPF 6-30 0 - 5 RBC/hpf   Bacteria, UA NONE SEEN NONE SEEN  Lactic acid, plasma     Status: None   Collection Time: 09/22/15 11:05 PM  Result Value Ref Range   Lactic Acid, Venous 0.7 0.5 - 2.0 mmol/L  Lactic acid, plasma     Status: None   Collection Time: 09/23/15  1:52 AM  Result Value Ref Range   Lactic Acid, Venous 1.1 0.5 - 2.0 mmol/L  Comprehensive metabolic panel     Status: Abnormal   Collection Time: 09/23/15  1:52 AM  Result Value Ref Range   Sodium 141 135 - 145 mmol/L   Potassium 3.6 3.5 - 5.1 mmol/L   Chloride 108 101 - 111 mmol/L   CO2 25 22 - 32 mmol/L   Glucose, Bld 121 (H) 65 - 99 mg/dL   BUN 7 6 - 20 mg/dL   Creatinine, Ser 0.79 0.61 - 1.24 mg/dL   Calcium 8.6 (L) 8.9 - 10.3 mg/dL   Total Protein 6.7 6.5 - 8.1  g/dL   Albumin 3.1 (L) 3.5 - 5.0 g/dL   AST 16 15 - 41 U/L   ALT 11 (L) 17 - 63 U/L   Alkaline Phosphatase 53 38 - 126 U/L   Total Bilirubin 0.6 0.3 - 1.2 mg/dL   GFR calc non Af Amer >60 >60 mL/min   GFR calc Af Amer >60 >60 mL/min    Comment: (NOTE) The eGFR has been calculated using the CKD EPI equation. This calculation has not been validated in all clinical situations. eGFR's persistently <60 mL/min signify possible Chronic Kidney Disease.    Anion gap 8 5 - 15  CBC     Status: Abnormal   Collection Time: 09/23/15  1:52 AM  Result Value Ref Range   WBC 16.5 (H) 4.0 - 10.5 K/uL   RBC 3.46 (L) 4.22 - 5.81 MIL/uL   Hemoglobin 10.1 (L) 13.0 - 17.0 g/dL   HCT 31.3 (L) 39.0 - 52.0 %   MCV 90.5 78.0 - 100.0 fL   MCH 29.2 26.0 - 34.0 pg   MCHC 32.3 30.0 - 36.0 g/dL   RDW 12.9 11.5 - 15.5 %   Platelets 356 150 - 400 K/uL  Glucose, capillary     Status: Abnormal   Collection Time: 09/23/15 11:43 AM  Result Value Ref Range   Glucose-Capillary 111 (H) 65 - 99 mg/dL  Recent Results (from the past 240 hour(s))  Blood Culture (routine x 2)     Status: None (Preliminary result)   Collection Time: 09/22/15  6:45 PM  Result Value Ref Range Status   Specimen Description LEFT ANTECUBITAL  Final   Special Requests BOTTLES DRAWN AEROBIC AND ANAEROBIC 5ML  Final   Culture NO GROWTH < 24 HOURS  Final   Report Status PENDING  Incomplete  Blood Culture (routine x 2)     Status: None (Preliminary result)   Collection Time: 09/22/15  6:45 PM  Result Value Ref Range Status   Specimen Description LEFT ANTECUBITAL  Final   Special Requests BOTTLES DRAWN AEROBIC AND ANAEROBIC 5ML  Final   Culture NO GROWTH < 24 HOURS  Final   Report Status PENDING  Incomplete  Urine culture     Status: None (Preliminary result)   Collection Time: 09/22/15  7:15 PM  Result Value Ref Range Status   Specimen Description URINE, RANDOM  Final   Special Requests NONE  Final   Culture TOO YOUNG TO READ  Final    Report Status PENDING  Incomplete   Creatinine:  Recent Labs  09/22/15 1639 09/23/15 0152  CREATININE 0.99 0.79   Baseline Creatinine:   Impression/Assessment: probable colovesical fistula. Patient currently needs continued IV antibiotics to cool off his situation. Definitive surgery will likely be delayed until there has been some improvement in the acute diverticulitis. Really he will be able to undergo a bowel prep. Additional imaging with bowel contrast or colonoscopy will be deferred to the decision of general surgery. Urologically we will be available if necessary for bladder closure. Commonly general surgery is able to separate the colon and bladder and do any necessary bladder repairs without urologic consultation. We will be happy to be available if needed. We can also place ureteral stents prior to surgery as needed to aid in ureteral identification.   Plan:  As above.  Roxanne Panek S 09/23/2015, 5:16 PM

## 2015-09-23 NOTE — Progress Notes (Signed)
Re-checked temp 100.2, BP 107/65, HR 113. Paged Dr Arthor Captain. Waiting to hear back from dr. No complaints per pt at this time.

## 2015-09-23 NOTE — Care Management Note (Addendum)
Case Management Note  Patient Details  Name: NATHYN LUIZ MRN: [id number] Date of Birth: 1966/04/01  Subjective/Objective:                Admitted with fever, hematuria. Hx  significant for hemophilia A, hep C undetec viral load, and celiac. Lives with mom. Independent with ADL's PTA. No DME usage. PCP: Donneta Romberg.   Action/Plan:   Per general surgery: Diverticulitis with colovesicle fistula - needs bowel rest and IV ABX Return to home when medically stable. CM to f/u with disposition needs.  Expected Discharge Date:                  Expected Discharge Plan:  Home/Self Care  In-House Referral:     Discharge planning Services  CM Consult  Post Acute Care Choice:    Choice offered to:     DME Arranged:    DME Agency:     HH Arranged:    HH Agency:     Status of Service:  In process, will continue to follow  Medicare Important Message Given:    Date Medicare IM Given:    Medicare IM give by:    Date Additional Medicare IM Given:    Additional Medicare Important Message give by:     If discussed at Long Length of Stay Meetings, dates discussed:    Additional Com  Obert Espindola (Mother) [phone]  Epifanio Lesches, RN 09/23/2015, 3:03 PM

## 2015-09-23 NOTE — Progress Notes (Signed)
Temp 100.3, BP 93/56, HR 103. Paged Dr Arthor Captain, who ordered to continue to monitor pt. Will re-check.

## 2015-09-24 ENCOUNTER — Encounter (HOSPITAL_COMMUNITY): Payer: Self-pay | Admitting: General Practice

## 2015-09-24 LAB — CBC
HCT: 32.4 % — ABNORMAL LOW (ref 39.0–52.0)
Hemoglobin: 10.5 g/dL — ABNORMAL LOW (ref 13.0–17.0)
MCH: 29.4 pg (ref 26.0–34.0)
MCHC: 32.4 g/dL (ref 30.0–36.0)
MCV: 90.8 fL (ref 78.0–100.0)
Platelets: 326 10*3/uL (ref 150–400)
RBC: 3.57 MIL/uL — ABNORMAL LOW (ref 4.22–5.81)
RDW: 12.8 % (ref 11.5–15.5)
WBC: 5.9 10*3/uL (ref 4.0–10.5)

## 2015-09-24 LAB — HCV RNA QUANT: HCV Quantitative: NOT DETECTED IU/mL (ref >50)

## 2015-09-24 MED ORDER — PNEUMOCOCCAL VAC POLYVALENT 25 MCG/0.5ML IJ INJ
0.5000 mL | INJECTION | INTRAMUSCULAR | Status: DC
  Filled 2015-09-24 (×2): qty 0.5

## 2015-09-24 MED ORDER — PIPERACILLIN-TAZOBACTAM 3.375 G IVPB
3.3750 g | Freq: Three times a day (TID) | INTRAVENOUS | Status: AC
  Administered 2015-09-24 – 2015-09-26 (×8): 3.375 g via INTRAVENOUS
  Filled 2015-09-24 (×8): qty 50

## 2015-09-24 NOTE — Progress Notes (Signed)
Pt has had 4 loose stools today. Made Dr Arthor Captain aware. Waiting to hear back from dr. Continuing to monitor pt.

## 2015-09-24 NOTE — Progress Notes (Signed)
[facility] PROGRESS NOTE  Darrell Graham ZOX:[id number] DOB: Aug 28, 1965 DOA: 09/22/2015 PCP: Farris Has, MD  Assessment/Plan: Sepsis  Present on admission fever of 103, WBC of 16.5 and diverticulitis. Protocol initiated in the ED due to fever, tachycardia, leukocytosis, elevated lactate. Treatment with IVF, Ciprofloxacin, Metronidazole initiated 2/19. Pt continued with fever of 102.5 at 0530 this AM, antibiotic switched to Zosyn. Blood work from this morning did show WBC count improvement to 5.9.  Blood and urine cultures pending.  Colovesicular fistula - CT scan showed wall thickening of urinary bladder with air present as well as wall thickening of the sigmoid colon with multiple colonic diverticula. Loss of fat plane between sigmoid colon and urinary bladder suspicious for colovesicular fistula.   Pt received IVF and 24 hours of Ciprofloxacin and Metronidazole, however he continued with fever so antibiotic was switched to Zosyn. His WBC count improved to 5.9. Plan is for continued antibiotics and bowel rest with clear liquid diet, advance as tolerated.   General surgery evaluated with pt and would like him to receive colonoscopy as an outpatient to rule out mass once inflammation improves. Then the plan will be for single stage colectomy with laparoscopic or robotic approach in a few weeks. Urology evaluated the pt and will assist general surgery, if needed.  Urinary tract infection - Likely due to the above, treatment is with antibiotics initiated above  Diverticulitis - Sips of clears, bowel rest to reduce inflammation. Advance diet as tolerated.  Hemophilia - Factor VIII deficiency, will need supplementation prior to surgery  Code Status: Full code Family Communication: Pt using cell phone at bedside Disposition Plan: Admit   Consultants:  Urology  General Surgery  Procedures:  None  Antibiotics:  Zosyn  HPI/Subjective: 50 year old male with a history of  hemophilia A, hepatitis C (undetectable viral load), divirticulitis, and celiac disease here for sepsis, UTI, and colovesicular fistula. Presented with fever of 103, WBC 16.5, and diverticulitis. Received IVF and 24 hours of ciprofloxacin and metronidazole. Continued with fever of 102.5 at 0530 this AM, antibiotic switched to Zosyn. WBC count improved to 5.9. Pt states he feels "50% better" in comparison to yesterday. Will continue to monitor for fevers.  General surgery and urology following the pt. Plan is to continue antibiotics and bowel rest with clear liquid diet, advance as tolerated. Colonoscopy as an outpatient once inflammation has improved. Single stage colectomy with laparoscopic or robotic approach in a few weeks. Urology evaluated the pt and will assist general surgery, if needed.   Objective: Filed Vitals:   09/24/15 0653 09/24/15 0713  BP:    Pulse:    Temp: 99.3 F (37.4 C) 100 F (37.8 C)  Resp:      Intake/Output Summary (Last 24 hours) at 09/24/15 1048 Last data filed at 09/24/15 0727  Gross per 24 hour  Intake 1668.33 ml  Output    480 ml  Net 1188.33 ml   Filed Weights   09/22/15 1617 09/22/15 1824 09/23/15 0611  Weight: 74.844 kg (165 lb) 70.903 kg (156 lb 5 oz) 70.7 kg (155 lb 13.8 oz)    Exam:   General:  Alert and oriented, in no acute distress, resting comfortably  Cardiovascular: Tachycardia  Respiratory: CTAB  Abdomen: Soft, non-distended with mild suprapubic tenderness  Musculoskeletal: No obvious swelling or deformities   Data Reviewed: Basic Metabolic Panel:  Recent Labs Lab 09/22/15 1639 09/23/15 0152  NA 135 141  K 4.0 3.6  CL 100* 108  CO2 22 25  GLUCOSE 155* 121*  BUN 12 7  CREATININE 0.99 0.79  CALCIUM 9.5 8.6*   Liver Function Tests:  Recent Labs Lab 09/23/15 0152  AST 16  ALT 11*  ALKPHOS 53  BILITOT 0.6  PROT 6.7  ALBUMIN 3.1*   No results for input(s): LIPASE, AMYLASE in the last 168 hours. No results for  input(s): AMMONIA in the last 168 hours. CBC:  Recent Labs Lab 09/22/15 1639 09/23/15 0152 09/24/15 0704  WBC 19.0* 16.5* 5.9  NEUTROABS 16.0*  --   --   HGB 12.2* 10.1* 10.5*  HCT 37.5* 31.3* 32.4*  MCV 90.8 90.5 90.8  PLT 447* 356 326   Cardiac Enzymes: No results for input(s): CKTOTAL, CKMB, CKMBINDEX, TROPONINI in the last 168 hours. BNP (last 3 results) No results for input(s): BNP in the last 8760 hours.  ProBNP (last 3 results) No results for input(s): PROBNP in the last 8760 hours.  CBG:  Recent Labs Lab 09/23/15 1143  GLUCAP 111*    Recent Results (from the past 240 hour(s))  Blood Culture (routine x 2)     Status: None (Preliminary result)   Collection Time: 09/22/15  6:45 PM  Result Value Ref Range Status   Specimen Description LEFT ANTECUBITAL  Final   Special Requests BOTTLES DRAWN AEROBIC AND ANAEROBIC  Final   Culture NO GROWTH < 24 HOURS  Final   Report Status PENDING  Incomplete  Blood Culture (routine x 2)     Status: None (Preliminary result)   Collection Time: 09/22/15  6:45 PM  Result Value Ref Range Status   Specimen Description LEFT ANTECUBITAL  Final   Special Requests BOTTLES DRAWN AEROBIC AND ANAEROBIC  Final   Culture NO GROWTH < 24 HOURS  Final   Report Status PENDING  Incomplete  Urine culture     Status: None (Preliminary result)   Collection Time: 09/22/15  7:15 PM  Result Value Ref Range Status   Specimen Description URINE, RANDOM  Final   Special Requests NONE  Final   Culture TOO YOUNG TO READ  Final   Report Status PENDING  Incomplete     Studies: Ct Renal Stone Study  09/22/2015  CLINICAL DATA:  Hematuria. Urinary tract infection. Evaluate for urolithiasis. Urinary pain for 1 month. EXAM: CT ABDOMEN AND PELVIS WITHOUT CONTRAST TECHNIQUE: Multidetector CT imaging of the abdomen and pelvis was performed following the standard protocol without IV contrast. COMPARISON:  CT 12/01/2005 FINDINGS: Lower chest: The included  lung bases are clear. No pleural effusion. Liver: No focal lesion allowing for lack contrast. Hepatobiliary: Gallbladder physiologically distended, no calcified stone. No biliary dilatation. Pancreas: No ductal dilatation or inflammation. Spleen: Normal. Adrenal glands: No nodule. Kidneys: Mild bilateral hydronephrosis. Both ureters are dilated to the bladder insertion, right greater than left. No urolithiasis. Focal cortical scarring in the mid upper right kidney. Stomach/Bowel: Stomach physiologically distended. There are no dilated or thickened small bowel loops. Wall thickening and stranding about the sigmoid colon with multiple colonic diverticula. This is in the region of prior acute diverticulitis on remote prior exam. Fat plane between the sigmoid colon and left urinary bladder dome is obscured, there is associated bladder wall thickening. No free air or extraluminal abscess. The appendix is normal. Vascular/Lymphatic: No retroperitoneal adenopathy. Abdominal aorta is normal in caliber. Reproductive: Prostate gland appears prominent in age measuring 4.9 cm transverse dimension. Seminal vesicles are prominent. Bladder: Mildly distended with wall thickening involving the posterior left aspect. Wall thickening measures up  to 1.5 cm and is adjacent to sigmoid colonic inflammation. There is air within the urinary bladder. Perivesicular stranding about the bladder dome. Other: Pelvic soft tissue stranding primarily about the sigmoid colon, no discrete abscess or free fluid. No free air. Musculoskeletal: There are no acute or suspicious osseous abnormalities. IMPRESSION: 1. Left lower quadrant inflammation with sigmoid colonic wall thickening and fat stranding in the region of multiple diverticula. There is loss of normal fat plane between the sigmoid colon and urinary bladder with associated bladder wall thickening as well as air within the bladder lumen. Colovesicular fistula is considered, either secondary to  diverticulitis or colonic malignancy. Alternatively, bladder wall thickening could be secondary to infection or bladder neoplasm and air introduced iatrogenically. Colonoscopy and possibly cystoscopy recommended. 2. Mild bilateral hydroureteronephrosis without urolithiasis. 3. Cortical scarring in the right kidney, unchanged. Electronically Signed   By: Rubye Oaks M.D.   On: 09/22/2015 20:58    Scheduled Meds: . piperacillin-tazobactam (ZOSYN)  IV  3.375 g Intravenous Q8H  . venlafaxine XR  37.5 mg Oral Q breakfast   Continuous Infusions: . dextrose 5 % and 0.9% NaCl 1,000 mL infusion 100 mL/hr at 09/24/15 0343    Principal Problem:   Sepsis (H[facility]) Active Problems:   Hemophilia (HCC)   Headache   OCD (obsessive compulsive disorder)   UTI (lower urinary tract infection)      Darrell Roes, PA-S [facility] Pager [phone]. If 7PM-7AM, please contact night-coverage at [email or web address], password [facility] 09/24/2015, 10:48 AM  LOS: 2 days      Clint Lipps Pager: [phone] 09/24/2015, 3:16 PM

## 2015-09-24 NOTE — Progress Notes (Addendum)
Initial Nutrition Assessment  DOCUMENTATION CODES:   Not applicable  INTERVENTION:   -RD will follow for diet advancement and supplement diet  as appropriate  NUTRITION DIAGNOSIS:   Inadequate oral intake related to altered GI function as evidenced by  (clear liquid diet).  GOAL:   Patient will meet greater than or equal to 90% of their needs  MONITOR:   PO intake, Supplement acceptance, Diet advancement, Labs, Weight trends, Skin, I & O's  REASON FOR ASSESSMENT:   Malnutrition Screening Tool    ASSESSMENT:   Darrell Graham is a 50 y.o. male with a past medical history significant for hemophilia A, hep C undetec viral load, and celiac who presents with hematuria and fever.  Pt admitted with sepsis and diverticulitis with colovesicle fistula.   Pt was sitting in bed, on phone, smiling and joking at times of visits x2. Unable to participate in interview or complete nutrition-focused physical exam at this time. However, pt did not appear to have signs of fat or muscle depletion on upper regions of his body.   Reviewed wt hx, which reveals wt fluctuations at baseline. UBW around 160#, but ranges from 155-170 over the past 3 years. Noted a 15# (8.8%) wt loss x 3 months, which is significant.   Pt is currently on a clear liquid diet. No intake data currently available. Per RN, pt has had 4 loose bowel movements this shift. MD has been notified.   Reviewed surgical notes. Plan is continue IV antibiotics and advance diet slowly. Pt will need colonoscopy and colectomy in the future.   Per chart review, pt with celiac disease and follows a gluten free diet at home.   Labs reviewed.   Diet Order:  Diet clear liquid Room service appropriate?: Yes; Fluid consistency:: Thin  Skin:  Reviewed, no issues  Last BM:  09/23/15  Height:   Ht Readings from Last 1 Encounters:  09/22/15  (1.778 m)    Weight:   Wt Readings from Last 1 Encounters:  09/23/15 155 lb 13.8 oz  (70.7 kg)    Ideal Body Weight:  75.5 kg  BMI:  Body mass index is 22.36 kg/(m^2).  Estimated Nutritional Needs:   Kcal:  1900-2100  Protein:  95-110 grams  Fluid:  1.9-2.1 L  EDUCATION NEEDS:   No education needs identified at this time  Maury Bamba A. Mayford Knife, RD, LDN, CDE Pager: [phone] After hours Pager: [phone]

## 2015-09-24 NOTE — Progress Notes (Signed)
  Subjective: Feeling a little better  Objective: Vital signs in last 24 hours: Temp:  [99 F (37.2 C)-103.1 F (39.5 C)] 100 F (37.8 C) (02/21 0713) Pulse Rate:  [103-132] 114 (02/21 0528) Resp:  [16-20] 16 (02/21 0528) BP: (93-124)/(54-83) 103/54 mmHg (02/21 0528) SpO2:  [93 %-99 %] 94 % (02/21 0528) Last BM Date: 09/23/15  Intake/Output from previous day: 02/20 0701 - 02/21 0700 In: 1668.3 [I.V.:1668.3] Out: 475 [Urine:475] Intake/Output this shift: Total I/O In: -  Out: 180 [Urine:180]  General appearance: alert and cooperative Resp: clear to auscultation bilaterally GI: soft, minimal LLQ/suprapubic tenderness  Lab Results:   Recent Labs  09/23/15 0152 09/24/15 0704  WBC 16.5* 5.9  HGB 10.1* 10.5*  HCT 31.3* 32.4*  PLT 356 326   BMET  Recent Labs  09/22/15 1639 09/23/15 0152  NA 135 141  K 4.0 3.6  CL 100* 108  CO2 22 25  GLUCOSE 155* 121*  BUN 12 7  CREATININE 0.99 0.79  CALCIUM 9.5 8.6*   PT/INR No results for input(s): LABPROT, INR in the last 72 hours. ABG No results for input(s): PHART, HCO3 in the last 72 hours.  Invalid input(s): PCO2, PO2  Anti-infectives: Anti-infectives    Start     Dose/Rate Route Frequency Ordered Stop   09/23/15 1800  cefTRIAXone (ROCEPHIN) 1 g in dextrose 5 % 50 mL IVPB  Status:  Discontinued     1 g 100 mL/hr over 30 Minutes Intravenous Every 24 hours 09/22/15 1828 09/22/15 2153   09/22/15 2200  ciprofloxacin (CIPRO) IVPB 400 mg  Status:  Discontinued     400 mg 200 mL/hr over 60 Minutes Intravenous Every 12 hours 09/22/15 2153 09/24/15 0800   09/22/15 2200  metroNIDAZOLE (FLAGYL) IVPB 500 mg  Status:  Discontinued     500 mg 100 mL/hr over 60 Minutes Intravenous Every 8 hours 09/22/15 2153 09/24/15 0800   09/22/15 1830  cefTRIAXone (ROCEPHIN) 2 g in dextrose 5 % 50 mL IVPB     2 g 100 mL/hr over 30 Minutes Intravenous  Once 09/22/15 1822 09/22/15 1945      Assessment/Plan: Sigmoid diverticulitis  with colovesicle fistula - WBC normal but still febrile. Clear liquid diet today. Urology input noted. I D/W Dr. Maisie Fus, the colorectal specialist at our practice. Plan continue ABX, gradually advance diet. We will follow daily. After D/C he will see Dr. Maisie Fus at [facility]. He will also need a colonoscopy to R/O mass but this must be done outpatient after his colon cools off significantly. Then the plan will be for single stage colectomy with laparoscopic or robotic approach.  LOS: 2 days    Taiquan Campanaro E 09/24/2015

## 2015-09-24 NOTE — Progress Notes (Signed)
Pharmacy Antibiotic Note  Darrell Graham is a 50 y.o. male admitted on 09/22/2015 with sigmoid diverticulitis with probable colovesical fistula.  Pharmacy has been consulted for Zosyn dosing. WBC improved but still febrile.  Plan: Zosyn 3.375g IV q8h (4 hour infusion).  F/u culture results    Height:  (177.8 cm) Weight: 155 lb 13.8 oz (70.7 kg) IBW/kg (Calculated) : 73  Temp (24hrs), Avg:101.2 F (38.4 C), Min:99 F (37.2 C), Max:103.1 F (39.5 C)   Recent Labs Lab 09/22/15 1639 09/22/15 1655 09/22/15 2305 09/23/15 0152 09/24/15 0704  WBC 19.0*  --   --  16.5* 5.9  CREATININE 0.99  --   --  0.79  --   LATICACIDVEN  --  2.78* 0.7 1.1  --     Estimated Creatinine Clearance: 114.2 mL/min (by C-G formula based on Cr of 0.79).    Allergies  Allergen Reactions  . Hemophilia Support [Acid Blockers Support] Other (See Comments)    Hemophilia Factor VIII  . Codeine Itching and Rash  . Gluten Meal Diarrhea, Itching and Rash    General lethargy  . Tetracyclines & Related Itching and Rash  . Wheat Bran Diarrhea, Itching and Rash    General lethargy    Antimicrobials this admission: Ceftriaxone 2g x1 2/19  Cipro 2/20>>2/20  Flagyl 2/20>>  Zosyn 2/21>>  Dose adjustments this admission: none  Microbiology results: 2/19 UCx: 80K E coli  2/19 BCx2: ngtd  Thank you for allowing pharmacy to be a part of this patient's care.  [place], [address].D., BCPS Clinical Pharmacist Pager: [phone] 09/24/2015 9:29 AM

## 2015-09-25 DIAGNOSIS — D66 Hereditary factor VIII deficiency: Secondary | ICD-10-CM

## 2015-09-25 DIAGNOSIS — A419 Sepsis, unspecified organism: Principal | ICD-10-CM

## 2015-09-25 DIAGNOSIS — K5732 Diverticulitis of large intestine without perforation or abscess without bleeding: Secondary | ICD-10-CM | POA: Diagnosis present

## 2015-09-25 DIAGNOSIS — N321 Vesicointestinal fistula: Secondary | ICD-10-CM | POA: Diagnosis present

## 2015-09-25 DIAGNOSIS — N390 Urinary tract infection, site not specified: Secondary | ICD-10-CM

## 2015-09-25 LAB — URINALYSIS, ROUTINE W REFLEX MICROSCOPIC
Bilirubin Urine: NEGATIVE
Glucose, UA: NEGATIVE mg/dL
Ketones, ur: 15 mg/dL — AB
Nitrite: NEGATIVE
Protein, ur: 30 mg/dL — AB
Specific Gravity, Urine: 1.015 (ref 1.005–1.030)
pH: 7.5 (ref 5.0–8.0)

## 2015-09-25 LAB — URINE CULTURE: Culture: 80000

## 2015-09-25 LAB — URINE MICROSCOPIC-ADD ON

## 2015-09-25 NOTE — Progress Notes (Signed)
Subjective: Still having some pain, he is very hungry and complaining of headache from it.  Anxious to convert to PO antibiotics and go home.  Fever last PM.  He says blood in urine is less says he is having some air and "chunks" with voiding still.  Objective: Vital signs in last 24 hours: Temp:  [98.4 F (36.9 C)-101.1 F (38.4 C)] 98.4 F (36.9 C) (02/22 0514) Pulse Rate:  [77-124] 77 (02/22 0514) Resp:  [16] 16 (02/22 0514) BP: (91-124)/(56-76) 91/56 mmHg (02/22 0514) SpO2:  [95 %-100 %] 98 % (02/22 0514) Last BM Date: 09/24/15 1180 PO recorded  Clears for diet 500 urine 500 PO Temp up to 101.1 early AM 0100, down now CBC was normal yesterday. E coli from urine on 03/24/16 Intake/Output from previous day: 02/21 0701 - 02/22 0700 In: 1580 [P.O.:1180; I.V.:400] Out: 500 [Urine:500] Intake/Output this shift:    General appearance: alert, cooperative, no distress and anxious to progress GI: soft, minimal tenderness.    Lab Results:   Recent Labs  09/23/15 0152 09/24/15 0704  WBC 16.5* 5.9  HGB 10.1* 10.5*  HCT 31.3* 32.4*  PLT 356 326    BMET  Recent Labs  09/22/15 1639 09/23/15 0152  NA 135 141  K 4.0 3.6  CL 100* 108  CO2 22 25  GLUCOSE 155* 121*  BUN 12 7  CREATININE 0.99 0.79  CALCIUM 9.5 8.6*   PT/INR No results for input(s): LABPROT, INR in the last 72 hours.   Recent Labs Lab 09/23/15 0152  AST 16  ALT 11*  ALKPHOS 53  BILITOT 0.6  PROT 6.7  ALBUMIN 3.1*     Lipase  No results found for: LIPASE   Studies/Results: No results found.  Medications: . piperacillin-tazobactam (ZOSYN)  IV  3.375 g Intravenous Q8H  . pneumococcal 23 valent vaccine  0.5 mL Intramuscular Tomorrow-1000  . venlafaxine XR  37.5 mg Oral Q breakfast   . dextrose 5 % and 0.9% NaCl 1,000 mL infusion 100 mL/hr at 09/25/15 0825   Prior to Admission medications   Medication Sig Start Date End Date Taking? Authorizing Provider  acetaminophen (TYLENOL) 325 MG  tablet Take 650 mg by mouth daily as needed for mild pain, fever or headache.   Yes Historical Provider, MD  ALPRAZolam Duanne Moron) 0.25 MG tablet Take 0.25 mg by mouth at bedtime as needed for anxiety.   Yes Historical Provider, MD  amLODipine (NORVASC) 5 MG tablet Take 5 mg by mouth every evening.    Yes Historical Provider, MD  Antihemophilic Factor rAHF-PAF (XYNTHA SOLOFUSE) 3000 UNITS KIT Inject 3,000 Units into the vein as needed. Patient taking differently: Inject 3,000 Units into the vein as needed (for bleeding).  07/22/15  Yes Ladell Pier, MD  cholecalciferol (VITAMIN D) 1000 UNITS tablet Take 1,000 Units by mouth every evening.    Yes Historical Provider, MD  Dapsone (ACZONE EX) Apply 1 application topically daily as needed (for acne).    Yes Historical Provider, MD  Multiple Vitamins-Minerals (MULTIVITAMIN PO) Take 1 tablet by mouth daily.   Yes Historical Provider, MD  Naphazoline-Pheniramine (OPCON-A) 0.027-0.315 % SOLN Apply 1 drop to eye daily as needed (for dry eyes).   Yes Historical Provider, MD  valsartan (DIOVAN) 160 MG tablet 160 mg daily. 12/22/13  Yes Historical Provider, MD  venlafaxine XR (EFFEXOR XR) 37.5 MG 24 hr capsule Take 1 capsule (37.5 mg total) by mouth daily with breakfast. 07/02/15  Yes Kathrynn Ducking, MD  Assessment/Plan Sigmoid diverticulitis with colovesical fistula  UTI Hemophilia Factor 8 Deficiency Hepatitis C Celiac disease OCD Antibiotics:  2 days of Cipro/Flagyl, started on Zosyn 09/24/15 day 2 Zosyn DVT:  SCD only   He is very anxious to go home.  I told him I would be reluctant to switch him over to oral antibiotics till he is pain free, not having fevers and his WBC is normal.  I told him I would discuss advancing diet with Dr. Grandville Silos. Check CBC tomorrow, recheck urine today.    LOS: 3 days    Savanna Dooley 09/25/2015

## 2015-09-25 NOTE — Progress Notes (Signed)
[facility] PROGRESS NOTE  Darrell Graham ZOX:[id number] DOB: 11/07/1965 DOA: 09/22/2015 PCP: Farris Has, MD  Assessment/Plan: Sepsis  -due to Sigmoid diverticulitis and colo-vesical fistula -improving -continue IV Zosyn -[facility] and Urology consulting -Blood cultures negative, urine cultures with Escherichia coli resistant to quinolones -Continue clears, supportive care per [facility] at this time -Plan for interval colonoscopy followed by single-stage colectomy as outpatient per [facility], will need Factor 8 pre-op and post op  Colovesicular fistula - CT scan showed wall thickening of urinary bladder with air present as well as wall thickening of the sigmoid colon with multiple colonic diverticula. Loss of fat plane between sigmoid colon and urinary bladder suspicious for colovesicular fistula.  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 prior to surgery and post op, followed by Hematology at [facility]  Code Status: Full code Family Communication:none at bedside Disposition Plan: Home when cleared by [facility]   Consultants:  Urology  General Surgery  Procedures:  None  Antibiotics:  Zosyn  HPI/Subjective: Having freq stools, still with having some chunks in urine   Objective: Filed Vitals:   09/25/15 0514 09/25/15 1052  BP: 91/56 122/76  Pulse: 77 103  Temp: 98.4 F (36.9 C) 99.3 F (37.4 C)  Resp: 16 18    Intake/Output Summary (Last 24 hours) at 09/25/15 1408 Last data filed at 09/25/15 0655  Gross per 24 hour  Intake    740 ml  Output    320 ml  Net    420 ml   Filed Weights   09/22/15 1617 09/22/15 1824 09/23/15 0611  Weight: 74.844 kg (165 lb) 70.903 kg (156 lb 5 oz) 70.7 kg (155 lb 13.8 oz)    Exam:   General:  Alert and oriented, in no acute distress, resting comfortably  Cardiovascular: Tachycardia  Respiratory: CTAB  Abdomen: Soft, non-distended with  mild suprapubic tenderness  Musculoskeletal: No obvious swelling or deformities   Data Reviewed: Basic Metabolic Panel:  Recent Labs Lab 09/22/15 1639 09/23/15 0152  NA 135 141  K 4.0 3.6  CL 100* 108  CO2 22 25  GLUCOSE 155* 121*  BUN 12 7  CREATININE 0.99 0.79  CALCIUM 9.5 8.6*   Liver Function Tests:  Recent Labs Lab 09/23/15 0152  AST 16  ALT 11*  ALKPHOS 53  BILITOT 0.6  PROT 6.7  ALBUMIN 3.1*   No results for input(s): LIPASE, AMYLASE in the last 168 hours. No results for input(s): AMMONIA in the last 168 hours. CBC:  Recent Labs Lab 09/22/15 1639 09/23/15 0152 09/24/15 0704  WBC 19.0* 16.5* 5.9  NEUTROABS 16.0*  --   --   HGB 12.2* 10.1* 10.5*  HCT 37.5* 31.3* 32.4*  MCV 90.8 90.5 90.8  PLT 447* 356 326   Cardiac Enzymes: No results for input(s): CKTOTAL, CKMB, CKMBINDEX, TROPONINI in the last 168 hours. BNP (last 3 results) No results for input(s): BNP in the last 8760 hours.  ProBNP (last 3 results) No results for input(s): PROBNP in the last 8760 hours.  CBG:  Recent Labs Lab 09/23/15 1143  GLUCAP 111*    Recent Results (from the past 240 hour(s))  Blood Culture (routine x 2)     Status: None (Preliminary result)   Collection Time: 09/22/15  6:45 PM  Result Value Ref Range Status   Specimen Description BLOOD LEFT ANTECUBITAL  Final   Special Requests BOTTLES DRAWN AEROBIC AND ANAEROBIC  Final   Culture NO GROWTH 3 DAYS  Final   Report Status PENDING  Incomplete  Blood Culture (routine x 2)     Status: None (Preliminary result)   Collection Time: 09/22/15  6:45 PM  Result Value Ref Range Status   Specimen Description BLOOD LEFT ANTECUBITAL  Final   Special Requests BOTTLES DRAWN AEROBIC AND ANAEROBIC  Final   Culture NO GROWTH 3 DAYS  Final   Report Status PENDING  Incomplete  Urine culture     Status: None   Collection Time: 09/22/15  7:15 PM  Result Value Ref Range Status   Specimen Description URINE, RANDOM  Final    Special Requests NONE  Final   Culture 80,000 COLONIES/ml ESCHERICHIA COLI  Final   Report Status 09/25/2015 FINAL  Final   Organism ID, Bacteria ESCHERICHIA COLI  Final      Susceptibility   Escherichia coli - MIC*    AMPICILLIN 4 SENSITIVE Sensitive     CEFAZOLIN <=4 SENSITIVE Sensitive     CEFTRIAXONE <=1 SENSITIVE Sensitive     CIPROFLOXACIN >=4 RESISTANT Resistant     GENTAMICIN 2 SENSITIVE Sensitive     IMIPENEM <=0.25 SENSITIVE Sensitive     NITROFURANTOIN <=16 SENSITIVE Sensitive     TRIMETH/SULFA >=320 RESISTANT Resistant     AMPICILLIN/SULBACTAM <=2 SENSITIVE Sensitive     PIP/TAZO <=4 SENSITIVE Sensitive     * 80,000 COLONIES/ml ESCHERICHIA COLI     Studies: No results found.  Scheduled Meds: . piperacillin-tazobactam (ZOSYN)  IV  3.375 g Intravenous Q8H  . pneumococcal 23 valent vaccine  0.5 mL Intramuscular Tomorrow-1000  . venlafaxine XR  37.5 mg Oral Q breakfast   Continuous Infusions: . dextrose 5 % and 0.9% NaCl 1,000 mL infusion 100 mL/hr at 09/25/15 0825    Principal Problem:   Sepsis (HCC) Active Problems:   Hemophilia (HCC)   Headache   OCD (obsessive compulsive disorder)   UTI (lower urinary tract infection)      Zannie Cove, PA-S [facility] Pager [phone]. If 7PM-7AM, please contact night-coverage at [email or web address], password [facility] 09/25/2015, 2:08 PM  LOS: 3 days  , 2:08 PM

## 2015-09-26 DIAGNOSIS — N321 Vesicointestinal fistula: Secondary | ICD-10-CM

## 2015-09-26 DIAGNOSIS — K5732 Diverticulitis of large intestine without perforation or abscess without bleeding: Secondary | ICD-10-CM

## 2015-09-26 LAB — CBC
HCT: 31.6 % — ABNORMAL LOW (ref 39.0–52.0)
Hemoglobin: 10.4 g/dL — ABNORMAL LOW (ref 13.0–17.0)
MCH: 30 pg (ref 26.0–34.0)
MCHC: 32.9 g/dL (ref 30.0–36.0)
MCV: 91.1 fL (ref 78.0–100.0)
Platelets: 326 10*3/uL (ref 150–400)
RBC: 3.47 MIL/uL — ABNORMAL LOW (ref 4.22–5.81)
RDW: 13.1 % (ref 11.5–15.5)
WBC: 4.1 10*3/uL (ref 4.0–10.5)

## 2015-09-26 MED ORDER — AMOXICILLIN-POT CLAVULANATE 875-125 MG PO TABS
1.0000 | ORAL_TABLET | Freq: Two times a day (BID) | ORAL | Status: DC
  Administered 2015-09-27: 1 via ORAL
  Filled 2015-09-26: qty 1

## 2015-09-26 NOTE — Progress Notes (Signed)
Pharmacy Antibiotic Note  Darrell Graham is a 50 y.o. male admitted on 09/22/2015 with sigmoid diverticulitis with probable colovesical fistula. Pharmacy has been consulted for Zosyn dosing.  The patient's urine cultures were noted to grow E. Coli - pan-sensitive except R-Cipro/Bactrim. Discussed with both Dr. Janee Morn and Dr. Jomarie Longs and will plan to transition the patient from Zosyn to Augmentin on 2/24 AM.   Plan: 1. Continue Zosyn thru today 2. Start Augmentin 875 mg twice daily on 2/24 AM 3. Pharmacy will sign off consult - please re-consult [place] if needed.  Height:  (177.8 cm) Weight: 155 lb 13.8 oz (70.7 kg) IBW/kg (Calculated) : 73  Temp (24hrs), Avg:98.5 F (36.9 C), Min:97.7 F (36.5 C), Max:99.8 F (37.7 C)   Recent Labs Lab 09/22/15 1639 09/22/15 1655 09/22/15 2305 09/23/15 0152 09/24/15 0704 09/26/15 0520  WBC 19.0*  --   --  16.5* 5.9 4.1  CREATININE 0.99  --   --  0.79  --   --   LATICACIDVEN  --  2.78* 0.7 1.1  --   --     Estimated Creatinine Clearance: 114.2 mL/min (by C-G formula based on Cr of 0.79).    Allergies  Allergen Reactions  . Hemophilia Support [Acid Blockers Support] Other (See Comments)    Hemophilia Factor VIII  . Codeine Itching and Rash  . Gluten Meal Diarrhea, Itching and Rash    General lethargy  . Tetracyclines & Related Itching and Rash  . Wheat Bran Diarrhea, Itching and Rash    General lethargy    Antimicrobials this admission: Ceftriaxone 2g x1 2/19  Cipro 2/20>>2/20  Flagyl 2/20>>  Zosyn 2/21>>  Dose adjustments this admission: n/a  Microbiology results: 2/19 UCx >> pan-sensitive EXCEPT R-Bactrim/Cipro 2/19 BCx >> ngtd  Thank you for allowing pharmacy to be a part of this patient's care.  Georgina Pillion, PharmD, BCPS Clinical Pharmacist Pager: [phone] 09/26/2015 2:11 PM

## 2015-09-26 NOTE — Progress Notes (Addendum)
Patient is steping off the unit at this time.  I instructed the patient to be gone no longer than one hour.   5:13 PM patient came back from being outside at [phone]

## 2015-09-26 NOTE — Plan of Care (Signed)
Problem: Food- and Nutrition-Related Knowledge Deficit (NB-1.1) Goal: Nutrition education Formal process to instruct or train a patient/client in a skill or to impart knowledge to help patients/clients voluntarily manage or modify food choices and eating behavior to maintain or improve health. Outcome: Adequate for Discharge Nutrition Education Note  RD seen per request of patient services manager to discuss gluten free, low fiber diet.   Spoke with pt at bedside, who reveals that he is tolerating clear liquids well, but is anxious for diet advancement and to return home. He is very compliant with gluten free diet and was able to teach back celiac diet principles to this RD very effectively (he has had celiac disease for approximately 15 years per his report). He is very eager to learn about diet therapy to control his diverticulitis.   RD provided "Low Fiber Nutrition Therapy" handout from the [employer]. Reviewed patient's dietary recall. Provided examples on ways to decrease fiber intake in diet. Discouraged intake of whole grains and high fiber foods. Discouraged fresh fruits and vegetables as well as whole grain sources of carbohydrates to minimize fiber intake.   RD discussed why it is important for patient to adhere to diet recommendations. Teach back method used.  Expect good compliance.  Body mass index is 22.36 kg/(m^2). Pt meets criteria for normal weight range based on current BMI.  Current diet order is full liquid, patient is consuming approximately 50% of meals at this time. Labs and medications reviewed. RD will continue to follow.  Edker Punt A. Mayford Knife, RD, LDN, CDE Pager: [phone] After hours Pager: [phone]

## 2015-09-26 NOTE — Progress Notes (Signed)
  Subjective: He feels much better and really wants to get out of here.Minimal discomfort right now.    Objective: Vital signs in last 24 hours: Temp:  [97.7 F (36.5 C)-99.8 F (37.7 C)] 97.7 F (36.5 C) (02/23 0951) Pulse Rate:  [81-103] 99 (02/23 0951) Resp:  [16-18] 18 (02/23 0949) BP: (114-128)/(75-88) 127/88 mmHg (02/23 0949) SpO2:  [97 %-100 %] 98 % (02/23 0951) Last BM Date: 09/24/15 I/O ? Afebrile, VSS WBC is 4.1 Intake/Output from previous day: 02/22 0701 - 02/23 0700 In: 625 [I.V.:525; IV Piggyback:100] Out: 200 [Urine:200] Intake/Output this shift: Total I/O In: -  Out: 300 [Urine:300]  General appearance: alert, cooperative and no distress GI: soft, non-tender; bowel sounds normal; no masses,  no organomegaly  Lab Results:   Recent Labs  09/24/15 0704 09/26/15 0520  WBC 5.9 4.1  HGB 10.5* 10.4*  HCT 32.4* 31.6*  PLT 326 326    BMET No results for input(s): NA, K, CL, CO2, GLUCOSE, BUN, CREATININE, CALCIUM in the last 72 hours. PT/INR No results for input(s): LABPROT, INR in the last 72 hours.   Recent Labs Lab 09/23/15 0152  AST 16  ALT 11*  ALKPHOS 53  BILITOT 0.6  PROT 6.7  ALBUMIN 3.1*     Lipase  No results found for: LIPASE   Studies/Results: No results found.  Medications: . piperacillin-tazobactam (ZOSYN)  IV  3.375 g Intravenous Q8H  . pneumococcal 23 valent vaccine  0.5 mL Intramuscular Tomorrow-1000  . venlafaxine XR  37.5 mg Oral Q breakfast    Assessment/Plan Sigmoid diverticulitis with colovesical fistula  UTI Hemophilia Factor 8 Deficiency Hepatitis C Celiac disease OCD Antibiotics: 2 days of Cipro/Flagyl, started on Zosyn 09/24/15 day 3 Zosyn DVT: SCD only    Plan:  Advance his diet and see how he does.  He would like to go home today.  I told him that would be up to Dr. Janee Morn and Jomarie Longs.  I will put him on fulls now and soft diet for AM.  Saline lock IV to free him up more.  Saline lock for Zosyn.     LOS: 4 days    Marquelle Balow 09/26/2015

## 2015-09-26 NOTE — Progress Notes (Signed)
[facility] PROGRESS NOTE  JAMARCO ZALDIVAR ZOX:[id number] DOB: [birth year] DOA: 09/22/2015 PCP: Farris Has, MD  Assessment/Plan: Sepsis  -due to Sigmoid diverticulitis and colo-vesical fistula -improving -s/p CIpro/FLagyl, now on Day 3 of IV Zosyn, no fevers or leukocytosis at this point -[facility] and Urology consulting -Blood cultures negative, urine cultures with Escherichia coli resistant to quinolones -Advance diet, change Abx to PO if ok with [facility] -Plan for interval colonoscopy followed by single-stage colectomy as outpatient per [facility], will need Factor 8 pre-op and post op  Colovesicular fistula - CT scan showed wall thickening of urinary bladder with air present as well as wall thickening of the sigmoid colon with multiple colonic diverticula. Loss of fat plane between sigmoid colon and urinary bladder suspicious for colovesicular fistula.  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 prior to surgery and post op, followed by Hematology at [facility], also sees Dr.Sherril and will need his involvement periop, and coordination with Pharmacy to have stock of Factor 8  Code Status: Full code Family Communication:none at bedside Disposition Plan: Home when cleared by [facility]   Consultants:  Urology  General Surgery  Procedures:  None  Antibiotics:  Zosyn  HPI/Subjective: Feels better, wants to go home, denies pain, tolerating clears   Objective: Filed Vitals:   09/26/15 0949 09/26/15 0951  BP: 127/88   Pulse: 98 99  Temp: 99.1 F (37.3 C) 97.7 F (36.5 C)  Resp: 18     Intake/Output Summary (Last 24 hours) at 09/26/15 1138 Last data filed at 09/26/15 0716  Gross per 24 hour  Intake    575 ml  Output    500 ml  Net     75 ml   Filed Weights   09/22/15 1617 09/22/15 1824 09/23/15 0611  Weight: 74.844 kg (165 lb) 70.903 kg (156 lb 5 oz) 70.7 kg (155 lb 13.8 oz)     Exam:   General:  Alert and oriented, in no acute distress, resting comfortably  Cardiovascular: Tachycardia  Respiratory: CTAB  Abdomen: Soft, non-distended, non tender, BS present  Musculoskeletal: No obvious swelling or deformities   Data Reviewed: Basic Metabolic Panel:  Recent Labs Lab 09/22/15 1639 09/23/15 0152  NA 135 141  K 4.0 3.6  CL 100* 108  CO2 22 25  GLUCOSE 155* 121*  BUN 12 7  CREATININE 0.99 0.79  CALCIUM 9.5 8.6*   Liver Function Tests:  Recent Labs Lab 09/23/15 0152  AST 16  ALT 11*  ALKPHOS 53  BILITOT 0.6  PROT 6.7  ALBUMIN 3.1*   No results for input(s): LIPASE, AMYLASE in the last 168 hours. No results for input(s): AMMONIA in the last 168 hours. CBC:  Recent Labs Lab 09/22/15 1639 09/23/15 0152 09/24/15 0704 09/26/15 0520  WBC 19.0* 16.5* 5.9 4.1  NEUTROABS 16.0*  --   --   --   HGB 12.2* 10.1* 10.5* 10.4*  HCT 37.5* 31.3* 32.4* 31.6*  MCV 90.8 90.5 90.8 91.1  PLT 447* 356 326 326   Cardiac Enzymes: No results for input(s): CKTOTAL, CKMB, CKMBINDEX, TROPONINI in the last 168 hours. BNP (last 3 results) No results for input(s): BNP in the last 8760 hours.  ProBNP (last 3 results) No results for input(s): PROBNP in the last 8760 hours.  CBG:  Recent Labs Lab 09/23/15 1143  GLUCAP 111*    Recent Results (from the past 240 hour(s))  Blood Culture (  routine x 2)     Status: None (Preliminary result)   Collection Time: 09/22/15  6:45 PM  Result Value Ref Range Status   Specimen Description BLOOD LEFT ANTECUBITAL  Final   Special Requests BOTTLES DRAWN AEROBIC AND ANAEROBIC  Final   Culture NO GROWTH 3 DAYS  Final   Report Status PENDING  Incomplete  Blood Culture (routine x 2)     Status: None (Preliminary result)   Collection Time: 09/22/15  6:45 PM  Result Value Ref Range Status   Specimen Description BLOOD LEFT ANTECUBITAL  Final   Special Requests BOTTLES DRAWN AEROBIC AND ANAEROBIC  Final    Culture NO GROWTH 3 DAYS  Final   Report Status PENDING  Incomplete  Urine culture     Status: None   Collection Time: 09/22/15  7:15 PM  Result Value Ref Range Status   Specimen Description URINE, RANDOM  Final   Special Requests NONE  Final   Culture 80,000 COLONIES/ml ESCHERICHIA COLI  Final   Report Status 09/25/2015 FINAL  Final   Organism ID, Bacteria ESCHERICHIA COLI  Final      Susceptibility   Escherichia coli - MIC*    AMPICILLIN 4 SENSITIVE Sensitive     CEFAZOLIN <=4 SENSITIVE Sensitive     CEFTRIAXONE <=1 SENSITIVE Sensitive     CIPROFLOXACIN >=4 RESISTANT Resistant     GENTAMICIN 2 SENSITIVE Sensitive     IMIPENEM <=0.25 SENSITIVE Sensitive     NITROFURANTOIN <=16 SENSITIVE Sensitive     TRIMETH/SULFA >=320 RESISTANT Resistant     AMPICILLIN/SULBACTAM <=2 SENSITIVE Sensitive     PIP/TAZO <=4 SENSITIVE Sensitive     * 80,000 COLONIES/ml ESCHERICHIA COLI     Studies: No results found.  Scheduled Meds: . piperacillin-tazobactam (ZOSYN)  IV  3.375 g Intravenous Q8H  . pneumococcal 23 valent vaccine  0.5 mL Intramuscular Tomorrow-1000  . venlafaxine XR  37.5 mg Oral Q breakfast   Continuous Infusions:    Principal Problem:   Sepsis (HCC) Active Problems:   Hemophilia (HCC)   Headache   OCD (obsessive compulsive disorder)   UTI (lower urinary tract infection)   Diverticulitis of sigmoid colon   Colo-vesical fistula      Zannie Cove, MD [facility] Pager [phone]. If 7PM-7AM, please contact night-coverage at [email or web address], password [facility] 09/26/2015, 11:38 AM  LOS: 4 days  , 11:38 AM

## 2015-09-27 LAB — CULTURE, BLOOD (ROUTINE X 2)
Culture: NO GROWTH
Culture: NO GROWTH

## 2015-09-27 MED ORDER — POLYETHYLENE GLYCOL 3350 17 G PO PACK: 17.0000 g | PACK | Freq: Every day | ORAL | Status: DC | PRN

## 2015-09-27 MED ORDER — HYDROCODONE-ACETAMINOPHEN 5-325 MG PO TABS: 1.0000 | ORAL_TABLET | Freq: Four times a day (QID) | ORAL | Status: AC | PRN

## 2015-09-27 MED ORDER — SACCHAROMYCES BOULARDII 250 MG PO CAPS: 250.0000 mg | ORAL_CAPSULE | Freq: Two times a day (BID) | ORAL | Status: DC

## 2015-09-27 MED ORDER — AMOXICILLIN-POT CLAVULANATE 875-125 MG PO TABS: 1.0000 | ORAL_TABLET | Freq: Two times a day (BID) | ORAL | Status: DC

## 2015-09-27 NOTE — Progress Notes (Signed)
  Subjective: He has minimal pain over this bladder, tolerating diet, and happy to be up to a soft diet.  Happy with getting outside yesterday.  Has a headache this AM.  Objective: Vital signs in last 24 hours: Temp:  [98 F (36.7 C)-98.6 F (37 C)] 98 F (36.7 C) (02/24 0702) Pulse Rate:  [87-97] 88 (02/24 0702) Resp:  [18] 18 (02/24 0702) BP: (114-133)/(75-84) 114/75 mmHg (02/24 0702) SpO2:  [98 %-99 %] 99 % (02/24 0702) Last BM Date: 09/26/15 Soft diet 222 recorded PO Urine 300 recorded Afebrile, VSS No labs today Intake/Output from previous day: 02/23 0701 - 02/24 0700 In: 322 [P.O.:222; IV Piggyback:100] Out: 300 [Urine:300] Intake/Output this shift:    General appearance: alert, cooperative and no distress GI: soft, still having some minimal discomfort around the bladder.  No pain on palpaition.  Tolerating diet.  Lab Results:   Recent Labs  09/26/15 0520  WBC 4.1  HGB 10.4*  HCT 31.6*  PLT 326    BMET No results for input(s): NA, K, CL, CO2, GLUCOSE, BUN, CREATININE, CALCIUM in the last 72 hours. PT/INR No results for input(s): LABPROT, INR in the last 72 hours.   Recent Labs Lab 09/23/15 0152  AST 16  ALT 11*  ALKPHOS 53  BILITOT 0.6  PROT 6.7  ALBUMIN 3.1*     Lipase  No results found for: LIPASE   Studies/Results: No results found.  Medications: . amoxicillin-clavulanate  1 tablet Oral BID WC  . pneumococcal 23 valent vaccine  0.5 mL Intramuscular Tomorrow-1000  . venlafaxine XR  37.5 mg Oral Q breakfast    Assessment/Plan Sigmoid diverticulitis with colovesical fistula  UTI - Ecoli  Hemophilia Factor 8 Deficiency Hepatitis C Celiac disease OCD Antibiotics: 2 days of Cipro/Flagyl, started on Zosyn 09/24/15 day 4 Zosyn DVT: SCD only   Plan:  From our standpoint he can switch to Augmentin, and go home.  I will add a probiotic and I would keep him on this as long as we have him on antibiotics.  After antibiotics he needs  Colonoscopy and then be seen with plans to fix EC fistula and partial colectomy.  Follow up with Dr. Maisie Fus is in AVS.      LOS: 5 days    Niko Penson 09/27/2015

## 2015-09-27 NOTE — Progress Notes (Signed)
CSW consulted regarding transportation needs at discharge. Patient reported having no one to pick him up and would have to walk too far on the bus.  CSW provided taxi voucher.  Osborne Casco Edi Gorniak LCSWA [phone]

## 2015-09-27 NOTE — Progress Notes (Signed)
Discharge instructions given to the patient.  Patient verbalized understanding of the instructions.  PIV times 2 was removed.  Site clean, dry, and intact.   Discharge vitals are: Filed Vitals:   09/27/15 0206 09/27/15 0702  BP: 120/77 114/75  Pulse: 87 88  Temp: 98.5 F (36.9 C) 98 F (36.7 C)  Resp: 18 18   Discharge medications are:   Medication List    STOP taking these medications        valsartan 160 MG tablet  Commonly known as:  DIOVAN      TAKE these medications        acetaminophen 325 MG tablet  Commonly known as:  TYLENOL  Take 650 mg by mouth daily as needed for mild pain, fever or headache.     ACZONE EX  Apply 1 application topically daily as needed (for acne).     ALPRAZolam 0.25 MG tablet  Commonly known as:  XANAX  Take 0.25 mg by mouth at bedtime as needed for anxiety.     amLODipine 5 MG tablet  Commonly known as:  NORVASC  Take 5 mg by mouth every evening.     amoxicillin-clavulanate 875-125 MG tablet  Commonly known as:  AUGMENTIN  Take 1 tablet by mouth 2 (two) times daily with a meal. For 39QZES     Antihemophilic Factor rAHF-PAF 9233 units Kit  Commonly known as:  XYNTHA SOLOFUSE  Inject 3,000 Units into the vein as needed.     cholecalciferol 1000 units tablet  Commonly known as:  VITAMIN D  Take 1,000 Units by mouth every evening.     HYDROcodone-acetaminophen 5-325 MG tablet  Commonly known as:  NORCO/VICODIN  Take 1-2 tablets by mouth every 6 (six) hours as needed for moderate pain or severe pain.     MULTIVITAMIN PO  Take 1 tablet by mouth daily.     OPCON-A 0.027-0.315 % Soln  Generic drug:  Naphazoline-Pheniramine  Apply 1 drop to eye daily as needed (for dry eyes).     polyethylene glycol packet  Commonly known as:  MIRALAX / GLYCOLAX  Take 17 g by mouth daily as needed for mild constipation. For constipation from narcotics     saccharomyces boulardii 250 MG capsule  Commonly known as:  FLORASTOR  Take 1 capsule (250  mg total) by mouth 2 (two) times daily. For 10days     venlafaxine XR 37.5 MG 24 hr capsule  Commonly known as:  EFFEXOR XR  Take 1 capsule (37.5 mg total) by mouth daily with breakfast.       Patient waiting for discharge ride.   Forrestine Him, RN 09/27/15 at [phone]

## 2015-09-27 NOTE — Care Management Note (Signed)
Case Management Note  Patient Details  Name: Darrell Graham MRN: [id number] Date of Birth: [birth year]  Subjective/Objective:                    Action/Plan: Plan is to d/c to home today . No further needs identified per CM.  Expected Discharge Date:       09/27/15           Expected Discharge Plan:  Home/Self Care  In-House Referral:     Discharge planning Services  CM Consult  Post Acute Care Choice:    Choice offered to:     DME Arranged:    DME Agency:     HH Arranged:    HH Agency:     Status of Service:  Completed, signed off  Medicare Important Message Given:    Date Medicare IM Given:    Medicare IM give by:    Date Additional Medicare IM Given:    Additional Medicare Important Message give by:     If discussed at Long Length of Stay Meetings, dates discussed:    Additional Comments:  Epifanio Lesches, [place] [phone] 09/27/2015, 12:13 PM

## 2015-09-27 NOTE — Discharge Summary (Signed)
Physician Discharge Summary  Darrell Graham WKG:[id number] DOB: [birth year] DOA: 09/22/2015  PCP: London Pepper, MD  Admit date: 09/22/2015 Discharge date: 09/27/2015  Time spent: 45 minutes  Recommendations for Outpatient Follow-up:  1. Dr. Leighton Ruff on 4/58 to discuss surgical options for Colo-vesical fistula, Surgical planning would need to involve Dr. Leodis Rains (hematology), due to history of hemophilia 2. Dr.Edwards in 1 month for Colonoscopy   Discharge Diagnoses:  Principal Problem:   Sepsis ([place])   Diverticulitis of sigmoid colon   Colo-vesical fistula   Hemophilia ([place])   Headache   OCD (obsessive compulsive disorder)   UTI (lower urinary tract infection)    Discharge Condition: Stable  Diet recommendation: Soft bland diet  Filed Weights   09/22/15 1617 09/22/15 1824 09/23/15 0611  Weight: 74.844 kg (165 lb) 70.903 kg (156 lb 5 oz) 70.7 kg (155 lb 13.8 oz)    History of present illness:  Chief Complaint: Hematuria, fever  HPI: Darrell Graham is a 50 y.o. male with a past medical history significant for hemophilia A, hep C undetec viral load, and celiac disease presented with hematuria and fever. He had been having some dysuria over several weeks this this progressed, Friday he had gross hematuria, maybe some pneumaturia, and then for 2days he had fever upto 102F .In the ED, he was febrile 101F, tachycardic to 130s, and with normal BP. Cr 1.0, BUN 12, WBC 19K, Hgb 12, lactate 2.2 and urinalysis with pyuria and hematuria  Hospital Course:  1. Sepsis  -due to Sigmoid diverticulitis and colo-vesical fistula seen on CT scan -improved, treated with IVF, Zosyn, Bowel rest,  -CCS and Urology consulted -Blood cultures negative, urine cultures with Escherichia coli resistant to quinolones -Slowly advance diet, changed Abx to PO Augmentin at discharge -Plan for interval colonoscopy followed by single-stage colectomy as outpatient per CCS, will need Factor 8  pre-op and post op  2. Colovesicular fistula - CT scan showed wall thickening of urinary bladder with air present as well as wall thickening of the sigmoid colon with multiple colonic diverticula. Loss of fat plane between sigmoid colon and urinary bladder suspicious for colovesicular fistula.  -As above  3. Urinary tract infection - due to the above, Abx as above  4. Hemophilia - Factor VIII deficiency, will need supplementation prior to surgery and post op, followed by Hematology at [facility], also sees Dr.Sherril and will need his involvement periop, and coordination with Pharmacy to have stock of Factor 8   Consultations:  CCS  Discharge Exam: Filed Vitals:   09/27/15 0702 09/27/15 1448  BP: 114/75 124/93  Pulse: 88 112  Temp: 98 F (36.7 C) 98.6 F (37 C)  Resp: 18 20    General: AAOx3 Cardiovascular: S1S2/RRR Respiratory: CTAB  Discharge Instructions   Discharge Instructions    Diet - low sodium heart healthy    Complete by:  As directed      Increase activity slowly    Complete by:  As directed           Discharge Medication List as of 09/27/2015 12:40 PM    START taking these medications   Details  amoxicillin-clavulanate (AUGMENTIN) 875-125 MG tablet Take 1 tablet by mouth 2 (two) times daily with a meal. For 10days, Starting 09/27/2015, Until Discontinued, Print    HYDROcodone-acetaminophen (NORCO/VICODIN) 5-325 MG tablet Take 1-2 tablets by mouth every 6 (six) hours as needed for moderate pain or severe pain., Starting 09/27/2015, Until Discontinued, Print    polyethylene  glycol (MIRALAX / GLYCOLAX) packet Take 17 g by mouth daily as needed for mild constipation. For constipation from narcotics, Starting 09/27/2015, Until Discontinued, Print      CONTINUE these medications which have NOT CHANGED   Details  acetaminophen (TYLENOL) 325 MG tablet Take 650 mg by mouth daily as needed for mild pain, fever or headache., Until Discontinued, Historical Med     ALPRAZolam (XANAX) 0.25 MG tablet Take 0.25 mg by mouth at bedtime as needed for anxiety., Until Discontinued, Historical Med    amLODipine (NORVASC) 5 MG tablet Take 5 mg by mouth every evening. , Until Discontinued, Historical Med    Antihemophilic Factor rAHF-PAF (XYNTHA SOLOFUSE) 3000 UNITS KIT Inject 3,000 Units into the vein as needed., Starting 07/22/2015, Until Discontinued, Normal    cholecalciferol (VITAMIN D) 1000 UNITS tablet Take 1,000 Units by mouth every evening. , Until Discontinued, Historical Med    Dapsone (ACZONE EX) Apply 1 application topically daily as needed (for acne). , Until Discontinued, Historical Med    Multiple Vitamins-Minerals (MULTIVITAMIN PO) Take 1 tablet by mouth daily., Until Discontinued, Historical Med    Naphazoline-Pheniramine (OPCON-A) 0.027-0.315 % SOLN Apply 1 drop to eye daily as needed (for dry eyes)., Until Discontinued, Historical Med    venlafaxine XR (EFFEXOR XR) 37.5 MG 24 hr capsule Take 1 capsule (37.5 mg total) by mouth daily with breakfast., Starting 07/02/2015, Until Discontinued, Normal      STOP taking these medications     valsartan (DIOVAN) 160 MG tablet        Allergies  Allergen Reactions  . Hemophilia Support [Acid Blockers Support] Other (See Comments)    Hemophilia Factor VIII  . Codeine Itching and Rash  . Gluten Meal Diarrhea, Itching and Rash    General lethargy  . Tetracyclines & Related Itching and Rash  . Wheat Bran Diarrhea, Itching and Rash    General lethargy   Follow-up Information    Follow up with Rosario Adie., MD On 1/88/4166.   Specialty:  General Surgery   Why:  10AM to discuss colovesicle fistula   Contact information:   [address] [place] [address] [phone]       Follow up with EDWARDS JR,Donley L, MD. Schedule an appointment as soon as possible for a visit in 2 weeks.   Specialty:  Gastroenterology   Why:  for Colonsocopy prior to Surgery   Contact information:    [address] [place] [address] [phone]        The results of significant diagnostics from this hospitalization (including imaging, microbiology, ancillary and laboratory) are listed below for reference.    Significant Diagnostic Studies: Ct Renal Stone Study  09/22/2015  CLINICAL DATA:  Hematuria. Urinary tract infection. Evaluate for urolithiasis. Urinary pain for 1 month. EXAM: CT ABDOMEN AND PELVIS WITHOUT CONTRAST TECHNIQUE: Multidetector CT imaging of the abdomen and pelvis was performed following the standard protocol without IV contrast. COMPARISON:  CT 12/01/2005 FINDINGS: Lower chest: The included lung bases are clear. No pleural effusion. Liver: No focal lesion allowing for lack contrast. Hepatobiliary: Gallbladder physiologically distended, no calcified stone. No biliary dilatation. Pancreas: No ductal dilatation or inflammation. Spleen: Normal. Adrenal glands: No nodule. Kidneys: Mild bilateral hydronephrosis. Both ureters are dilated to the bladder insertion, right greater than left. No urolithiasis. Focal cortical scarring in the mid upper right kidney. Stomach/Bowel: Stomach physiologically distended. There are no dilated or thickened small bowel loops. Wall thickening and stranding about  the sigmoid colon with multiple colonic diverticula. This is in the region of prior acute diverticulitis on remote prior exam. Fat plane between the sigmoid colon and left urinary bladder dome is obscured, there is associated bladder wall thickening. No free air or extraluminal abscess. The appendix is normal. Vascular/Lymphatic: No retroperitoneal adenopathy. Abdominal aorta is normal in caliber. Reproductive: Prostate gland appears prominent in age measuring 4.9 cm transverse dimension. Seminal vesicles are prominent. Bladder: Mildly distended with wall thickening involving the posterior left aspect. Wall thickening measures up to 1.5 cm and is adjacent to sigmoid colonic  inflammation. There is air within the urinary bladder. Perivesicular stranding about the bladder dome. Other: Pelvic soft tissue stranding primarily about the sigmoid colon, no discrete abscess or free fluid. No free air. Musculoskeletal: There are no acute or suspicious osseous abnormalities. IMPRESSION: 1. Left lower quadrant inflammation with sigmoid colonic wall thickening and fat stranding in the region of multiple diverticula. There is loss of normal fat plane between the sigmoid colon and urinary bladder with associated bladder wall thickening as well as air within the bladder lumen. Colovesicular fistula is considered, either secondary to diverticulitis or colonic malignancy. Alternatively, bladder wall thickening could be secondary to infection or bladder neoplasm and air introduced iatrogenically. Colonoscopy and possibly cystoscopy recommended. 2. Mild bilateral hydroureteronephrosis without urolithiasis. 3. Cortical scarring in the right kidney, unchanged. Electronically Signed   By: Jeb Levering M.D.   On: 09/22/2015 20:58    Microbiology: Recent Results (from the past 240 hour(s))  Blood Culture (routine x 2)     Status: None   Collection Time: 09/22/15  6:45 PM  Result Value Ref Range Status   Specimen Description BLOOD LEFT ANTECUBITAL  Final   Special Requests BOTTLES DRAWN AEROBIC AND ANAEROBIC 5ML  Final   Culture NO GROWTH 5 DAYS  Final   Report Status 09/27/2015 FINAL  Final  Blood Culture (routine x 2)     Status: None   Collection Time: 09/22/15  6:45 PM  Result Value Ref Range Status   Specimen Description BLOOD LEFT ANTECUBITAL  Final   Special Requests BOTTLES DRAWN AEROBIC AND ANAEROBIC 5ML  Final   Culture NO GROWTH 5 DAYS  Final   Report Status 09/27/2015 FINAL  Final  Urine culture     Status: None   Collection Time: 09/22/15  7:15 PM  Result Value Ref Range Status   Specimen Description URINE, RANDOM  Final   Special Requests NONE  Final   Culture 80,000  COLONIES/ml ESCHERICHIA COLI  Final   Report Status 09/25/2015 FINAL  Final   Organism ID, Bacteria ESCHERICHIA COLI  Final      Susceptibility   Escherichia coli - MIC*    AMPICILLIN 4 SENSITIVE Sensitive     CEFAZOLIN <=4 SENSITIVE Sensitive     CEFTRIAXONE <=1 SENSITIVE Sensitive     CIPROFLOXACIN >=4 RESISTANT Resistant     GENTAMICIN 2 SENSITIVE Sensitive     IMIPENEM <=0.25 SENSITIVE Sensitive     NITROFURANTOIN <=16 SENSITIVE Sensitive     TRIMETH/SULFA >=320 RESISTANT Resistant     AMPICILLIN/SULBACTAM <=2 SENSITIVE Sensitive     PIP/TAZO <=4 SENSITIVE Sensitive     * 80,000 COLONIES/ml ESCHERICHIA COLI     Labs: Basic Metabolic Panel:  Recent Labs Lab 09/22/15 1639 09/23/15 0152  NA 135 141  K 4.0 3.6  CL 100* 108  CO2 22 25  GLUCOSE 155* 121*  BUN 12 7  CREATININE 0.99 0.79  CALCIUM 9.5  8.6*   Liver Function Tests:  Recent Labs Lab 09/23/15 0152  AST 16  ALT 11*  ALKPHOS 53  BILITOT 0.6  PROT 6.7  ALBUMIN 3.1*   No results for input(s): LIPASE, AMYLASE in the last 168 hours. No results for input(s): AMMONIA in the last 168 hours. CBC:  Recent Labs Lab 09/22/15 1639 09/23/15 0152 09/24/15 0704 09/26/15 0520  WBC 19.0* 16.5* 5.9 4.1  NEUTROABS 16.0*  --   --   --   HGB 12.2* 10.1* 10.5* 10.4*  HCT 37.5* 31.3* 32.4* 31.6*  MCV 90.8 90.5 90.8 91.1  PLT 447* 356 326 326   Cardiac Enzymes: No results for input(s): CKTOTAL, CKMB, CKMBINDEX, TROPONINI in the last 168 hours. BNP: BNP (last 3 results) No results for input(s): BNP in the last 8760 hours.  ProBNP (last 3 results) No results for input(s): PROBNP in the last 8760 hours.  CBG:  Recent Labs Lab 09/23/15 1143  GLUCAP 111*       SignedDomenic Polite MD.  [facility] 09/27/2015, 6:25 PM

## 2015-10-04 ENCOUNTER — Other Ambulatory Visit: Payer: Self-pay | Admitting: Physician Assistant

## 2015-10-04 DIAGNOSIS — N321 Vesicointestinal fistula: Secondary | ICD-10-CM

## 2015-10-04 DIAGNOSIS — K5793 Diverticulitis of intestine, part unspecified, without perforation or abscess with bleeding: Secondary | ICD-10-CM

## 2015-10-07 ENCOUNTER — Other Ambulatory Visit: Payer: Self-pay | Admitting: Physician Assistant

## 2015-10-07 ENCOUNTER — Ambulatory Visit
Admission: RE | Admit: 2015-10-07 | Discharge: 2015-10-07 | Disposition: A | Discharge status: Home / Self Care | Payer: BLUE CROSS/BLUE SHIELD | Source: Ambulatory Visit | Attending: Physician Assistant | Admitting: Physician Assistant

## 2015-10-07 DIAGNOSIS — N321 Vesicointestinal fistula: Secondary | ICD-10-CM

## 2015-10-07 DIAGNOSIS — K5793 Diverticulitis of intestine, part unspecified, without perforation or abscess with bleeding: Secondary | ICD-10-CM

## 2015-12-02 DIAGNOSIS — N2889 Other specified disorders of kidney and ureter: Secondary | ICD-10-CM | POA: Diagnosis not present

## 2015-12-03 DIAGNOSIS — L988 Other specified disorders of the skin and subcutaneous tissue: Secondary | ICD-10-CM | POA: Diagnosis not present

## 2015-12-11 DIAGNOSIS — D509 Iron deficiency anemia, unspecified: Secondary | ICD-10-CM | POA: Diagnosis not present

## 2015-12-11 DIAGNOSIS — L988 Other specified disorders of the skin and subcutaneous tissue: Secondary | ICD-10-CM | POA: Diagnosis not present

## 2015-12-11 DIAGNOSIS — K900 Celiac disease: Secondary | ICD-10-CM | POA: Diagnosis not present

## 2015-12-11 DIAGNOSIS — D66 Hereditary factor VIII deficiency: Secondary | ICD-10-CM | POA: Diagnosis not present

## 2015-12-12 ENCOUNTER — Encounter: Payer: Self-pay | Admitting: Oncology

## 2015-12-12 NOTE — Progress Notes (Signed)
Fax sent 04/25/15 I sent to medical records

## 2015-12-25 ENCOUNTER — Encounter: Payer: Self-pay | Admitting: Neurology

## 2015-12-25 ENCOUNTER — Ambulatory Visit (INDEPENDENT_AMBULATORY_CARE_PROVIDER_SITE_OTHER): Payer: BLUE CROSS/BLUE SHIELD | Admitting: Neurology

## 2015-12-25 VITALS — BP 112/70 | HR 78 | Ht 70.0 in | Wt 162.5 lb

## 2015-12-25 DIAGNOSIS — F429 Obsessive-compulsive disorder, unspecified: Secondary | ICD-10-CM

## 2015-12-25 DIAGNOSIS — G44221 Chronic tension-type headache, intractable: Secondary | ICD-10-CM

## 2015-12-25 MED ORDER — VENLAFAXINE HCL ER 37.5 MG PO CP24: 37.5000 mg | ORAL_CAPSULE | Freq: Every day | ORAL | Status: DC

## 2015-12-25 NOTE — Progress Notes (Signed)
Reason for visit: OCD  Darrell Graham is an 50 y.o. male  History of present illness:  Darrell Graham is a 50 year old right-handed white male with a history of OCD behavior. The patient indicates that he feels as if the left arm has too much energy, he has to be doing something at all times such as scratching his head. The patient can suppress the behavior for a period of time, but ultimately he cannot continue to do this. The patient recently has had some medical issues with a fistula between the bladder and colon. The patient will be having surgical correction of this sometime in July 2017. The patient is also taking care of his mother who has a progressive dementing illness. For these reasons, he has been under increased stress which has worsened his OCD behavior. The patient returns to this office for an evaluation. He was placed on Prozac, but he could not tolerate the medication. A prescription for Effexor was called in, but it is not clear that he has ever been on the drug.  Past Medical History  Diagnosis Date  . Hemophilia A ([place])   . Hepatitis C virus     recent viral titer negative  . Glaucoma   . Deviated nasal septum   . Allergy   . Asthma   . Scoliosis   . Celiac disease   . Chronic arthropathy     left ankle & right elbow  . H/O vitamin D deficiency   . Telangiectasias     upper back and chest  . High blood pressure   . Diverticulitis   . Hernia   . OCD (obsessive compulsive disorder) 07/01/2015  . UTI (urinary tract infection) 09/2015    Past Surgical History  Procedure Laterality Date  . Ankle arthroscopy  1996  . Scrotal surgery      Family History  Problem Relation Age of Onset  . Hemophilia    . Hepatitis C    . Osteopenia    . Psoriasis    . GER disease    . Dementia Mother   . Migraines Father   . Stroke Father   . Heart disease Father     Social history:  reports that he has never smoked. He has never used smokeless tobacco. He reports  that he does not drink alcohol or use illicit drugs.    Allergies  Allergen Reactions  . Aspirin     Other reaction(s): Bleeding (intolerance)  . Hemophilia Support [Acid Blockers Support] Other (See Comments)    Hemophilia Factor VIII  . Codeine Itching and Rash  . Gluten Meal Diarrhea, Itching and Rash    General lethargy  . Tetracyclines & Related Itching and Rash  . Wheat Bran Diarrhea, Itching and Rash    General lethargy    Medications:  Prior to Admission medications   Medication Sig Start Date End Date Taking? Authorizing Provider  acetaminophen (TYLENOL) 325 MG tablet Take 650 mg by mouth daily as needed for mild pain, fever or headache.    Historical Provider, MD  ALPRAZolam Duanne Moron) 0.25 MG tablet Take 0.25 mg by mouth at bedtime as needed for anxiety.    Historical Provider, MD  amLODipine (NORVASC) 5 MG tablet Take 5 mg by mouth every evening.     Historical Provider, MD  amoxicillin-clavulanate (AUGMENTIN) 875-125 MG tablet Take 1 tablet by mouth 2 (two) times daily with a meal. For 10days 09/27/15   Domenic Polite, MD  Antihemophilic Factor rAHF-PAF (  XYNTHA SOLOFUSE) 3000 UNITS KIT Inject 3,000 Units into the vein as needed. Patient taking differently: Inject 3,000 Units into the vein as needed (for bleeding).  07/22/15   Ladell Pier, MD  cholecalciferol (VITAMIN D) 1000 UNITS tablet Take 1,000 Units by mouth every evening.     Historical Provider, MD  Dapsone (ACZONE EX) Apply 1 application topically daily as needed (for acne).     Historical Provider, MD  HYDROcodone-acetaminophen (NORCO/VICODIN) 5-325 MG tablet Take 1-2 tablets by mouth every 6 (six) hours as needed for moderate pain or severe pain. 09/27/15   Domenic Polite, MD  Multiple Vitamins-Minerals (MULTIVITAMIN PO) Take 1 tablet by mouth daily.    Historical Provider, MD  Naphazoline-Pheniramine (OPCON-A) 0.027-0.315 % SOLN Apply 1 drop to eye daily as needed (for dry eyes).    Historical Provider, MD    polyethylene glycol (MIRALAX / GLYCOLAX) packet Take 17 g by mouth daily as needed for mild constipation. For constipation from narcotics 09/27/15   Domenic Polite, MD  saccharomyces boulardii (FLORASTOR) 250 MG capsule Take 1 capsule (250 mg total) by mouth 2 (two) times daily. For 10days 09/27/15   Domenic Polite, MD  venlafaxine XR (EFFEXOR XR) 37.5 MG 24 hr capsule Take 1 capsule (37.5 mg total) by mouth daily with breakfast. 07/02/15   Kathrynn Ducking, MD    ROS:  Out of a complete 14 system review of symptoms, the patient complains only of the following symptoms, and all other reviewed systems are negative.  Fatigue, unexpected weight change Ringing in the ears Light sensitivity Excessive thirst Swollen abdomen, abdominal pain, rectal bleeding, diarrhea Restless legs Food allergies Joint pain, joint swelling, aching muscles, neck pain Itching Bruising easily Memory loss, headache Agitation, decreased concentration, depression, anxiety  Blood pressure 112/70, pulse 78, height 5' 10" (1.778 m), weight 162 lb 8 oz (73.71 kg).  Physical Exam  General: The patient is alert and cooperative at the time of the examination.  Skin: No significant peripheral edema is noted.   Neurologic Exam  Mental status: The patient is alert and oriented x 3 at the time of the examination. The patient has apparent normal recent and remote memory, with an apparently normal attention span and concentration ability.   Cranial nerves: Facial symmetry is present. Speech is normal, no aphasia or dysarthria is noted. Extraocular movements are full. Visual fields are full.  Motor: The patient has good strength in all 4 extremities. Some atrophy of the left gastrocnemius muscle is seen relative to the right.  Sensory examination: Soft touch sensation is symmetric on the face, arms, and legs.  Coordination: The patient has good finger-nose-finger and heel-to-shin bilaterally.  Gait and station: The  patient has a normal gait. Tandem gait is normal. Romberg is negative. No drift is seen.  Reflexes: Deep tendon reflexes are symmetric.   Assessment/Plan:  1. OCD  2. Anxiety disorder  The patient will be placed on Effexor in low dose, we will seek out psychological services to see if cognitive therapy may also be of some benefit. The patient will follow-up in 6 months, sooner if needed. The patient will call me if he is tolerating the Effexor, but requires a higher dose.  Jill Alexanders MD 12/25/2015 7:50 PM  [facility] [address] [place], [place] [address]  Phone [phone] Fax [phone]

## 2015-12-26 ENCOUNTER — Other Ambulatory Visit: Payer: Self-pay | Admitting: Neurology

## 2015-12-26 DIAGNOSIS — F429 Obsessive-compulsive disorder, unspecified: Secondary | ICD-10-CM

## 2016-01-06 DIAGNOSIS — K589 Irritable bowel syndrome without diarrhea: Secondary | ICD-10-CM | POA: Diagnosis not present

## 2016-01-06 DIAGNOSIS — M25062 Hemarthrosis, left knee: Secondary | ICD-10-CM | POA: Diagnosis not present

## 2016-01-06 DIAGNOSIS — D509 Iron deficiency anemia, unspecified: Secondary | ICD-10-CM | POA: Diagnosis not present

## 2016-01-06 DIAGNOSIS — Z832 Family history of diseases of the blood and blood-forming organs and certain disorders involving the immune mechanism: Secondary | ICD-10-CM | POA: Diagnosis not present

## 2016-01-06 DIAGNOSIS — D128 Benign neoplasm of rectum: Secondary | ICD-10-CM | POA: Diagnosis not present

## 2016-01-06 DIAGNOSIS — I10 Essential (primary) hypertension: Secondary | ICD-10-CM | POA: Diagnosis not present

## 2016-01-06 DIAGNOSIS — Z886 Allergy status to analgesic agent status: Secondary | ICD-10-CM | POA: Diagnosis not present

## 2016-01-06 DIAGNOSIS — K5730 Diverticulosis of large intestine without perforation or abscess without bleeding: Secondary | ICD-10-CM | POA: Diagnosis not present

## 2016-01-06 DIAGNOSIS — K632 Fistula of intestine: Secondary | ICD-10-CM | POA: Diagnosis not present

## 2016-01-06 DIAGNOSIS — D66 Hereditary factor VIII deficiency: Secondary | ICD-10-CM | POA: Diagnosis not present

## 2016-01-06 DIAGNOSIS — Z1211 Encounter for screening for malignant neoplasm of colon: Secondary | ICD-10-CM | POA: Diagnosis not present

## 2016-01-06 DIAGNOSIS — M25072 Hemarthrosis, left ankle: Secondary | ICD-10-CM | POA: Diagnosis not present

## 2016-01-06 DIAGNOSIS — F419 Anxiety disorder, unspecified: Secondary | ICD-10-CM | POA: Diagnosis not present

## 2016-01-06 DIAGNOSIS — K900 Celiac disease: Secondary | ICD-10-CM | POA: Diagnosis not present

## 2016-01-06 DIAGNOSIS — K621 Rectal polyp: Secondary | ICD-10-CM | POA: Diagnosis not present

## 2016-01-06 DIAGNOSIS — Z8379 Family history of other diseases of the digestive system: Secondary | ICD-10-CM | POA: Diagnosis not present

## 2016-01-06 DIAGNOSIS — Z8619 Personal history of other infectious and parasitic diseases: Secondary | ICD-10-CM | POA: Diagnosis not present

## 2016-01-06 DIAGNOSIS — N321 Vesicointestinal fistula: Secondary | ICD-10-CM | POA: Diagnosis not present

## 2016-01-06 DIAGNOSIS — E7800 Pure hypercholesterolemia, unspecified: Secondary | ICD-10-CM | POA: Diagnosis not present

## 2016-01-07 DIAGNOSIS — K5730 Diverticulosis of large intestine without perforation or abscess without bleeding: Secondary | ICD-10-CM | POA: Diagnosis not present

## 2016-01-07 DIAGNOSIS — K589 Irritable bowel syndrome without diarrhea: Secondary | ICD-10-CM | POA: Diagnosis not present

## 2016-01-07 DIAGNOSIS — M25062 Hemarthrosis, left knee: Secondary | ICD-10-CM | POA: Diagnosis not present

## 2016-01-07 DIAGNOSIS — Z832 Family history of diseases of the blood and blood-forming organs and certain disorders involving the immune mechanism: Secondary | ICD-10-CM | POA: Diagnosis not present

## 2016-01-07 DIAGNOSIS — Z8379 Family history of other diseases of the digestive system: Secondary | ICD-10-CM | POA: Diagnosis not present

## 2016-01-07 DIAGNOSIS — D509 Iron deficiency anemia, unspecified: Secondary | ICD-10-CM | POA: Diagnosis not present

## 2016-01-07 DIAGNOSIS — Z886 Allergy status to analgesic agent status: Secondary | ICD-10-CM | POA: Diagnosis not present

## 2016-01-07 DIAGNOSIS — F419 Anxiety disorder, unspecified: Secondary | ICD-10-CM | POA: Diagnosis not present

## 2016-01-07 DIAGNOSIS — M25072 Hemarthrosis, left ankle: Secondary | ICD-10-CM | POA: Diagnosis not present

## 2016-01-07 DIAGNOSIS — E7800 Pure hypercholesterolemia, unspecified: Secondary | ICD-10-CM | POA: Diagnosis not present

## 2016-01-07 DIAGNOSIS — N321 Vesicointestinal fistula: Secondary | ICD-10-CM | POA: Diagnosis not present

## 2016-01-07 DIAGNOSIS — I10 Essential (primary) hypertension: Secondary | ICD-10-CM | POA: Diagnosis not present

## 2016-01-07 DIAGNOSIS — Z8619 Personal history of other infectious and parasitic diseases: Secondary | ICD-10-CM | POA: Diagnosis not present

## 2016-01-07 DIAGNOSIS — K900 Celiac disease: Secondary | ICD-10-CM | POA: Diagnosis not present

## 2016-01-07 DIAGNOSIS — K632 Fistula of intestine: Secondary | ICD-10-CM | POA: Diagnosis not present

## 2016-01-07 DIAGNOSIS — K621 Rectal polyp: Secondary | ICD-10-CM | POA: Diagnosis not present

## 2016-01-07 DIAGNOSIS — D66 Hereditary factor VIII deficiency: Secondary | ICD-10-CM | POA: Diagnosis not present

## 2016-01-13 DIAGNOSIS — M65332 Trigger finger, left middle finger: Secondary | ICD-10-CM | POA: Diagnosis not present

## 2016-01-17 DIAGNOSIS — L988 Other specified disorders of the skin and subcutaneous tissue: Secondary | ICD-10-CM | POA: Diagnosis not present

## 2016-01-17 DIAGNOSIS — N321 Vesicointestinal fistula: Secondary | ICD-10-CM | POA: Diagnosis not present

## 2016-01-17 DIAGNOSIS — N390 Urinary tract infection, site not specified: Secondary | ICD-10-CM | POA: Diagnosis not present

## 2016-01-21 DIAGNOSIS — M65332 Trigger finger, left middle finger: Secondary | ICD-10-CM | POA: Diagnosis not present

## 2016-01-23 DIAGNOSIS — N321 Vesicointestinal fistula: Secondary | ICD-10-CM | POA: Diagnosis not present

## 2016-01-23 DIAGNOSIS — M65332 Trigger finger, left middle finger: Secondary | ICD-10-CM | POA: Diagnosis not present

## 2016-01-23 DIAGNOSIS — Z01818 Encounter for other preprocedural examination: Secondary | ICD-10-CM | POA: Diagnosis not present

## 2016-01-23 DIAGNOSIS — K5732 Diverticulitis of large intestine without perforation or abscess without bleeding: Secondary | ICD-10-CM | POA: Diagnosis not present

## 2016-01-23 DIAGNOSIS — D66 Hereditary factor VIII deficiency: Secondary | ICD-10-CM | POA: Diagnosis not present

## 2016-02-03 DIAGNOSIS — K632 Fistula of intestine: Secondary | ICD-10-CM | POA: Diagnosis not present

## 2016-02-03 DIAGNOSIS — Z01818 Encounter for other preprocedural examination: Secondary | ICD-10-CM | POA: Diagnosis not present

## 2016-02-03 DIAGNOSIS — D66 Hereditary factor VIII deficiency: Secondary | ICD-10-CM | POA: Diagnosis not present

## 2016-02-03 DIAGNOSIS — I10 Essential (primary) hypertension: Secondary | ICD-10-CM | POA: Diagnosis not present

## 2016-02-03 DIAGNOSIS — Z01810 Encounter for preprocedural cardiovascular examination: Secondary | ICD-10-CM | POA: Diagnosis not present

## 2016-02-03 DIAGNOSIS — N321 Vesicointestinal fistula: Secondary | ICD-10-CM | POA: Diagnosis not present

## 2016-02-11 DIAGNOSIS — K632 Fistula of intestine: Secondary | ICD-10-CM | POA: Diagnosis not present

## 2016-02-11 DIAGNOSIS — K5732 Diverticulitis of large intestine without perforation or abscess without bleeding: Secondary | ICD-10-CM | POA: Diagnosis not present

## 2016-02-11 DIAGNOSIS — K5792 Diverticulitis of intestine, part unspecified, without perforation or abscess without bleeding: Secondary | ICD-10-CM | POA: Diagnosis not present

## 2016-02-11 DIAGNOSIS — I10 Essential (primary) hypertension: Secondary | ICD-10-CM | POA: Diagnosis not present

## 2016-02-11 DIAGNOSIS — D7589 Other specified diseases of blood and blood-forming organs: Secondary | ICD-10-CM | POA: Diagnosis not present

## 2016-02-11 DIAGNOSIS — K5669 Other intestinal obstruction: Secondary | ICD-10-CM | POA: Diagnosis not present

## 2016-02-11 DIAGNOSIS — M25512 Pain in left shoulder: Secondary | ICD-10-CM | POA: Diagnosis not present

## 2016-02-11 DIAGNOSIS — M25519 Pain in unspecified shoulder: Secondary | ICD-10-CM | POA: Diagnosis not present

## 2016-02-11 DIAGNOSIS — D509 Iron deficiency anemia, unspecified: Secondary | ICD-10-CM | POA: Diagnosis not present

## 2016-02-11 DIAGNOSIS — N501 Vascular disorders of male genital organs: Secondary | ICD-10-CM | POA: Diagnosis not present

## 2016-02-11 DIAGNOSIS — N359 Urethral stricture, unspecified: Secondary | ICD-10-CM | POA: Diagnosis not present

## 2016-02-11 DIAGNOSIS — G8918 Other acute postprocedural pain: Secondary | ICD-10-CM | POA: Diagnosis not present

## 2016-02-11 DIAGNOSIS — K91870 Postprocedural hematoma of a digestive system organ or structure following a digestive system procedure: Secondary | ICD-10-CM | POA: Diagnosis not present

## 2016-02-11 DIAGNOSIS — M25511 Pain in right shoulder: Secondary | ICD-10-CM | POA: Diagnosis not present

## 2016-02-11 DIAGNOSIS — B370 Candidal stomatitis: Secondary | ICD-10-CM | POA: Diagnosis not present

## 2016-02-11 DIAGNOSIS — K900 Celiac disease: Secondary | ICD-10-CM | POA: Diagnosis not present

## 2016-02-11 DIAGNOSIS — K5660 Unspecified intestinal obstruction: Secondary | ICD-10-CM | POA: Diagnosis not present

## 2016-02-11 DIAGNOSIS — R000 Tachycardia, unspecified: Secondary | ICD-10-CM | POA: Diagnosis not present

## 2016-02-11 DIAGNOSIS — R197 Diarrhea, unspecified: Secondary | ICD-10-CM | POA: Diagnosis not present

## 2016-02-11 DIAGNOSIS — D62 Acute posthemorrhagic anemia: Secondary | ICD-10-CM | POA: Diagnosis not present

## 2016-02-11 DIAGNOSIS — R0789 Other chest pain: Secondary | ICD-10-CM | POA: Diagnosis not present

## 2016-02-11 DIAGNOSIS — R509 Fever, unspecified: Secondary | ICD-10-CM | POA: Diagnosis not present

## 2016-02-11 DIAGNOSIS — N321 Vesicointestinal fistula: Secondary | ICD-10-CM | POA: Diagnosis not present

## 2016-02-11 DIAGNOSIS — K219 Gastro-esophageal reflux disease without esophagitis: Secondary | ICD-10-CM | POA: Diagnosis not present

## 2016-02-11 DIAGNOSIS — D66 Hereditary factor VIII deficiency: Secondary | ICD-10-CM | POA: Diagnosis not present

## 2016-02-12 DIAGNOSIS — R0789 Other chest pain: Secondary | ICD-10-CM | POA: Diagnosis not present

## 2016-02-12 DIAGNOSIS — D66 Hereditary factor VIII deficiency: Secondary | ICD-10-CM | POA: Diagnosis not present

## 2016-02-13 DIAGNOSIS — D66 Hereditary factor VIII deficiency: Secondary | ICD-10-CM | POA: Diagnosis not present

## 2016-02-14 DIAGNOSIS — M25519 Pain in unspecified shoulder: Secondary | ICD-10-CM | POA: Diagnosis not present

## 2016-02-14 DIAGNOSIS — K5732 Diverticulitis of large intestine without perforation or abscess without bleeding: Secondary | ICD-10-CM | POA: Diagnosis not present

## 2016-02-14 DIAGNOSIS — D66 Hereditary factor VIII deficiency: Secondary | ICD-10-CM | POA: Diagnosis not present

## 2016-02-15 DIAGNOSIS — D66 Hereditary factor VIII deficiency: Secondary | ICD-10-CM | POA: Diagnosis not present

## 2016-02-15 DIAGNOSIS — K5732 Diverticulitis of large intestine without perforation or abscess without bleeding: Secondary | ICD-10-CM | POA: Diagnosis not present

## 2016-02-15 DIAGNOSIS — M25519 Pain in unspecified shoulder: Secondary | ICD-10-CM | POA: Diagnosis not present

## 2016-02-16 DIAGNOSIS — D66 Hereditary factor VIII deficiency: Secondary | ICD-10-CM | POA: Diagnosis not present

## 2016-02-16 DIAGNOSIS — M25519 Pain in unspecified shoulder: Secondary | ICD-10-CM | POA: Diagnosis not present

## 2016-02-16 DIAGNOSIS — K5732 Diverticulitis of large intestine without perforation or abscess without bleeding: Secondary | ICD-10-CM | POA: Diagnosis not present

## 2016-02-17 DIAGNOSIS — N321 Vesicointestinal fistula: Secondary | ICD-10-CM | POA: Diagnosis not present

## 2016-02-17 DIAGNOSIS — R509 Fever, unspecified: Secondary | ICD-10-CM | POA: Diagnosis not present

## 2016-02-17 DIAGNOSIS — D66 Hereditary factor VIII deficiency: Secondary | ICD-10-CM | POA: Diagnosis not present

## 2016-02-17 DIAGNOSIS — K5669 Other intestinal obstruction: Secondary | ICD-10-CM | POA: Diagnosis not present

## 2016-02-18 DIAGNOSIS — D66 Hereditary factor VIII deficiency: Secondary | ICD-10-CM | POA: Diagnosis not present

## 2016-02-18 DIAGNOSIS — R000 Tachycardia, unspecified: Secondary | ICD-10-CM | POA: Diagnosis not present

## 2016-02-19 DIAGNOSIS — D66 Hereditary factor VIII deficiency: Secondary | ICD-10-CM | POA: Diagnosis not present

## 2016-02-20 DIAGNOSIS — D66 Hereditary factor VIII deficiency: Secondary | ICD-10-CM | POA: Diagnosis not present

## 2016-02-21 DIAGNOSIS — D66 Hereditary factor VIII deficiency: Secondary | ICD-10-CM | POA: Diagnosis not present

## 2016-02-21 DIAGNOSIS — K5660 Unspecified intestinal obstruction: Secondary | ICD-10-CM | POA: Diagnosis not present

## 2016-02-21 DIAGNOSIS — N501 Vascular disorders of male genital organs: Secondary | ICD-10-CM | POA: Diagnosis not present

## 2016-02-21 DIAGNOSIS — K5732 Diverticulitis of large intestine without perforation or abscess without bleeding: Secondary | ICD-10-CM | POA: Diagnosis not present

## 2016-02-22 DIAGNOSIS — D66 Hereditary factor VIII deficiency: Secondary | ICD-10-CM | POA: Diagnosis not present

## 2016-02-22 DIAGNOSIS — K5732 Diverticulitis of large intestine without perforation or abscess without bleeding: Secondary | ICD-10-CM | POA: Diagnosis not present

## 2016-02-22 DIAGNOSIS — N501 Vascular disorders of male genital organs: Secondary | ICD-10-CM | POA: Diagnosis not present

## 2016-02-22 DIAGNOSIS — K5660 Unspecified intestinal obstruction: Secondary | ICD-10-CM | POA: Diagnosis not present

## 2016-02-23 DIAGNOSIS — N501 Vascular disorders of male genital organs: Secondary | ICD-10-CM | POA: Diagnosis not present

## 2016-02-23 DIAGNOSIS — K5732 Diverticulitis of large intestine without perforation or abscess without bleeding: Secondary | ICD-10-CM | POA: Diagnosis not present

## 2016-02-23 DIAGNOSIS — D66 Hereditary factor VIII deficiency: Secondary | ICD-10-CM | POA: Diagnosis not present

## 2016-02-23 DIAGNOSIS — K5660 Unspecified intestinal obstruction: Secondary | ICD-10-CM | POA: Diagnosis not present

## 2016-02-26 DIAGNOSIS — R079 Chest pain, unspecified: Secondary | ICD-10-CM | POA: Diagnosis not present

## 2016-02-26 DIAGNOSIS — K5732 Diverticulitis of large intestine without perforation or abscess without bleeding: Secondary | ICD-10-CM | POA: Diagnosis not present

## 2016-02-26 DIAGNOSIS — F411 Generalized anxiety disorder: Secondary | ICD-10-CM | POA: Diagnosis not present

## 2016-02-26 DIAGNOSIS — Z466 Encounter for fitting and adjustment of urinary device: Secondary | ICD-10-CM | POA: Diagnosis not present

## 2016-02-26 DIAGNOSIS — T83498A Other mechanical complication of other prosthetic devices, implants and grafts of genital tract, initial encounter: Secondary | ICD-10-CM | POA: Diagnosis not present

## 2016-02-26 DIAGNOSIS — E782 Mixed hyperlipidemia: Secondary | ICD-10-CM | POA: Diagnosis not present

## 2016-02-26 DIAGNOSIS — B182 Chronic viral hepatitis C: Secondary | ICD-10-CM | POA: Diagnosis not present

## 2016-02-26 DIAGNOSIS — N3289 Other specified disorders of bladder: Secondary | ICD-10-CM | POA: Diagnosis not present

## 2016-02-26 DIAGNOSIS — R000 Tachycardia, unspecified: Secondary | ICD-10-CM | POA: Diagnosis not present

## 2016-02-26 DIAGNOSIS — M362 Hemophilic arthropathy: Secondary | ICD-10-CM | POA: Diagnosis not present

## 2016-02-26 DIAGNOSIS — N329 Bladder disorder, unspecified: Secondary | ICD-10-CM | POA: Diagnosis not present

## 2016-02-26 DIAGNOSIS — R918 Other nonspecific abnormal finding of lung field: Secondary | ICD-10-CM | POA: Diagnosis not present

## 2016-02-26 DIAGNOSIS — I10 Essential (primary) hypertension: Secondary | ICD-10-CM | POA: Diagnosis not present

## 2016-02-26 DIAGNOSIS — R509 Fever, unspecified: Secondary | ICD-10-CM | POA: Diagnosis not present

## 2016-02-26 DIAGNOSIS — R319 Hematuria, unspecified: Secondary | ICD-10-CM | POA: Diagnosis not present

## 2016-02-26 DIAGNOSIS — Z9889 Other specified postprocedural states: Secondary | ICD-10-CM | POA: Diagnosis not present

## 2016-02-26 DIAGNOSIS — K6289 Other specified diseases of anus and rectum: Secondary | ICD-10-CM | POA: Diagnosis not present

## 2016-02-26 DIAGNOSIS — A419 Sepsis, unspecified organism: Secondary | ICD-10-CM | POA: Diagnosis not present

## 2016-02-26 DIAGNOSIS — K929 Disease of digestive system, unspecified: Secondary | ICD-10-CM | POA: Diagnosis not present

## 2016-02-26 DIAGNOSIS — B1920 Unspecified viral hepatitis C without hepatic coma: Secondary | ICD-10-CM | POA: Diagnosis not present

## 2016-02-26 DIAGNOSIS — E7800 Pure hypercholesterolemia, unspecified: Secondary | ICD-10-CM | POA: Diagnosis not present

## 2016-02-26 DIAGNOSIS — K632 Fistula of intestine: Secondary | ICD-10-CM | POA: Diagnosis not present

## 2016-02-26 DIAGNOSIS — D689 Coagulation defect, unspecified: Secondary | ICD-10-CM | POA: Diagnosis not present

## 2016-02-26 DIAGNOSIS — D68318 Other hemorrhagic disorder due to intrinsic circulating anticoagulants, antibodies, or inhibitors: Secondary | ICD-10-CM | POA: Diagnosis not present

## 2016-02-26 DIAGNOSIS — K580 Irritable bowel syndrome with diarrhea: Secondary | ICD-10-CM | POA: Diagnosis not present

## 2016-02-26 DIAGNOSIS — J90 Pleural effusion, not elsewhere classified: Secondary | ICD-10-CM | POA: Diagnosis not present

## 2016-02-26 DIAGNOSIS — I739 Peripheral vascular disease, unspecified: Secondary | ICD-10-CM | POA: Diagnosis not present

## 2016-02-26 DIAGNOSIS — D62 Acute posthemorrhagic anemia: Secondary | ICD-10-CM | POA: Diagnosis not present

## 2016-02-26 DIAGNOSIS — K219 Gastro-esophageal reflux disease without esophagitis: Secondary | ICD-10-CM | POA: Diagnosis not present

## 2016-02-26 DIAGNOSIS — Z980 Intestinal bypass and anastomosis status: Secondary | ICD-10-CM | POA: Diagnosis not present

## 2016-02-26 DIAGNOSIS — J189 Pneumonia, unspecified organism: Secondary | ICD-10-CM | POA: Diagnosis not present

## 2016-02-26 DIAGNOSIS — K900 Celiac disease: Secondary | ICD-10-CM | POA: Diagnosis not present

## 2016-02-26 DIAGNOSIS — R300 Dysuria: Secondary | ICD-10-CM | POA: Diagnosis not present

## 2016-02-26 DIAGNOSIS — F419 Anxiety disorder, unspecified: Secondary | ICD-10-CM | POA: Diagnosis not present

## 2016-02-26 DIAGNOSIS — G4700 Insomnia, unspecified: Secondary | ICD-10-CM | POA: Diagnosis not present

## 2016-02-26 DIAGNOSIS — N321 Vesicointestinal fistula: Secondary | ICD-10-CM | POA: Diagnosis not present

## 2016-02-26 DIAGNOSIS — K5660 Unspecified intestinal obstruction: Secondary | ICD-10-CM | POA: Diagnosis not present

## 2016-02-26 DIAGNOSIS — K529 Noninfective gastroenteritis and colitis, unspecified: Secondary | ICD-10-CM | POA: Diagnosis not present

## 2016-02-26 DIAGNOSIS — Z906 Acquired absence of other parts of urinary tract: Secondary | ICD-10-CM | POA: Diagnosis not present

## 2016-02-26 DIAGNOSIS — R188 Other ascites: Secondary | ICD-10-CM | POA: Diagnosis not present

## 2016-02-26 DIAGNOSIS — T83198A Other mechanical complication of other urinary devices and implants, initial encounter: Secondary | ICD-10-CM | POA: Diagnosis not present

## 2016-02-26 DIAGNOSIS — K5669 Other intestinal obstruction: Secondary | ICD-10-CM | POA: Diagnosis not present

## 2016-02-26 DIAGNOSIS — Z48816 Encounter for surgical aftercare following surgery on the genitourinary system: Secondary | ICD-10-CM | POA: Diagnosis not present

## 2016-02-26 DIAGNOSIS — R197 Diarrhea, unspecified: Secondary | ICD-10-CM | POA: Diagnosis not present

## 2016-02-26 DIAGNOSIS — R310 Gross hematuria: Secondary | ICD-10-CM | POA: Diagnosis not present

## 2016-02-26 DIAGNOSIS — D66 Hereditary factor VIII deficiency: Secondary | ICD-10-CM | POA: Diagnosis not present

## 2016-02-26 DIAGNOSIS — D509 Iron deficiency anemia, unspecified: Secondary | ICD-10-CM | POA: Diagnosis not present

## 2016-02-26 DIAGNOSIS — Z9049 Acquired absence of other specified parts of digestive tract: Secondary | ICD-10-CM | POA: Diagnosis not present

## 2016-02-26 DIAGNOSIS — Z8619 Personal history of other infectious and parasitic diseases: Secondary | ICD-10-CM | POA: Diagnosis not present

## 2016-02-28 DIAGNOSIS — N329 Bladder disorder, unspecified: Secondary | ICD-10-CM | POA: Diagnosis not present

## 2016-02-28 DIAGNOSIS — K929 Disease of digestive system, unspecified: Secondary | ICD-10-CM | POA: Diagnosis not present

## 2016-02-28 DIAGNOSIS — R319 Hematuria, unspecified: Secondary | ICD-10-CM | POA: Diagnosis not present

## 2016-02-28 DIAGNOSIS — N3289 Other specified disorders of bladder: Secondary | ICD-10-CM | POA: Diagnosis not present

## 2016-02-28 DIAGNOSIS — T83498A Other mechanical complication of other prosthetic devices, implants and grafts of genital tract, initial encounter: Secondary | ICD-10-CM | POA: Diagnosis not present

## 2016-02-28 DIAGNOSIS — T83198A Other mechanical complication of other urinary devices and implants, initial encounter: Secondary | ICD-10-CM | POA: Diagnosis not present

## 2016-02-29 DIAGNOSIS — R509 Fever, unspecified: Secondary | ICD-10-CM | POA: Diagnosis not present

## 2016-02-29 DIAGNOSIS — R918 Other nonspecific abnormal finding of lung field: Secondary | ICD-10-CM | POA: Diagnosis not present

## 2016-02-29 DIAGNOSIS — R319 Hematuria, unspecified: Secondary | ICD-10-CM | POA: Diagnosis not present

## 2016-02-29 DIAGNOSIS — D689 Coagulation defect, unspecified: Secondary | ICD-10-CM | POA: Diagnosis not present

## 2016-02-29 DIAGNOSIS — D66 Hereditary factor VIII deficiency: Secondary | ICD-10-CM | POA: Diagnosis not present

## 2016-02-29 DIAGNOSIS — N3289 Other specified disorders of bladder: Secondary | ICD-10-CM | POA: Diagnosis not present

## 2016-02-29 DIAGNOSIS — R000 Tachycardia, unspecified: Secondary | ICD-10-CM | POA: Diagnosis not present

## 2016-03-01 DIAGNOSIS — R319 Hematuria, unspecified: Secondary | ICD-10-CM | POA: Diagnosis not present

## 2016-03-01 DIAGNOSIS — Z9889 Other specified postprocedural states: Secondary | ICD-10-CM | POA: Diagnosis not present

## 2016-03-01 DIAGNOSIS — N3289 Other specified disorders of bladder: Secondary | ICD-10-CM | POA: Diagnosis not present

## 2016-03-01 DIAGNOSIS — R188 Other ascites: Secondary | ICD-10-CM | POA: Diagnosis not present

## 2016-03-01 DIAGNOSIS — R079 Chest pain, unspecified: Secondary | ICD-10-CM | POA: Diagnosis not present

## 2016-03-01 DIAGNOSIS — D689 Coagulation defect, unspecified: Secondary | ICD-10-CM | POA: Diagnosis not present

## 2016-03-01 DIAGNOSIS — R000 Tachycardia, unspecified: Secondary | ICD-10-CM | POA: Diagnosis not present

## 2016-03-01 DIAGNOSIS — K5669 Other intestinal obstruction: Secondary | ICD-10-CM | POA: Diagnosis not present

## 2016-03-01 DIAGNOSIS — D66 Hereditary factor VIII deficiency: Secondary | ICD-10-CM | POA: Diagnosis not present

## 2016-03-02 DIAGNOSIS — R918 Other nonspecific abnormal finding of lung field: Secondary | ICD-10-CM | POA: Diagnosis not present

## 2016-03-02 DIAGNOSIS — J90 Pleural effusion, not elsewhere classified: Secondary | ICD-10-CM | POA: Diagnosis not present

## 2016-03-02 DIAGNOSIS — R319 Hematuria, unspecified: Secondary | ICD-10-CM | POA: Diagnosis not present

## 2016-03-02 DIAGNOSIS — D66 Hereditary factor VIII deficiency: Secondary | ICD-10-CM | POA: Diagnosis not present

## 2016-03-02 DIAGNOSIS — D689 Coagulation defect, unspecified: Secondary | ICD-10-CM | POA: Diagnosis not present

## 2016-03-02 DIAGNOSIS — R509 Fever, unspecified: Secondary | ICD-10-CM | POA: Diagnosis not present

## 2016-03-02 DIAGNOSIS — N3289 Other specified disorders of bladder: Secondary | ICD-10-CM | POA: Diagnosis not present

## 2016-03-03 DIAGNOSIS — D66 Hereditary factor VIII deficiency: Secondary | ICD-10-CM | POA: Diagnosis not present

## 2016-03-03 DIAGNOSIS — N3289 Other specified disorders of bladder: Secondary | ICD-10-CM | POA: Diagnosis not present

## 2016-03-03 DIAGNOSIS — D62 Acute posthemorrhagic anemia: Secondary | ICD-10-CM | POA: Diagnosis not present

## 2016-03-04 ENCOUNTER — Telehealth: Payer: Self-pay

## 2016-03-04 ENCOUNTER — Telehealth: Payer: Self-pay | Admitting: *Deleted

## 2016-03-04 DIAGNOSIS — D66 Hereditary factor VIII deficiency: Secondary | ICD-10-CM | POA: Diagnosis not present

## 2016-03-04 DIAGNOSIS — D62 Acute posthemorrhagic anemia: Secondary | ICD-10-CM | POA: Diagnosis not present

## 2016-03-04 DIAGNOSIS — J189 Pneumonia, unspecified organism: Secondary | ICD-10-CM | POA: Diagnosis not present

## 2016-03-04 DIAGNOSIS — R509 Fever, unspecified: Secondary | ICD-10-CM | POA: Diagnosis not present

## 2016-03-04 DIAGNOSIS — N3289 Other specified disorders of bladder: Secondary | ICD-10-CM | POA: Diagnosis not present

## 2016-03-04 NOTE — Telephone Encounter (Signed)
Patient called stating he is in the hospital and will be getting out soon and needs a refill on his factor rahf medication for when he returns home and then back to as needed. States he will have home health administer it but needs a prescription to be done to [facility] for it to be sent to his house as soon as possible. Patient states he has been talking with [facility] in [place] and is unsure if the prescription needs to be sent to the [facility] [place] or [place] location but he will call and find out and give [place] a call back to clarify.

## 2016-03-04 NOTE — Telephone Encounter (Signed)
Called pt for clarification. He reports he is at [facility]. He went in for fistula repair and developed hematuria after being discharged home. Pt is requesting a refill for Factor because he will need 10 doses after discharge from hospital.  He reports hemophilia doctors have been seeing him in the hospital and they recommend the above.  Discussed with Dr. Truett Perna: Have MD call to discuss recommendation.

## 2016-03-05 DIAGNOSIS — R509 Fever, unspecified: Secondary | ICD-10-CM | POA: Diagnosis not present

## 2016-03-05 DIAGNOSIS — D66 Hereditary factor VIII deficiency: Secondary | ICD-10-CM | POA: Diagnosis not present

## 2016-03-05 DIAGNOSIS — J189 Pneumonia, unspecified organism: Secondary | ICD-10-CM | POA: Diagnosis not present

## 2016-03-05 DIAGNOSIS — N3289 Other specified disorders of bladder: Secondary | ICD-10-CM | POA: Diagnosis not present

## 2016-03-05 NOTE — Telephone Encounter (Signed)
Pt returned call to say the doctor that has been managing his hemphilia in the hospital ordered his medication for use post discharge. Dr. Truett Perna made aware.

## 2016-03-06 DIAGNOSIS — R300 Dysuria: Secondary | ICD-10-CM | POA: Diagnosis not present

## 2016-03-06 DIAGNOSIS — D66 Hereditary factor VIII deficiency: Secondary | ICD-10-CM | POA: Diagnosis not present

## 2016-03-06 DIAGNOSIS — Z48816 Encounter for surgical aftercare following surgery on the genitourinary system: Secondary | ICD-10-CM | POA: Diagnosis not present

## 2016-03-06 DIAGNOSIS — Z466 Encounter for fitting and adjustment of urinary device: Secondary | ICD-10-CM | POA: Diagnosis not present

## 2016-03-06 NOTE — Telephone Encounter (Signed)
Erroneous encounter

## 2016-03-08 DIAGNOSIS — K580 Irritable bowel syndrome with diarrhea: Secondary | ICD-10-CM | POA: Diagnosis not present

## 2016-03-08 DIAGNOSIS — K6289 Other specified diseases of anus and rectum: Secondary | ICD-10-CM | POA: Diagnosis not present

## 2016-03-08 DIAGNOSIS — Z9049 Acquired absence of other specified parts of digestive tract: Secondary | ICD-10-CM | POA: Diagnosis not present

## 2016-03-08 DIAGNOSIS — Z48816 Encounter for surgical aftercare following surgery on the genitourinary system: Secondary | ICD-10-CM | POA: Diagnosis not present

## 2016-03-08 DIAGNOSIS — E782 Mixed hyperlipidemia: Secondary | ICD-10-CM | POA: Diagnosis not present

## 2016-03-08 DIAGNOSIS — N3289 Other specified disorders of bladder: Secondary | ICD-10-CM | POA: Diagnosis not present

## 2016-03-08 DIAGNOSIS — I10 Essential (primary) hypertension: Secondary | ICD-10-CM | POA: Diagnosis not present

## 2016-03-08 DIAGNOSIS — K529 Noninfective gastroenteritis and colitis, unspecified: Secondary | ICD-10-CM | POA: Diagnosis not present

## 2016-03-08 DIAGNOSIS — N321 Vesicointestinal fistula: Secondary | ICD-10-CM | POA: Diagnosis not present

## 2016-03-08 DIAGNOSIS — B1920 Unspecified viral hepatitis C without hepatic coma: Secondary | ICD-10-CM | POA: Diagnosis not present

## 2016-03-08 DIAGNOSIS — R319 Hematuria, unspecified: Secondary | ICD-10-CM | POA: Diagnosis not present

## 2016-03-08 DIAGNOSIS — D66 Hereditary factor VIII deficiency: Secondary | ICD-10-CM | POA: Diagnosis not present

## 2016-03-08 DIAGNOSIS — F411 Generalized anxiety disorder: Secondary | ICD-10-CM | POA: Diagnosis not present

## 2016-03-08 DIAGNOSIS — R300 Dysuria: Secondary | ICD-10-CM | POA: Diagnosis not present

## 2016-03-08 DIAGNOSIS — I739 Peripheral vascular disease, unspecified: Secondary | ICD-10-CM | POA: Diagnosis not present

## 2016-03-08 DIAGNOSIS — K219 Gastro-esophageal reflux disease without esophagitis: Secondary | ICD-10-CM | POA: Diagnosis not present

## 2016-03-08 DIAGNOSIS — R310 Gross hematuria: Secondary | ICD-10-CM | POA: Diagnosis not present

## 2016-03-08 DIAGNOSIS — Z466 Encounter for fitting and adjustment of urinary device: Secondary | ICD-10-CM | POA: Diagnosis not present

## 2016-03-08 DIAGNOSIS — Z906 Acquired absence of other parts of urinary tract: Secondary | ICD-10-CM | POA: Diagnosis not present

## 2016-03-08 DIAGNOSIS — K900 Celiac disease: Secondary | ICD-10-CM | POA: Diagnosis not present

## 2016-03-08 DIAGNOSIS — D68318 Other hemorrhagic disorder due to intrinsic circulating anticoagulants, antibodies, or inhibitors: Secondary | ICD-10-CM | POA: Diagnosis not present

## 2016-03-09 DIAGNOSIS — D66 Hereditary factor VIII deficiency: Secondary | ICD-10-CM | POA: Diagnosis not present

## 2016-03-09 DIAGNOSIS — K900 Celiac disease: Secondary | ICD-10-CM | POA: Diagnosis not present

## 2016-03-09 DIAGNOSIS — R319 Hematuria, unspecified: Secondary | ICD-10-CM | POA: Diagnosis not present

## 2016-03-09 DIAGNOSIS — B182 Chronic viral hepatitis C: Secondary | ICD-10-CM | POA: Diagnosis not present

## 2016-03-10 DIAGNOSIS — D66 Hereditary factor VIII deficiency: Secondary | ICD-10-CM | POA: Diagnosis not present

## 2016-03-10 DIAGNOSIS — K900 Celiac disease: Secondary | ICD-10-CM | POA: Diagnosis not present

## 2016-03-10 DIAGNOSIS — R319 Hematuria, unspecified: Secondary | ICD-10-CM | POA: Diagnosis not present

## 2016-03-10 DIAGNOSIS — K5732 Diverticulitis of large intestine without perforation or abscess without bleeding: Secondary | ICD-10-CM | POA: Diagnosis not present

## 2016-03-11 DIAGNOSIS — K632 Fistula of intestine: Secondary | ICD-10-CM | POA: Diagnosis not present

## 2016-03-11 DIAGNOSIS — R319 Hematuria, unspecified: Secondary | ICD-10-CM | POA: Diagnosis not present

## 2016-03-12 DIAGNOSIS — R319 Hematuria, unspecified: Secondary | ICD-10-CM | POA: Diagnosis not present

## 2016-03-12 DIAGNOSIS — K632 Fistula of intestine: Secondary | ICD-10-CM | POA: Diagnosis not present

## 2016-03-13 DIAGNOSIS — K632 Fistula of intestine: Secondary | ICD-10-CM | POA: Diagnosis not present

## 2016-03-13 DIAGNOSIS — D66 Hereditary factor VIII deficiency: Secondary | ICD-10-CM | POA: Diagnosis not present

## 2016-03-13 DIAGNOSIS — R319 Hematuria, unspecified: Secondary | ICD-10-CM | POA: Diagnosis not present

## 2016-03-18 DIAGNOSIS — Z9114 Patient's other noncompliance with medication regimen: Secondary | ICD-10-CM | POA: Diagnosis not present

## 2016-03-18 DIAGNOSIS — K632 Fistula of intestine: Secondary | ICD-10-CM | POA: Diagnosis not present

## 2016-03-18 DIAGNOSIS — B182 Chronic viral hepatitis C: Secondary | ICD-10-CM | POA: Diagnosis not present

## 2016-03-18 DIAGNOSIS — D66 Hereditary factor VIII deficiency: Secondary | ICD-10-CM | POA: Diagnosis not present

## 2016-03-18 DIAGNOSIS — K921 Melena: Secondary | ICD-10-CM | POA: Diagnosis not present

## 2016-03-18 DIAGNOSIS — Z79899 Other long term (current) drug therapy: Secondary | ICD-10-CM | POA: Diagnosis not present

## 2016-03-18 DIAGNOSIS — M362 Hemophilic arthropathy: Secondary | ICD-10-CM | POA: Diagnosis not present

## 2016-03-18 DIAGNOSIS — M7021 Olecranon bursitis, right elbow: Secondary | ICD-10-CM | POA: Diagnosis not present

## 2016-03-18 DIAGNOSIS — D68318 Other hemorrhagic disorder due to intrinsic circulating anticoagulants, antibodies, or inhibitors: Secondary | ICD-10-CM | POA: Diagnosis not present

## 2016-03-18 DIAGNOSIS — R58 Hemorrhage, not elsewhere classified: Secondary | ICD-10-CM | POA: Diagnosis not present

## 2016-03-18 DIAGNOSIS — F329 Major depressive disorder, single episode, unspecified: Secondary | ICD-10-CM | POA: Diagnosis not present

## 2016-03-18 DIAGNOSIS — R2241 Localized swelling, mass and lump, right lower limb: Secondary | ICD-10-CM | POA: Diagnosis not present

## 2016-03-18 DIAGNOSIS — F419 Anxiety disorder, unspecified: Secondary | ICD-10-CM | POA: Diagnosis not present

## 2016-03-18 DIAGNOSIS — D500 Iron deficiency anemia secondary to blood loss (chronic): Secondary | ICD-10-CM | POA: Diagnosis not present

## 2016-03-18 DIAGNOSIS — S52021A Displaced fracture of olecranon process without intraarticular extension of right ulna, initial encounter for closed fracture: Secondary | ICD-10-CM | POA: Diagnosis not present

## 2016-03-18 DIAGNOSIS — T148 Other injury of unspecified body region: Secondary | ICD-10-CM | POA: Diagnosis not present

## 2016-03-18 DIAGNOSIS — Z9119 Patient's noncompliance with other medical treatment and regimen: Secondary | ICD-10-CM | POA: Diagnosis not present

## 2016-03-18 DIAGNOSIS — I10 Essential (primary) hypertension: Secondary | ICD-10-CM | POA: Diagnosis not present

## 2016-03-18 DIAGNOSIS — K219 Gastro-esophageal reflux disease without esophagitis: Secondary | ICD-10-CM | POA: Diagnosis not present

## 2016-03-18 DIAGNOSIS — E7800 Pure hypercholesterolemia, unspecified: Secondary | ICD-10-CM | POA: Diagnosis not present

## 2016-03-18 DIAGNOSIS — J309 Allergic rhinitis, unspecified: Secondary | ICD-10-CM | POA: Diagnosis not present

## 2016-03-18 DIAGNOSIS — K900 Celiac disease: Secondary | ICD-10-CM | POA: Diagnosis not present

## 2016-03-18 DIAGNOSIS — R140 Abdominal distension (gaseous): Secondary | ICD-10-CM | POA: Diagnosis not present

## 2016-03-19 DIAGNOSIS — M7021 Olecranon bursitis, right elbow: Secondary | ICD-10-CM | POA: Diagnosis not present

## 2016-03-19 DIAGNOSIS — S52021A Displaced fracture of olecranon process without intraarticular extension of right ulna, initial encounter for closed fracture: Secondary | ICD-10-CM | POA: Diagnosis not present

## 2016-03-19 DIAGNOSIS — T148 Other injury of unspecified body region: Secondary | ICD-10-CM | POA: Diagnosis not present

## 2016-03-19 DIAGNOSIS — K921 Melena: Secondary | ICD-10-CM | POA: Diagnosis not present

## 2016-03-19 DIAGNOSIS — D68318 Other hemorrhagic disorder due to intrinsic circulating anticoagulants, antibodies, or inhibitors: Secondary | ICD-10-CM | POA: Diagnosis not present

## 2016-03-20 DIAGNOSIS — D66 Hereditary factor VIII deficiency: Secondary | ICD-10-CM | POA: Diagnosis not present

## 2016-03-21 DIAGNOSIS — K632 Fistula of intestine: Secondary | ICD-10-CM | POA: Diagnosis not present

## 2016-03-21 DIAGNOSIS — K921 Melena: Secondary | ICD-10-CM | POA: Diagnosis not present

## 2016-03-21 DIAGNOSIS — D66 Hereditary factor VIII deficiency: Secondary | ICD-10-CM | POA: Diagnosis not present

## 2016-03-21 DIAGNOSIS — R58 Hemorrhage, not elsewhere classified: Secondary | ICD-10-CM | POA: Diagnosis not present

## 2016-03-21 DIAGNOSIS — R140 Abdominal distension (gaseous): Secondary | ICD-10-CM | POA: Diagnosis not present

## 2016-03-21 DIAGNOSIS — I10 Essential (primary) hypertension: Secondary | ICD-10-CM | POA: Diagnosis not present

## 2016-03-23 DIAGNOSIS — I10 Essential (primary) hypertension: Secondary | ICD-10-CM | POA: Diagnosis not present

## 2016-03-23 DIAGNOSIS — D66 Hereditary factor VIII deficiency: Secondary | ICD-10-CM | POA: Diagnosis not present

## 2016-03-23 DIAGNOSIS — R58 Hemorrhage, not elsewhere classified: Secondary | ICD-10-CM | POA: Diagnosis not present

## 2016-03-24 DIAGNOSIS — R58 Hemorrhage, not elsewhere classified: Secondary | ICD-10-CM | POA: Diagnosis not present

## 2016-03-24 DIAGNOSIS — D66 Hereditary factor VIII deficiency: Secondary | ICD-10-CM | POA: Diagnosis not present

## 2016-03-24 DIAGNOSIS — K921 Melena: Secondary | ICD-10-CM | POA: Diagnosis not present

## 2016-03-25 DIAGNOSIS — K921 Melena: Secondary | ICD-10-CM | POA: Diagnosis not present

## 2016-03-25 DIAGNOSIS — R58 Hemorrhage, not elsewhere classified: Secondary | ICD-10-CM | POA: Diagnosis not present

## 2016-03-25 DIAGNOSIS — D66 Hereditary factor VIII deficiency: Secondary | ICD-10-CM | POA: Diagnosis not present

## 2016-03-26 DIAGNOSIS — K921 Melena: Secondary | ICD-10-CM | POA: Diagnosis not present

## 2016-03-26 DIAGNOSIS — R58 Hemorrhage, not elsewhere classified: Secondary | ICD-10-CM | POA: Diagnosis not present

## 2016-03-26 DIAGNOSIS — I10 Essential (primary) hypertension: Secondary | ICD-10-CM | POA: Diagnosis not present

## 2016-03-26 DIAGNOSIS — D66 Hereditary factor VIII deficiency: Secondary | ICD-10-CM | POA: Diagnosis not present

## 2016-03-27 DIAGNOSIS — K921 Melena: Secondary | ICD-10-CM | POA: Diagnosis not present

## 2016-03-27 DIAGNOSIS — R58 Hemorrhage, not elsewhere classified: Secondary | ICD-10-CM | POA: Diagnosis not present

## 2016-03-27 DIAGNOSIS — I10 Essential (primary) hypertension: Secondary | ICD-10-CM | POA: Diagnosis not present

## 2016-03-27 DIAGNOSIS — D66 Hereditary factor VIII deficiency: Secondary | ICD-10-CM | POA: Diagnosis not present

## 2016-04-01 DIAGNOSIS — D68318 Other hemorrhagic disorder due to intrinsic circulating anticoagulants, antibodies, or inhibitors: Secondary | ICD-10-CM | POA: Diagnosis not present

## 2016-04-01 DIAGNOSIS — Z5111 Encounter for antineoplastic chemotherapy: Secondary | ICD-10-CM | POA: Diagnosis not present

## 2016-04-01 DIAGNOSIS — D66 Hereditary factor VIII deficiency: Secondary | ICD-10-CM | POA: Diagnosis not present

## 2016-04-08 DIAGNOSIS — D66 Hereditary factor VIII deficiency: Secondary | ICD-10-CM | POA: Diagnosis not present

## 2016-04-08 DIAGNOSIS — F411 Generalized anxiety disorder: Secondary | ICD-10-CM | POA: Diagnosis not present

## 2016-04-08 DIAGNOSIS — K900 Celiac disease: Secondary | ICD-10-CM | POA: Diagnosis not present

## 2016-04-08 DIAGNOSIS — D509 Iron deficiency anemia, unspecified: Secondary | ICD-10-CM | POA: Diagnosis not present

## 2016-04-08 DIAGNOSIS — M362 Hemophilic arthropathy: Secondary | ICD-10-CM | POA: Diagnosis not present

## 2016-04-08 DIAGNOSIS — D68318 Other hemorrhagic disorder due to intrinsic circulating anticoagulants, antibodies, or inhibitors: Secondary | ICD-10-CM | POA: Diagnosis not present

## 2016-04-08 DIAGNOSIS — G8929 Other chronic pain: Secondary | ICD-10-CM | POA: Diagnosis not present

## 2016-04-08 DIAGNOSIS — I10 Essential (primary) hypertension: Secondary | ICD-10-CM | POA: Diagnosis not present

## 2016-04-08 DIAGNOSIS — M792 Neuralgia and neuritis, unspecified: Secondary | ICD-10-CM | POA: Diagnosis not present

## 2016-04-10 DIAGNOSIS — N321 Vesicointestinal fistula: Secondary | ICD-10-CM | POA: Diagnosis not present

## 2016-04-14 DIAGNOSIS — N390 Urinary tract infection, site not specified: Secondary | ICD-10-CM | POA: Diagnosis not present

## 2016-04-17 DIAGNOSIS — D66 Hereditary factor VIII deficiency: Secondary | ICD-10-CM | POA: Diagnosis not present

## 2016-04-17 DIAGNOSIS — M2550 Pain in unspecified joint: Secondary | ICD-10-CM | POA: Diagnosis not present

## 2016-04-17 DIAGNOSIS — Z09 Encounter for follow-up examination after completed treatment for conditions other than malignant neoplasm: Secondary | ICD-10-CM | POA: Diagnosis not present

## 2016-04-22 DIAGNOSIS — H40013 Open angle with borderline findings, low risk, bilateral: Secondary | ICD-10-CM | POA: Diagnosis not present

## 2016-04-29 DIAGNOSIS — D68318 Other hemorrhagic disorder due to intrinsic circulating anticoagulants, antibodies, or inhibitors: Secondary | ICD-10-CM | POA: Diagnosis not present

## 2016-04-29 DIAGNOSIS — F419 Anxiety disorder, unspecified: Secondary | ICD-10-CM | POA: Diagnosis not present

## 2016-04-29 DIAGNOSIS — R300 Dysuria: Secondary | ICD-10-CM | POA: Diagnosis not present

## 2016-04-29 DIAGNOSIS — D66 Hereditary factor VIII deficiency: Secondary | ICD-10-CM | POA: Diagnosis not present

## 2016-04-29 DIAGNOSIS — N419 Inflammatory disease of prostate, unspecified: Secondary | ICD-10-CM | POA: Diagnosis not present

## 2016-04-29 DIAGNOSIS — N411 Chronic prostatitis: Secondary | ICD-10-CM | POA: Diagnosis not present

## 2016-04-29 DIAGNOSIS — R339 Retention of urine, unspecified: Secondary | ICD-10-CM | POA: Diagnosis not present

## 2016-05-13 DIAGNOSIS — N411 Chronic prostatitis: Secondary | ICD-10-CM | POA: Diagnosis not present

## 2016-05-26 DIAGNOSIS — M65332 Trigger finger, left middle finger: Secondary | ICD-10-CM | POA: Diagnosis not present

## 2016-06-16 ENCOUNTER — Other Ambulatory Visit (HOSPITAL_BASED_OUTPATIENT_CLINIC_OR_DEPARTMENT_OTHER): Payer: BLUE CROSS/BLUE SHIELD

## 2016-06-16 ENCOUNTER — Ambulatory Visit (HOSPITAL_BASED_OUTPATIENT_CLINIC_OR_DEPARTMENT_OTHER): Payer: BLUE CROSS/BLUE SHIELD | Admitting: Nurse Practitioner

## 2016-06-16 VITALS — BP 149/96 | HR 80 | Temp 98.0°F | Resp 17 | Ht 70.0 in | Wt 170.9 lb

## 2016-06-16 DIAGNOSIS — D66 Hereditary factor VIII deficiency: Secondary | ICD-10-CM | POA: Diagnosis not present

## 2016-06-16 DIAGNOSIS — B1920 Unspecified viral hepatitis C without hepatic coma: Secondary | ICD-10-CM | POA: Diagnosis not present

## 2016-06-16 NOTE — Progress Notes (Addendum)
  [facility] OFFICE PROGRESS NOTE   Diagnosis:  Hemophilia A  INTERVAL HISTORY:   [name] returns as scheduled. On 02/11/2016 he underwent a sigmoidectomy and partial cystectomy for colovesical fistula. He was discharged home 02/23/2016. He was readmitted 02/28/2016 after presenting to the emergency room with abdominal pain. He was found to have extravasation of contrast from the bladder. He underwent an exploratory laparotomy, repair of anterior and posterior bladder defects and clot evacuation. He was discharged home 03/05/2016. He was subsequently found to have an inhibitor and completed 4 doses of Rituxan with reduction in the inhibitor. He reports he is scheduled to follow-up with the hematologist at [facility] tomorrow.   He reports most recent bleed was involving the right ankle. He feels he is recovering from the hospitalizations over the summer. He reports "prostate" problems. He completed a 30 day course of antibiotics and Flomax with no significant improvement. He is following up with urology.  Objective:  Vital signs in last 24 hours:  Blood pressure (!) 149/96, pulse 80, temperature 98 F (36.7 C), temperature source Oral, resp. rate 17, height 5\' 10"  (1.778 m), weight 170 lb 14.4 oz (77.5 kg), SpO2 99 %.    HEENT: No thrush or ulcers. Resp: Lungs clear bilaterally. Cardio: Regular rate and rhythm. GI: Abdomen soft and nontender. No hepatomegaly. Well-healed surgical incision. Vascular: No leg edema.   Lab Results:  Lab Results  Component Value Date   WBC 4.1 09/26/2015   HGB 10.4 (L) 09/26/2015   HCT 31.6 (L) 09/26/2015   MCV 91.1 09/26/2015   PLT 326 09/26/2015   NEUTROABS 16.0 (H) 09/22/2015    Imaging:  No results found.  Medications: I have reviewed the patient's current medications.  Assessment/Plan: 1. 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  2. History of hepatitis C infection. Negative hepatitis  C RNA in November 2014 3. Chronic left ankle and right elbow arthropathy. 4. Celiac disease.  5. History of vitamin D deficiency.  6. History of hypertension.  7. 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  8. diagnosis of "scoliosis."  9. 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 02/11/2016 sigmoidectomy and partial cystectomy for colovesical fistula 11. 02/28/2016 exploratory laparotomy, repair of anterior and posterior bladder defects and clot evacuation 12. Factor VIII inhibitor August 2017 status post Rituxan   Disposition: [name] appears stable. He will continue follow-up with the hematology clinic at [facility]. He would like to continue to be seen here on an annual basis. He will contact the office prior to his next visit with any problems.  Patient seen with Dr. Truett PernaSherrill.    Lonna Cobbhomas, Samul Mcinroy ANP/GNP-BC   06/16/2016  3:45 PM This was a shared visit with Lonna CobbLisa Estephany Perot. [name] is now being followed [facility] the hemat[facility] service [facility] after undergoing repair of a colovesical fistula in July. He would like to continue annual follow-up here.  Mancel BaleBrad Sherrill, M.D.

## 2016-06-17 DIAGNOSIS — N411 Chronic prostatitis: Secondary | ICD-10-CM | POA: Diagnosis not present

## 2016-06-17 DIAGNOSIS — I10 Essential (primary) hypertension: Secondary | ICD-10-CM | POA: Diagnosis not present

## 2016-06-17 DIAGNOSIS — E559 Vitamin D deficiency, unspecified: Secondary | ICD-10-CM | POA: Diagnosis not present

## 2016-06-17 DIAGNOSIS — D66 Hereditary factor VIII deficiency: Secondary | ICD-10-CM | POA: Diagnosis not present

## 2016-06-17 DIAGNOSIS — D68318 Other hemorrhagic disorder due to intrinsic circulating anticoagulants, antibodies, or inhibitors: Secondary | ICD-10-CM | POA: Diagnosis not present

## 2016-06-17 DIAGNOSIS — F329 Major depressive disorder, single episode, unspecified: Secondary | ICD-10-CM | POA: Diagnosis not present

## 2016-06-17 DIAGNOSIS — M792 Neuralgia and neuritis, unspecified: Secondary | ICD-10-CM | POA: Diagnosis not present

## 2016-06-17 DIAGNOSIS — F411 Generalized anxiety disorder: Secondary | ICD-10-CM | POA: Diagnosis not present

## 2016-06-17 LAB — HCV RNA QUANT RFLX ULTRA OR GENOTYP: HCV Quant Baseline: NOT DETECTED IU/mL

## 2016-06-18 ENCOUNTER — Telehealth: Payer: Self-pay

## 2016-06-18 NOTE — Telephone Encounter (Signed)
-----   Message from Rana SnareLisa K Thomas, NP sent at 06/18/2016  9:22 AM EST ----- Please mail Mr. Vear Clockhillips a copy of HCV lab result and forward a copy to Dr Rene KocherBatt (hematology) at [facility] and Dr Farris HasAaron Morrow.

## 2016-06-18 NOTE — Telephone Encounter (Signed)
Routed lab results to Dr. Rene KocherBatt and Dr. Kateri PlummerMorrow. Mailed lab results to pt.

## 2016-06-30 ENCOUNTER — Telehealth: Payer: Self-pay | Admitting: Nurse Practitioner

## 2016-06-30 NOTE — Telephone Encounter (Signed)
Appointments scheduled per 11/14 LOS. °

## 2016-07-01 ENCOUNTER — Ambulatory Visit (INDEPENDENT_AMBULATORY_CARE_PROVIDER_SITE_OTHER): Payer: BLUE CROSS/BLUE SHIELD | Admitting: Neurology

## 2016-07-01 ENCOUNTER — Encounter: Payer: Self-pay | Admitting: Neurology

## 2016-07-01 VITALS — BP 151/97 | HR 87 | Ht 70.0 in | Wt 171.0 lb

## 2016-07-01 DIAGNOSIS — E348 Other specified endocrine disorders: Secondary | ICD-10-CM | POA: Diagnosis not present

## 2016-07-01 NOTE — Progress Notes (Signed)
Reason for visit: Headache  Darrell Graham is an 50 y.o. male  History of present illness:  Darrell Graham is a 50 year old right-handed white male with a history of headaches. The headaches were initially daily nature, but now the headaches are less severe, may occur once or twice a week. The patient does not take any medication for that headache. The patient was given a prescription for Effexor, he never went on the medication. He returns for an evaluation. He has had repair of the rectal-bladder fistula.  Past Medical History:  Diagnosis Date  . Allergy   . Asthma   . Celiac disease   . Chronic arthropathy    left ankle & right elbow  . Deviated nasal septum   . Diverticulitis   . Glaucoma   . H/O vitamin D deficiency   . Hemophilia A ([place])   . Hepatitis C virus    recent viral titer negative  . Hernia   . High blood pressure   . OCD (obsessive compulsive disorder) 07/01/2015  . Scoliosis   . Telangiectasias    upper back and chest  . UTI (urinary tract infection) 09/2015    Past Surgical History:  Procedure Laterality Date  . ANKLE ARTHROSCOPY  1996  . BLADDER SURGERY  2017  . SCROTAL SURGERY      Family History  Problem Relation Age of Onset  . Dementia Mother   . Migraines Father   . Stroke Father   . Heart disease Father   . Hemophilia    . Hepatitis C    . Osteopenia    . Psoriasis    . GER disease      Social history:  reports that he has never smoked. He has never used smokeless tobacco. He reports that he does not drink alcohol or use drugs.    Allergies  Allergen Reactions  . Aspirin     Other reaction(s): Bleeding (intolerance)  . Hemophilia Support [Acid Blockers Support] Other (See Comments)    Hemophilia Factor VIII  . Codeine Itching and Rash  . Gluten Meal Diarrhea, Itching and Rash    General lethargy  . Tetracyclines & Related Itching and Rash  . Wheat Bran Diarrhea, Itching and Rash    General lethargy    Medications:    Prior to Admission medications   Medication Sig Start Date End Date Taking? Authorizing Provider  ALPRAZolam Duanne Moron) 0.25 MG tablet Take 0.25 mg by mouth at bedtime as needed for anxiety.   Yes Historical Provider, MD  Antihemophilic Factor rAHF-PAF (XYNTHA SOLOFUSE) 3000 UNITS KIT Inject 3,000 Units into the vein as needed. Patient taking differently: Inject 3,000 Units into the vein as needed (for bleeding).  07/22/15  Yes Ladell Pier, MD  Calcium Carb-Ergocalciferol 250-125 MG-UNIT TABS Take by mouth.   Yes Historical Provider, MD  cholecalciferol (VITAMIN D) 1000 UNITS tablet Take 1,000 Units by mouth every evening.    Yes Historical Provider, MD  Dapsone (ACZONE EX) Apply 1 application topically daily as needed (for acne).    Yes Historical Provider, MD  hydrochlorothiazide (MICROZIDE) 12.5 MG capsule  11/25/15  Yes Historical Provider, MD  HYDROcodone-acetaminophen (NORCO/VICODIN) 5-325 MG tablet Take 1-2 tablets by mouth every 6 (six) hours as needed for moderate pain or severe pain. 09/27/15  Yes Domenic Polite, MD  Multiple Vitamins-Minerals (MULTIVITAMIN PO) Take 1 tablet by mouth daily.   Yes Historical Provider, MD  Naphazoline-Pheniramine (OPCON-A) 0.027-0.315 % SOLN Apply 1 drop to eye  daily as needed (for dry eyes).   Yes Historical Provider, MD    ROS:  Out of a complete 14 system review of symptoms, the patient complains only of the following symptoms, and all other reviewed systems are negative.  Fatigue Ringing in the ears, runny nose Light sensitivity Excessive thirst Restless legs Food allergies Incontinence of the bladder, urinary urgency Joint pain, joint swelling Itching Bruising easily Agitation, depression, anxiety  Blood pressure (!) 151/97, pulse 87, height '5\' 10"'  (1.778 m), weight 171 lb (77.6 kg).  Physical Exam  General: The patient is alert and cooperative at the time of the examination.  Skin: No significant peripheral edema is  noted.   Neurologic Exam  Mental status: The patient is alert and oriented x 3 at the time of the examination. The patient has apparent normal recent and remote memory, with an apparently normal attention span and concentration ability.   Cranial nerves: Facial symmetry is present. Speech is normal, no aphasia or dysarthria is noted. Extraocular movements are full. Visual fields are full.  Motor: The patient has good strength in all 4 extremities.  Sensory examination: Soft touch sensation is symmetric on the face, arms, and legs.  Coordination: The patient has good finger-nose-finger and heel-to-shin bilaterally.  Gait and station: The patient has a normal gait. Tandem gait is normal. Romberg is negative. No drift is seen.  Reflexes: Deep tendon reflexes are symmetric.   Assessment/Plan:  1. Headache, improving  2. Pineal cyst  The patient will undergo a follow-up MRI of the brain to re-evaluate the pineal cyst, if it remains stable in size, no further evaluation is likely to be needed. The patient will follow-up through this office if needed.  Jill Alexanders MD 07/01/2016 4:21 PM  [facility] [address] [place], [place] [address]  Phone [phone] Fax [phone]

## 2016-07-11 ENCOUNTER — Ambulatory Visit
Admission: RE | Admit: 2016-07-11 | Discharge: 2016-07-11 | Disposition: A | Discharge status: Home / Self Care | Payer: BLUE CROSS/BLUE SHIELD | Source: Ambulatory Visit | Attending: Neurology | Admitting: Neurology

## 2016-07-11 DIAGNOSIS — E348 Other specified endocrine disorders: Secondary | ICD-10-CM

## 2016-07-12 ENCOUNTER — Telehealth: Payer: Self-pay | Admitting: Neurology

## 2016-07-12 NOTE — Telephone Encounter (Signed)
  I called the patient. The pineal cyst is stable in size, not sure it needs ongoing follow up.    MRI brain 07/12/16:  IMPRESSION:  This MRI of the brain without contrast shows the following: 1.   15108 mm benign-appearing pineal cyst, unchanged when compared to the 2012 MRI. 2.    Few scattered T2/FLAIR hyperintense foci in the subcortical or deep white matter of the hemispheres consistent with a minimal chronic microvascular ischemic change, unchanged when compared to the previous MRI. 3.   There are no acute findings.

## 2016-07-13 ENCOUNTER — Telehealth: Payer: Self-pay

## 2016-07-13 DIAGNOSIS — N321 Vesicointestinal fistula: Secondary | ICD-10-CM | POA: Diagnosis not present

## 2016-07-13 DIAGNOSIS — N359 Urethral stricture, unspecified: Secondary | ICD-10-CM | POA: Diagnosis not present

## 2016-07-13 NOTE — Telephone Encounter (Signed)
I called the patient. The patient claims to hear some noise when he turns his head, this likely is related to the facet joints in the neck , not to anything wrong inside the head. The pineal cyst remain stable.

## 2016-07-13 NOTE — Telephone Encounter (Signed)
Pt is asking if small vessel disease has chnaged, on lft side of hurts when laying on it, when he turns his head a strange noise like glass rubbing together please call at [phone] and [phone]

## 2016-07-13 NOTE — Telephone Encounter (Signed)
Returned pt's call to review MRI results. Let him know that both the pineal cyst and small vessel disease are unchanged when compared to previous MRI. Continues to have pain on the left side of his head when lying on that side and "squeaking/rubbing" sound when turning his head. Denies neck pain. Inquired about when to schedule his next appt. Advised pt that he can follow-up as needed. Would like a return call w/ any other recommendations due to current symptoms.

## 2016-07-16 DIAGNOSIS — N359 Urethral stricture, unspecified: Secondary | ICD-10-CM | POA: Diagnosis not present

## 2016-07-16 DIAGNOSIS — R339 Retention of urine, unspecified: Secondary | ICD-10-CM | POA: Diagnosis not present

## 2016-07-16 DIAGNOSIS — D66 Hereditary factor VIII deficiency: Secondary | ICD-10-CM | POA: Diagnosis not present

## 2016-07-17 DIAGNOSIS — I10 Essential (primary) hypertension: Secondary | ICD-10-CM | POA: Diagnosis not present

## 2016-07-17 DIAGNOSIS — E559 Vitamin D deficiency, unspecified: Secondary | ICD-10-CM | POA: Diagnosis not present

## 2016-07-17 DIAGNOSIS — E291 Testicular hypofunction: Secondary | ICD-10-CM | POA: Diagnosis not present

## 2016-07-17 DIAGNOSIS — D66 Hereditary factor VIII deficiency: Secondary | ICD-10-CM | POA: Diagnosis not present

## 2016-07-20 DIAGNOSIS — L281 Prurigo nodularis: Secondary | ICD-10-CM | POA: Diagnosis not present

## 2016-08-11 DIAGNOSIS — D66 Hereditary factor VIII deficiency: Secondary | ICD-10-CM | POA: Diagnosis not present

## 2016-08-11 DIAGNOSIS — E559 Vitamin D deficiency, unspecified: Secondary | ICD-10-CM | POA: Diagnosis not present

## 2016-08-11 DIAGNOSIS — E291 Testicular hypofunction: Secondary | ICD-10-CM | POA: Diagnosis not present

## 2016-08-11 DIAGNOSIS — R39198 Other difficulties with micturition: Secondary | ICD-10-CM | POA: Diagnosis not present

## 2016-08-11 DIAGNOSIS — R7301 Impaired fasting glucose: Secondary | ICD-10-CM | POA: Diagnosis not present

## 2016-08-11 DIAGNOSIS — E782 Mixed hyperlipidemia: Secondary | ICD-10-CM | POA: Diagnosis not present

## 2016-08-18 DIAGNOSIS — M65332 Trigger finger, left middle finger: Secondary | ICD-10-CM | POA: Diagnosis not present

## 2016-09-01 DIAGNOSIS — F411 Generalized anxiety disorder: Secondary | ICD-10-CM | POA: Diagnosis not present

## 2016-09-01 DIAGNOSIS — R339 Retention of urine, unspecified: Secondary | ICD-10-CM | POA: Diagnosis not present

## 2016-09-01 DIAGNOSIS — K632 Fistula of intestine: Secondary | ICD-10-CM | POA: Diagnosis not present

## 2016-09-01 DIAGNOSIS — D66 Hereditary factor VIII deficiency: Secondary | ICD-10-CM | POA: Diagnosis not present

## 2016-09-29 ENCOUNTER — Telehealth: Payer: Self-pay | Admitting: Neurology

## 2016-09-29 NOTE — Telephone Encounter (Signed)
Called and LVM returning pt call.

## 2016-09-29 NOTE — Telephone Encounter (Signed)
Pt called said he is having "concusion type symptoms" on left side of the head for the past couple of months worsening over the past month. Please call

## 2016-09-29 NOTE — Telephone Encounter (Signed)
Dr Anne Hahnwillis- tried to called pt. I had to LVM

## 2016-09-29 NOTE — Telephone Encounter (Signed)
I called patient. The patient is having increasing headaches over the last month, on the left side the head. He tends to pick at his scalp on that side as part of his OCD problem.  We will try to revisit sometime in the next 2 or 3 weeks to evaluate this issue.

## 2016-09-30 NOTE — Telephone Encounter (Signed)
Called and spoke to pt. Scheduled f/u for 10/13/16 1230pm, check in 12pm. Pt verbalized understanding. He asked that if there are any cancellations for later in the day, that he be called. Advised I will write his information down and call if I see any cx.

## 2016-10-06 DIAGNOSIS — N321 Vesicointestinal fistula: Secondary | ICD-10-CM | POA: Diagnosis not present

## 2016-10-13 ENCOUNTER — Ambulatory Visit (INDEPENDENT_AMBULATORY_CARE_PROVIDER_SITE_OTHER): Payer: BLUE CROSS/BLUE SHIELD | Admitting: Neurology

## 2016-10-13 ENCOUNTER — Encounter (INDEPENDENT_AMBULATORY_CARE_PROVIDER_SITE_OTHER): Payer: Self-pay

## 2016-10-13 ENCOUNTER — Encounter: Payer: Self-pay | Admitting: Neurology

## 2016-10-13 VITALS — BP 139/86 | HR 86 | Ht 70.0 in | Wt 183.0 lb

## 2016-10-13 DIAGNOSIS — G44221 Chronic tension-type headache, intractable: Secondary | ICD-10-CM

## 2016-10-13 MED ORDER — VENLAFAXINE HCL ER 37.5 MG PO CP24: 75.0000 mg | ORAL_CAPSULE | Freq: Every day | ORAL | 2 refills | Status: DC

## 2016-10-13 NOTE — Progress Notes (Signed)
Reason for visit: Headache  Darrell Graham is an 51 y.o. male  History of present illness:  Mr. Divis is a 51 year old right-handed white male with a history of headaches that are mainly left-sided in nature. The headaches had improved on his last visit to occurring once or twice a week, but over the last couple months they have returned to being daily in nature. The patient has headaches that are better in the morning, worse as the day goes on, the headaches are primarily left-sided with scalp tenderness. The patient may get photophobia with the headache at times, he denies nausea vomiting. He has a dizzy sensation with the headache, he may have to lay down with the headache every other day or so. The patient feels as there may be some cognitive slowing with the headache. He is concerned about the discomfort, he returns to the office today for an evaluation.  Past Medical History:  Diagnosis Date  . Allergy   . Asthma   . Celiac disease   . Chronic arthropathy    left ankle & right elbow  . Deviated nasal septum   . Diverticulitis   . Glaucoma   . H/O vitamin D deficiency   . Hemophilia A ([place])   . Hepatitis C virus    recent viral titer negative  . Hernia   . High blood pressure   . OCD (obsessive compulsive disorder) 07/01/2015  . Scoliosis   . Telangiectasias    upper back and chest  . UTI (urinary tract infection) 09/2015    Past Surgical History:  Procedure Laterality Date  . ANKLE ARTHROSCOPY  1996  . BLADDER SURGERY  2017  . SCROTAL SURGERY      Family History  Problem Relation Age of Onset  . Dementia Mother   . Migraines Father   . Stroke Father   . Heart disease Father   . Hemophilia    . Hepatitis C    . Osteopenia    . Psoriasis    . GER disease      Social history:  reports that he has never smoked. He has never used smokeless tobacco. He reports that he does not drink alcohol or use drugs.    Allergies  Allergen Reactions  . Aspirin     Other reaction(s): Bleeding (intolerance)  . Hemophilia Support [Acid Blockers Support] Other (See Comments)    Hemophilia Factor VIII  . Codeine Itching and Rash  . Gluten Meal Diarrhea, Itching and Rash    General lethargy  . Tetracyclines & Related Itching and Rash  . Wheat Bran Diarrhea, Itching and Rash    General lethargy    Medications:  Prior to Admission medications   Medication Sig Start Date End Date Taking? Authorizing Provider  ALPRAZolam Duanne Moron) 0.25 MG tablet Take 0.25 mg by mouth at bedtime as needed for anxiety.   Yes Historical Provider, MD  Antihemophilic Factor rAHF-PAF (XYNTHA SOLOFUSE) 3000 UNITS KIT Inject 3,000 Units into the vein as needed. Patient taking differently: Inject 3,000 Units into the vein as needed (for bleeding).  07/22/15  Yes Ladell Pier, MD  Calcium Carb-Ergocalciferol 250-125 MG-UNIT TABS Take by mouth.   Yes Historical Provider, MD  cholecalciferol (VITAMIN D) 1000 UNITS tablet Take 1,000 Units by mouth every evening.    Yes Historical Provider, MD  Dapsone (ACZONE EX) Apply 1 application topically daily as needed (for acne).    Yes Historical Provider, MD  hydrochlorothiazide (MICROZIDE) 12.5 MG capsule  11/25/15  Yes Historical Provider, MD  HYDROcodone-acetaminophen (NORCO/VICODIN) 5-325 MG tablet Take 1-2 tablets by mouth every 6 (six) hours as needed for moderate pain or severe pain. 09/27/15  Yes Domenic Polite, MD  Multiple Vitamins-Minerals (MULTIVITAMIN PO) Take 1 tablet by mouth daily.   Yes Historical Provider, MD  Naphazoline-Pheniramine (OPCON-A) 0.027-0.315 % SOLN Apply 1 drop to eye daily as needed (for dry eyes).   Yes Historical Provider, MD  valsartan (DIOVAN) 160 MG tablet Take 160 mg by mouth daily.   Yes Historical Provider, MD  venlafaxine XR (EFFEXOR XR) 37.5 MG 24 hr capsule Take 2 capsules (75 mg total) by mouth daily with breakfast. 10/13/16   Kathrynn Ducking, MD    ROS:  Out of a complete 14 system review of  symptoms, the patient complains only of the following symptoms, and all other reviewed systems are negative.  Fatigue Ringing in the ears, runny nose Light sensitivity Cold intolerance, heat intolerance, excessive thirst Nausea Restless legs Food allergies Joint pain Itching Bruising easily Dizziness, headache Depression, anxiety  Blood pressure 139/86, pulse 86, height _0  (1.778 m), weight 183 lb (83 kg).  Physical Exam  General: The patient is alert and cooperative at the time of the examination.  Skin: No significant peripheral edema is noted.   Neurologic Exam  Mental status: The patient is alert and oriented x 3 at the time of the examination. The patient has apparent normal recent and remote memory, with an apparently normal attention span and concentration ability.   Cranial nerves: Facial symmetry is present. Speech is normal, no aphasia or dysarthria is noted. Extraocular movements are full. Visual fields are full.  Motor: The patient has good strength in all 4 extremities.  Sensory examination: Soft touch sensation is symmetric on the face, arms, and legs.  Coordination: The patient has good finger-nose-finger and heel-to-shin bilaterally.  Gait and station: The patient has a normal gait. Tandem gait is normal. Romberg is negative. No drift is seen.  Reflexes: Deep tendon reflexes are symmetric.    MRI brain 07/12/16:  IMPRESSION: This MRI of the brain without contrast shows the following: 1. 15108 mm benign-appearing pineal cyst, unchanged when compared to the 2012 MRI. 2. Few scattered T2/FLAIR hyperintense foci in the subcortical or deep white matter of the hemispheres consistent with a minimal chronic microvascular ischemic change, unchanged when compared to the previous MRI. 3. There are no acute findings.  * MRI scan images were reviewed online. I agree with the written report.    Assessment/Plan:  1. Daily headaches  The patient  will be placed on Effexor, he will begin taking 37.5 mg daily, and go up to 75 mg daily after 2 weeks. He will call me for any dose adjustments, we will follow-up in about 4 months, sooner if needed.  Jill Alexanders MD 10/13/2016 12:31 PM  [facility] [address] [place], [place] [address]  Phone [phone] Fax [phone]

## 2016-10-13 NOTE — Patient Instructions (Signed)
   Begin the Effexor 37.5 mg tablet one a day for 2 weeks, then take 2 tablets a day.

## 2016-10-16 DIAGNOSIS — F411 Generalized anxiety disorder: Secondary | ICD-10-CM | POA: Diagnosis not present

## 2016-10-16 DIAGNOSIS — D66 Hereditary factor VIII deficiency: Secondary | ICD-10-CM | POA: Diagnosis not present

## 2016-10-16 DIAGNOSIS — I10 Essential (primary) hypertension: Secondary | ICD-10-CM | POA: Diagnosis not present

## 2016-10-16 DIAGNOSIS — R39198 Other difficulties with micturition: Secondary | ICD-10-CM | POA: Diagnosis not present

## 2016-10-20 DIAGNOSIS — M65332 Trigger finger, left middle finger: Secondary | ICD-10-CM | POA: Diagnosis not present

## 2016-10-20 DIAGNOSIS — D66 Hereditary factor VIII deficiency: Secondary | ICD-10-CM | POA: Diagnosis not present

## 2016-10-21 DIAGNOSIS — H40053 Ocular hypertension, bilateral: Secondary | ICD-10-CM | POA: Diagnosis not present

## 2016-10-23 DIAGNOSIS — D66 Hereditary factor VIII deficiency: Secondary | ICD-10-CM | POA: Diagnosis not present

## 2016-10-23 DIAGNOSIS — M25521 Pain in right elbow: Secondary | ICD-10-CM | POA: Diagnosis not present

## 2016-10-30 DIAGNOSIS — M25511 Pain in right shoulder: Secondary | ICD-10-CM | POA: Diagnosis not present

## 2016-10-30 DIAGNOSIS — M25572 Pain in left ankle and joints of left foot: Secondary | ICD-10-CM | POA: Diagnosis not present

## 2016-10-30 DIAGNOSIS — M19021 Primary osteoarthritis, right elbow: Secondary | ICD-10-CM | POA: Diagnosis not present

## 2016-10-30 DIAGNOSIS — M19011 Primary osteoarthritis, right shoulder: Secondary | ICD-10-CM | POA: Diagnosis not present

## 2016-10-30 DIAGNOSIS — G8929 Other chronic pain: Secondary | ICD-10-CM | POA: Diagnosis not present

## 2016-10-30 DIAGNOSIS — M19221 Secondary osteoarthritis, right elbow: Secondary | ICD-10-CM | POA: Diagnosis not present

## 2016-10-30 DIAGNOSIS — M25521 Pain in right elbow: Secondary | ICD-10-CM | POA: Diagnosis not present

## 2016-12-01 DIAGNOSIS — D66 Hereditary factor VIII deficiency: Secondary | ICD-10-CM | POA: Diagnosis not present

## 2016-12-01 DIAGNOSIS — E291 Testicular hypofunction: Secondary | ICD-10-CM | POA: Diagnosis not present

## 2016-12-01 DIAGNOSIS — N37 Urethral disorders in diseases classified elsewhere: Secondary | ICD-10-CM | POA: Diagnosis not present

## 2016-12-11 DIAGNOSIS — L237 Allergic contact dermatitis due to plants, except food: Secondary | ICD-10-CM | POA: Diagnosis not present

## 2016-12-11 DIAGNOSIS — L281 Prurigo nodularis: Secondary | ICD-10-CM | POA: Diagnosis not present

## 2016-12-11 DIAGNOSIS — L821 Other seborrheic keratosis: Secondary | ICD-10-CM | POA: Diagnosis not present

## 2016-12-21 DIAGNOSIS — R7989 Other specified abnormal findings of blood chemistry: Secondary | ICD-10-CM | POA: Diagnosis not present

## 2016-12-25 DIAGNOSIS — E349 Endocrine disorder, unspecified: Secondary | ICD-10-CM | POA: Diagnosis not present

## 2016-12-29 DIAGNOSIS — M65332 Trigger finger, left middle finger: Secondary | ICD-10-CM | POA: Diagnosis not present

## 2017-01-06 DIAGNOSIS — M65332 Trigger finger, left middle finger: Secondary | ICD-10-CM | POA: Diagnosis not present

## 2017-01-08 DIAGNOSIS — M65332 Trigger finger, left middle finger: Secondary | ICD-10-CM | POA: Diagnosis not present

## 2017-01-12 DIAGNOSIS — M65332 Trigger finger, left middle finger: Secondary | ICD-10-CM | POA: Diagnosis not present

## 2017-01-15 DIAGNOSIS — M65332 Trigger finger, left middle finger: Secondary | ICD-10-CM | POA: Diagnosis not present

## 2017-01-18 DIAGNOSIS — M65332 Trigger finger, left middle finger: Secondary | ICD-10-CM | POA: Diagnosis not present

## 2017-01-18 DIAGNOSIS — D66 Hereditary factor VIII deficiency: Secondary | ICD-10-CM | POA: Diagnosis not present

## 2017-01-20 DIAGNOSIS — M549 Dorsalgia, unspecified: Secondary | ICD-10-CM | POA: Diagnosis not present

## 2017-01-20 DIAGNOSIS — F419 Anxiety disorder, unspecified: Secondary | ICD-10-CM | POA: Diagnosis not present

## 2017-01-20 DIAGNOSIS — M542 Cervicalgia: Secondary | ICD-10-CM | POA: Diagnosis not present

## 2017-01-20 DIAGNOSIS — Z9889 Other specified postprocedural states: Secondary | ICD-10-CM | POA: Diagnosis not present

## 2017-01-20 DIAGNOSIS — N358 Other urethral stricture: Secondary | ICD-10-CM | POA: Diagnosis not present

## 2017-01-20 DIAGNOSIS — I10 Essential (primary) hypertension: Secondary | ICD-10-CM | POA: Diagnosis not present

## 2017-01-20 DIAGNOSIS — E785 Hyperlipidemia, unspecified: Secondary | ICD-10-CM | POA: Diagnosis not present

## 2017-01-20 DIAGNOSIS — D509 Iron deficiency anemia, unspecified: Secondary | ICD-10-CM | POA: Diagnosis not present

## 2017-01-20 DIAGNOSIS — J302 Other seasonal allergic rhinitis: Secondary | ICD-10-CM | POA: Diagnosis not present

## 2017-01-20 DIAGNOSIS — K219 Gastro-esophageal reflux disease without esophagitis: Secondary | ICD-10-CM | POA: Diagnosis not present

## 2017-01-20 DIAGNOSIS — D66 Hereditary factor VIII deficiency: Secondary | ICD-10-CM | POA: Diagnosis not present

## 2017-01-20 DIAGNOSIS — Z8619 Personal history of other infectious and parasitic diseases: Secondary | ICD-10-CM | POA: Diagnosis not present

## 2017-01-22 DIAGNOSIS — F411 Generalized anxiety disorder: Secondary | ICD-10-CM | POA: Diagnosis not present

## 2017-01-22 DIAGNOSIS — E291 Testicular hypofunction: Secondary | ICD-10-CM | POA: Diagnosis not present

## 2017-01-22 DIAGNOSIS — N359 Urethral stricture, unspecified: Secondary | ICD-10-CM | POA: Diagnosis not present

## 2017-01-25 DIAGNOSIS — N359 Urethral stricture, unspecified: Secondary | ICD-10-CM | POA: Diagnosis not present

## 2017-01-25 DIAGNOSIS — J302 Other seasonal allergic rhinitis: Secondary | ICD-10-CM | POA: Diagnosis not present

## 2017-01-25 DIAGNOSIS — E785 Hyperlipidemia, unspecified: Secondary | ICD-10-CM | POA: Diagnosis not present

## 2017-01-25 DIAGNOSIS — Z8619 Personal history of other infectious and parasitic diseases: Secondary | ICD-10-CM | POA: Diagnosis not present

## 2017-01-25 DIAGNOSIS — D509 Iron deficiency anemia, unspecified: Secondary | ICD-10-CM | POA: Diagnosis not present

## 2017-01-25 DIAGNOSIS — K219 Gastro-esophageal reflux disease without esophagitis: Secondary | ICD-10-CM | POA: Diagnosis not present

## 2017-01-25 DIAGNOSIS — D66 Hereditary factor VIII deficiency: Secondary | ICD-10-CM | POA: Diagnosis not present

## 2017-01-25 DIAGNOSIS — I10 Essential (primary) hypertension: Secondary | ICD-10-CM | POA: Diagnosis not present

## 2017-01-25 DIAGNOSIS — Z9889 Other specified postprocedural states: Secondary | ICD-10-CM | POA: Diagnosis not present

## 2017-01-25 DIAGNOSIS — N358 Other urethral stricture: Secondary | ICD-10-CM | POA: Diagnosis not present

## 2017-01-25 DIAGNOSIS — M542 Cervicalgia: Secondary | ICD-10-CM | POA: Diagnosis not present

## 2017-01-25 DIAGNOSIS — M549 Dorsalgia, unspecified: Secondary | ICD-10-CM | POA: Diagnosis not present

## 2017-01-25 DIAGNOSIS — F419 Anxiety disorder, unspecified: Secondary | ICD-10-CM | POA: Diagnosis not present

## 2017-02-02 DIAGNOSIS — M65332 Trigger finger, left middle finger: Secondary | ICD-10-CM | POA: Diagnosis not present

## 2017-02-05 DIAGNOSIS — M65332 Trigger finger, left middle finger: Secondary | ICD-10-CM | POA: Diagnosis not present

## 2017-02-09 DIAGNOSIS — M65332 Trigger finger, left middle finger: Secondary | ICD-10-CM | POA: Diagnosis not present

## 2017-02-15 DIAGNOSIS — R7989 Other specified abnormal findings of blood chemistry: Secondary | ICD-10-CM | POA: Diagnosis not present

## 2017-02-16 DIAGNOSIS — M65332 Trigger finger, left middle finger: Secondary | ICD-10-CM | POA: Diagnosis not present

## 2017-02-19 DIAGNOSIS — M65332 Trigger finger, left middle finger: Secondary | ICD-10-CM | POA: Diagnosis not present

## 2017-02-22 DIAGNOSIS — M65332 Trigger finger, left middle finger: Secondary | ICD-10-CM | POA: Diagnosis not present

## 2017-03-01 ENCOUNTER — Ambulatory Visit (INDEPENDENT_AMBULATORY_CARE_PROVIDER_SITE_OTHER): Payer: BLUE CROSS/BLUE SHIELD | Admitting: Neurology

## 2017-03-01 ENCOUNTER — Encounter: Payer: Self-pay | Admitting: Neurology

## 2017-03-01 VITALS — BP 138/82 | HR 80 | Ht 70.0 in | Wt 180.5 lb

## 2017-03-01 DIAGNOSIS — G44221 Chronic tension-type headache, intractable: Secondary | ICD-10-CM | POA: Diagnosis not present

## 2017-03-01 MED ORDER — GABAPENTIN 100 MG PO CAPS: 100.0000 mg | ORAL_CAPSULE | Freq: Three times a day (TID) | ORAL | 3 refills | Status: DC

## 2017-03-01 NOTE — Progress Notes (Signed)
Reason for visit: Headaches  Darrell Graham is an 51 y.o. male  History of present illness:  Darrell Graham is a 51 year old right-handed white male with a history of chronic daily headache. The patient has been given a prescription for low-dose Effexor on 3 occasions, he has never gotten the prescription filled. He does not wish to go on the medication. The patient indicates that he does not feel "right". He cannot lay on the left side of his head at night as this results in headache. The patient has had recent surgery for a urethral stricture and for a trigger finger. The patient has headaches almost daily. He is under some stress taking care of his mother. He returns to this office for an evaluation.  Past Medical History:  Diagnosis Date  . Allergy   . Asthma   . Celiac disease   . Chronic arthropathy    left ankle & right elbow  . Deviated nasal septum   . Diverticulitis   . Glaucoma   . H/O vitamin D deficiency   . 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 Hepatitis C virus    recent viral titer negative  . Hernia   . High blood pressure   . OCD (obsessive compulsive disorder) 07/01/2015  . Scoliosis   . Telangiectasias    upper back and chest  . UTI (urinary tract infection) 09/2015    Past Surgical History:  Procedure Laterality Date  . ANKLE ARTHROSCOPY  1996  . BLADDER SURGERY  2017  . SCROTAL SURGERY    . TRIGGER FINGER RELEASE Left    March 2018  . URETHRA SURGERY     June 2018- dilation of urethra     Family History  Problem Relation Age of Onset  . Dementia Mother   . Migraines Father   . Stroke Father   . Heart disease Father   . Hemophilia Unknown   . Hepatitis C Unknown   . Osteopenia Unknown   . Psoriasis Unknown   . GER disease Unknown     Social history:  reports that he has never smoked. He has never used smokeless tobacco. He reports that he does not drink alcohol or use drugs.    Allergies  Allergen Reactions  . Aspirin     Other reaction(s):  Bleeding (intolerance)  . Hemophilia Support [Acid Blockers Support] Other (See Comments)    Hemophilia Factor VIII  . Codeine Itching and Rash  . Gluten Meal Diarrhea, Itching and Rash    General lethargy  . Tetracycline Hcl Itching and Rash  . Tetracyclines & Related Itching and Rash  . Wheat Bran Diarrhea, Itching and Rash    General lethargy    Medications:  Prior to Admission medications   Medication Sig Start Date End Date Taking? Authorizing Provider  ALPRAZolam Duanne Moron) 0.25 MG tablet Take 0.25 mg by mouth at bedtime as needed for anxiety.   Yes [provider]  Antihemophilic Factor rAHF-PAF (XYNTHA SOLOFUSE) 3000 UNITS KIT Inject 3,000 Units into the vein as needed. Patient taking differently: Inject 3,000 Units into the vein as needed (for bleeding).  07/22/15  Yes Ladell Pier, MD  Calcium Carb-Ergocalciferol 250-125 MG-UNIT TABS Take by mouth.   Yes [provider]  cholecalciferol (VITAMIN D) 1000 UNITS tablet Take 1,000 Units by mouth every evening.    Yes [provider]  Dapsone (ACZONE EX) Apply 1 application topically daily as needed (for acne).    Yes [provider]  hydrochlorothiazide (MICROZIDE) 12.5 MG capsule  11/25/15  Yes [provider]  HYDROcodone-acetaminophen (NORCO/VICODIN) 5-325 MG tablet Take 1-2 tablets by mouth every 6 (six) hours as needed for moderate pain or severe pain. 09/27/15  Yes Domenic Polite, MD  Multiple Vitamins-Minerals (MULTIVITAMIN PO) Take 1 tablet by mouth daily.   Yes [provider]  Naphazoline-Pheniramine (OPCON-A) 0.027-0.315 % SOLN Apply 1 drop to eye daily as needed (for dry eyes).   Yes [provider]    ROS:  Out of a complete 14 system review of symptoms, the patient complains only of the following symptoms, and all other reviewed systems are negative.  Fatigue Ringing in the ears Blurred vision Cold intolerance, heat intolerance, excessive  thirst Nausea Restless legs, insomnia Environmental allergies, food allergies Joint pain, joint swelling, back pain, achy muscles, neck pain Skin wounds, itching Memory loss, dizziness, headache, weakness Agitation, decreased concentration, depression, anxiety  Blood pressure 138/82, pulse 80, height _0  (1.778 m), weight 180 lb 8 oz (81.9 kg).  Physical Exam  General: The patient is alert and cooperative at the time of the examination.  Skin: No significant peripheral edema is noted.   Neurologic Exam  Mental status: The patient is alert and oriented x 3 at the time of the examination. The patient has apparent normal recent and remote memory, with an apparently normal attention span and concentration ability.   Cranial nerves: Facial symmetry is present. Speech is normal, no aphasia or dysarthria is noted. Extraocular movements are full. Visual fields are full.  Motor: The patient has good strength in all 4 extremities.  Sensory examination: Soft touch sensation is symmetric on the face, arms, and legs.  Coordination: The patient has good finger-nose-finger and heel-to-shin bilaterally.  Gait and station: The patient has a normal gait. Tandem gait is normal. Romberg is negative. No drift is seen.  Reflexes: Deep tendon reflexes are symmetric.   MRI brain 07/11/16:  IMPRESSION:  This MRI of the brain without contrast shows the following: 1.   15108 mm benign-appearing pineal cyst, unchanged when compared to the 2012 MRI. 2.    Few scattered T2/FLAIR hyperintense foci in the subcortical or deep white matter of the hemispheres consistent with a minimal chronic microvascular ischemic change, unchanged when compared to the previous MRI. 3.   There are no acute findings.  * MRI scan images were reviewed online. I agree with the written report.    Assessment/Plan:  1. Chronic daily headache  The patient was placed on low-dose gabapentin, taking 100 mg 3 times a day.  He will follow-up in 6 months, he will call for any dose adjustments.  Jill Alexanders MD 03/01/2017 3:15 PM  [facility] [address] [place], [place] [address]  Phone [phone] Fax [phone]

## 2017-03-01 NOTE — Patient Instructions (Signed)
   We will start gabapentin 100 mg capsules three times a day.  Neurontin (gabapentin) may result in drowsiness, ankle swelling, gait instability, or possibly dizziness. Please contact our office if significant side effects occur with this medication.

## 2017-03-03 DIAGNOSIS — D66 Hereditary factor VIII deficiency: Secondary | ICD-10-CM | POA: Diagnosis not present

## 2017-03-03 DIAGNOSIS — Z87448 Personal history of other diseases of urinary system: Secondary | ICD-10-CM | POA: Diagnosis not present

## 2017-03-23 DIAGNOSIS — E291 Testicular hypofunction: Secondary | ICD-10-CM | POA: Diagnosis not present

## 2017-03-23 DIAGNOSIS — E559 Vitamin D deficiency, unspecified: Secondary | ICD-10-CM | POA: Diagnosis not present

## 2017-03-23 DIAGNOSIS — I10 Essential (primary) hypertension: Secondary | ICD-10-CM | POA: Diagnosis not present

## 2017-03-23 DIAGNOSIS — Z862 Personal history of diseases of the blood and blood-forming organs and certain disorders involving the immune mechanism: Secondary | ICD-10-CM | POA: Diagnosis not present

## 2017-03-23 DIAGNOSIS — M65332 Trigger finger, left middle finger: Secondary | ICD-10-CM | POA: Diagnosis not present

## 2017-03-23 DIAGNOSIS — D66 Hereditary factor VIII deficiency: Secondary | ICD-10-CM | POA: Diagnosis not present

## 2017-03-23 DIAGNOSIS — M65342 Trigger finger, left ring finger: Secondary | ICD-10-CM | POA: Diagnosis not present

## 2017-03-23 DIAGNOSIS — M2550 Pain in unspecified joint: Secondary | ICD-10-CM | POA: Diagnosis not present

## 2017-03-23 DIAGNOSIS — D649 Anemia, unspecified: Secondary | ICD-10-CM | POA: Diagnosis not present

## 2017-04-13 ENCOUNTER — Encounter (HOSPITAL_COMMUNITY): Payer: Self-pay | Admitting: *Deleted

## 2017-04-13 ENCOUNTER — Emergency Department (HOSPITAL_COMMUNITY): Payer: BLUE CROSS/BLUE SHIELD

## 2017-04-13 ENCOUNTER — Emergency Department (HOSPITAL_COMMUNITY)
Admission: EM | Admit: 2017-04-13 | Discharge: 2017-04-13 | Disposition: A | Discharge status: Home / Self Care | Payer: BLUE CROSS/BLUE SHIELD | Attending: Emergency Medicine | Admitting: Emergency Medicine

## 2017-04-13 DIAGNOSIS — M25531 Pain in right wrist: Secondary | ICD-10-CM

## 2017-04-13 DIAGNOSIS — M25031 Hemarthrosis, right wrist: Secondary | ICD-10-CM | POA: Diagnosis not present

## 2017-04-13 DIAGNOSIS — Z79899 Other long term (current) drug therapy: Secondary | ICD-10-CM | POA: Insufficient documentation

## 2017-04-13 DIAGNOSIS — M2500 Hemarthrosis, unspecified joint: Secondary | ICD-10-CM

## 2017-04-13 DIAGNOSIS — J45909 Unspecified asthma, uncomplicated: Secondary | ICD-10-CM | POA: Diagnosis not present

## 2017-04-13 DIAGNOSIS — M7989 Other specified soft tissue disorders: Secondary | ICD-10-CM | POA: Diagnosis not present

## 2017-04-13 MED ORDER — HYDROMORPHONE HCL 1 MG/ML IJ SOLN
1.0000 mg | Freq: Once | INTRAMUSCULAR | Status: AC
  Administered 2017-04-13: 1 mg via INTRAVENOUS
  Filled 2017-04-13: qty 1

## 2017-04-13 MED ORDER — ANTIHEM FACT (BDD-RFVIII,MOR) 3000 UNITS IV KIT
2930.0000 [IU] | PACK | Freq: Once | INTRAVENOUS | Status: AC
  Administered 2017-04-13: 2930 [IU] via INTRAVENOUS
  Filled 2017-04-13: qty 1

## 2017-04-13 MED ORDER — OXYCODONE-ACETAMINOPHEN 5-325 MG PO TABS: 1.0000 | ORAL_TABLET | ORAL | 0 refills | Status: DC | PRN

## 2017-04-13 MED ORDER — ANTIHEM FACT (BDD-RFVIII,MOR) 3000 UNITS IV KIT
3000.0000 [IU] | PACK | Freq: Once | INTRAVENOUS | Status: DC
  Filled 2017-04-13: qty 1

## 2017-04-13 NOTE — ED Provider Notes (Signed)
[facility] DEPT Provider Note   CSN: [id number] Arrival date & time: 04/13/17  0108     History   Chief Complaint Chief Complaint  Patient presents with  . Arm Pain    HPI Darrell Graham. is a 51 y.o. male.  HPI  This is a 51 year old male with history of hemophilia A who presents with acutely worsening swelling of the right wrist and pain. No injury. Patient reports spontaneous right wrist pain and swelling over the last several hours. He reports swelling of the hand and numbness and tingling in the fingers. Also reports radiation of pain towards his arm. Currently his pain is 10 out of 10. He did take by mouth Dilaudid at home with minimal relief. He has history of hemarthrosis before with similar symptoms.  Past Medical History:  Diagnosis Date  . Allergy   . Asthma   . Celiac disease   . Chronic arthropathy    left ankle & right elbow  . Deviated nasal septum   . Diverticulitis   . Glaucoma   . H/O vitamin D deficiency   . 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 Hepatitis C virus    recent viral titer negative  . Hernia   . High blood pressure   . OCD (obsessive compulsive disorder) 07/01/2015  . Scoliosis   . Telangiectasias    upper back and chest  . UTI (urinary tract infection) 09/2015    Patient Active Problem List   Diagnosis Date Noted  . Diverticulitis of sigmoid colon 09/25/2015  . Colo-vesical fistula 09/25/2015  . Sepsis ([place]) 09/22/2015  . UTI (lower urinary tract infection) 09/22/2015  . OCD (obsessive compulsive disorder) 07/01/2015  . Hemophilia ([place]) 03/23/2013  . Headache 03/23/2013    Past Surgical History:  Procedure Laterality Date  . ANKLE ARTHROSCOPY  1996  . BLADDER SURGERY  2017  . SCROTAL SURGERY    . TRIGGER FINGER RELEASE Left    March 2018  . URETHRA SURGERY     June 2018- dilation of urethra        Home Medications    Prior to Admission medications   Medication Sig Start Date End Date Taking? Authorizing Provider    ALPRAZolam Duanne Moron) 0.25 MG tablet Take 0.25 mg by mouth at bedtime as needed for anxiety.   Yes [provider]  amLODipine (NORVASC) 5 MG tablet Take 5 mg by mouth daily.   Yes [provider]  Antihemophilic Factor rAHF-PAF (XYNTHA SOLOFUSE) 3000 UNITS KIT Inject 3,000 Units into the vein as needed. Patient taking differently: Inject 3,000 Units into the vein as needed (for bleeding).  07/22/15  Yes Ladell Pier, MD  Calcium Carb-Ergocalciferol 250-125 MG-UNIT TABS Take 1 tablet by mouth daily.    Yes [provider]  cholecalciferol (VITAMIN D) 1000 UNITS tablet Take 1,000 Units by mouth every evening.    Yes [provider]  Dapsone (ACZONE EX) Apply 1 application topically daily as needed (for acne).    Yes [provider]  hydrochlorothiazide (MICROZIDE) 12.5 MG capsule Take 12.5 mg by mouth daily.  11/25/15  Yes [provider]  HYDROcodone-acetaminophen (NORCO/VICODIN) 5-325 MG tablet Take 1-2 tablets by mouth every 6 (six) hours as needed for moderate pain or severe pain. 09/27/15  Yes Domenic Polite, MD  losartan (COZAAR) 50 MG tablet Take 50 mg by mouth daily.   Yes [provider]  Multiple Vitamins-Minerals (MULTIVITAMIN PO) Take 1 tablet by mouth daily.   Yes [provider]  Naphazoline-Pheniramine (OPCON-A) 0.027-0.315 % SOLN Apply 1 drop to eye daily as needed (for dry eyes).   Yes [provider]  gabapentin (NEURONTIN) 100 MG capsule Take 1 capsule (100 mg total) by mouth 3 (three) times daily. Patient not taking: Reported on 04/13/2017 03/01/17   Kathrynn Ducking, MD  oxyCODONE-acetaminophen (PERCOCET/ROXICET) 5-325 MG tablet Take 1-2 tablets by mouth every 4 (four) hours as needed for severe pain. 04/13/17   Eric Nees, Barbette Hair, MD    Family History Family History  Problem Relation Age of Onset  . Dementia Mother   . Migraines Father   . Stroke Father   . Heart disease Father   . Hemophilia  Unknown   . Hepatitis C Unknown   . Osteopenia Unknown   . Psoriasis Unknown   . GER disease Unknown     Social History Social History  Substance Use Topics  . Smoking status: Never Smoker  . Smokeless tobacco: Never Used  . Alcohol use No     Allergies   Aspirin; Hemophilia support [acid blockers support]; Codeine; Gluten meal; Tetracycline hcl; Tetracyclines & related; and Wheat bran   Review of Systems Review of Systems  Constitutional: Negative for fever.  Musculoskeletal: Positive for joint swelling.  Skin: Positive for color change.  Neurological: Positive for numbness.  All other systems reviewed and are negative.    Physical Exam Updated Vital Signs BP 132/70 (BP Location: Right Arm)   Pulse 88   Temp 98.2 F (36.8 C)   Resp 16   SpO2 99%   Physical Exam  Constitutional: He is oriented to person, place, and time. He appears well-developed and well-nourished.  Uncomfortable appearing, no acute distress  HENT:  Head: Normocephalic and atraumatic.  Cardiovascular: Normal rate and regular rhythm.   Pulmonary/Chest: Effort normal. No respiratory distress.  Musculoskeletal: He exhibits edema.  Mild swelling noted of the right wrist with swelling into the hand, ecchymosis noted over the knuckles and the palmar aspect of the hand, decreased minimal range of motion of the wrist, flexion and extension of the finger still intact, no tenderness to palpation to the wrist, 2+ radial pulse  Neurological: He is alert and oriented to person, place, and time.  Skin: Skin is warm and dry.  Psychiatric: He has a normal mood and affect.  Nursing note and vitals reviewed.    ED Treatments / Results  Labs (all labs ordered are listed, but only abnormal results are displayed) Labs Reviewed - No data to display  EKG  EKG Interpretation None       Radiology Dg Wrist Complete Right  Result Date: 04/13/2017 CLINICAL DATA:  Hemarthrosis, pain. EXAM: RIGHT WRIST -  COMPLETE 3+ VIEW COMPARISON:  None. FINDINGS: There is no evidence of fracture or dislocation. There is no evidence of arthropathy or other focal bone abnormality. No periarticular erosions. Mild dorsal wrist soft tissue swelling without subcutaneous gas or radiopaque foreign bodies. IMPRESSION: Mild soft tissue swelling without acute osseous process. Electronically Signed   By: Elon Alas M.D.   On: 04/13/2017 03:33    Procedures Procedures (including critical care time)  Medications Ordered in ED Medications  HYDROmorphone (DILAUDID) injection 1 mg (1 mg Intravenous Given 04/13/17 7408)  Antihemophilic Factor rAHF-PAF KIT 2,930 Units (2,930 Units Intravenous Given 04/13/17 0309)     Initial Impression / Assessment and Plan / ED Course  I have reviewed the triage vital signs and the nursing notes.  Pertinent labs & imaging results that were  available during my care of the patient were reviewed by me and considered in my medical decision making (see chart for details).     Patient presents with right wrist swelling. Spontaneous. Suspect hemarthrosis and joint bleeding causing distal swelling and numbness. Patient provided his own anti-factor. This was administered. Extremity was iced and elevated.  X-ray negative. Patient was given IV pain medication. On multiple rechecks, he has progressive improving exam and improvement of pain. Recommend close follow-up with hemophilia doctor at [facility].  After history, exam, and medical workup I feel the patient has been appropriately medically screened and is safe for discharge home. Pertinent diagnoses were discussed with the patient. Patient was given return precautions.   Final Clinical Impressions(s) / ED Diagnoses   Final diagnoses:  Hemarthrosis  Right wrist pain    New Prescriptions Discharge Medication List as of 04/13/2017  6:01 AM    START taking these medications   Details  oxyCODONE-acetaminophen (PERCOCET/ROXICET) 5-325  MG tablet Take 1-2 tablets by mouth every 4 (four) hours as needed for severe pain., Starting Tue 04/13/2017, Print         Tavian Callander, Barbette Hair, MD 04/13/17 [id number]

## 2017-04-13 NOTE — Discharge Instructions (Signed)
You were seen today for likely a hemarthrosis of the right wrist. Keep the wrist elevated and iced. Follow-up with your hemophilia doctor.

## 2017-04-13 NOTE — ED Triage Notes (Signed)
Pt has hemophilia, stating "I bleed in my joints." Pt takes 300 iu antihemophilic factor for factor VIII replacement when this occurs. Pt pacing and restless in triage; took a dilaudid (left over from a bladder procedure) without improvement, lidoderm patch placed to R wrist, obvious swelling noted to hand with decreased movement

## 2017-04-14 DIAGNOSIS — H40053 Ocular hypertension, bilateral: Secondary | ICD-10-CM | POA: Diagnosis not present

## 2017-04-19 DIAGNOSIS — Z79899 Other long term (current) drug therapy: Secondary | ICD-10-CM | POA: Diagnosis not present

## 2017-04-19 DIAGNOSIS — Z886 Allergy status to analgesic agent status: Secondary | ICD-10-CM | POA: Diagnosis not present

## 2017-04-19 DIAGNOSIS — D66 Hereditary factor VIII deficiency: Secondary | ICD-10-CM | POA: Diagnosis not present

## 2017-04-19 DIAGNOSIS — Z888 Allergy status to other drugs, medicaments and biological substances status: Secondary | ICD-10-CM | POA: Diagnosis not present

## 2017-04-19 DIAGNOSIS — Z885 Allergy status to narcotic agent status: Secondary | ICD-10-CM | POA: Diagnosis not present

## 2017-04-19 DIAGNOSIS — I10 Essential (primary) hypertension: Secondary | ICD-10-CM | POA: Diagnosis not present

## 2017-04-19 DIAGNOSIS — E782 Mixed hyperlipidemia: Secondary | ICD-10-CM | POA: Diagnosis not present

## 2017-04-19 DIAGNOSIS — Z881 Allergy status to other antibiotic agents status: Secondary | ICD-10-CM | POA: Diagnosis not present

## 2017-04-19 DIAGNOSIS — M25531 Pain in right wrist: Secondary | ICD-10-CM | POA: Diagnosis not present

## 2017-04-26 DIAGNOSIS — M79601 Pain in right arm: Secondary | ICD-10-CM | POA: Diagnosis not present

## 2017-04-29 DIAGNOSIS — I10 Essential (primary) hypertension: Secondary | ICD-10-CM | POA: Diagnosis not present

## 2017-05-05 DIAGNOSIS — M25529 Pain in unspecified elbow: Secondary | ICD-10-CM | POA: Diagnosis not present

## 2017-05-05 DIAGNOSIS — G8929 Other chronic pain: Secondary | ICD-10-CM | POA: Diagnosis not present

## 2017-05-05 DIAGNOSIS — Z885 Allergy status to narcotic agent status: Secondary | ICD-10-CM | POA: Diagnosis not present

## 2017-05-05 DIAGNOSIS — M25521 Pain in right elbow: Secondary | ICD-10-CM | POA: Diagnosis not present

## 2017-05-05 DIAGNOSIS — Z79899 Other long term (current) drug therapy: Secondary | ICD-10-CM | POA: Diagnosis not present

## 2017-05-05 DIAGNOSIS — M2550 Pain in unspecified joint: Secondary | ICD-10-CM | POA: Diagnosis not present

## 2017-05-05 DIAGNOSIS — Z23 Encounter for immunization: Secondary | ICD-10-CM | POA: Diagnosis not present

## 2017-05-05 DIAGNOSIS — E782 Mixed hyperlipidemia: Secondary | ICD-10-CM | POA: Diagnosis not present

## 2017-05-05 DIAGNOSIS — Z888 Allergy status to other drugs, medicaments and biological substances status: Secondary | ICD-10-CM | POA: Diagnosis not present

## 2017-05-05 DIAGNOSIS — D66 Hereditary factor VIII deficiency: Secondary | ICD-10-CM | POA: Diagnosis not present

## 2017-05-05 DIAGNOSIS — I10 Essential (primary) hypertension: Secondary | ICD-10-CM | POA: Diagnosis not present

## 2017-05-05 DIAGNOSIS — Z886 Allergy status to analgesic agent status: Secondary | ICD-10-CM | POA: Diagnosis not present

## 2017-05-05 DIAGNOSIS — M25531 Pain in right wrist: Secondary | ICD-10-CM | POA: Diagnosis not present

## 2017-05-17 NOTE — Telephone Encounter (Signed)
Close Encounter 

## 2017-06-04 DIAGNOSIS — E291 Testicular hypofunction: Secondary | ICD-10-CM | POA: Diagnosis not present

## 2017-06-04 DIAGNOSIS — Z87448 Personal history of other diseases of urinary system: Secondary | ICD-10-CM | POA: Diagnosis not present

## 2017-06-15 ENCOUNTER — Ambulatory Visit (HOSPITAL_BASED_OUTPATIENT_CLINIC_OR_DEPARTMENT_OTHER): Payer: BLUE CROSS/BLUE SHIELD | Admitting: Oncology

## 2017-06-15 VITALS — BP 136/86 | HR 91 | Temp 98.9°F | Resp 20 | Ht 70.0 in | Wt 183.7 lb

## 2017-06-15 DIAGNOSIS — D66 Hereditary factor VIII deficiency: Secondary | ICD-10-CM

## 2017-06-15 NOTE — Progress Notes (Signed)
  [facility] OFFICE PROGRESS NOTE   Diagnosis: Hemophilia A  INTERVAL HISTORY:   [name] returns for a scheduled visit.  He is [facility].  He continues to have a urethral stricture, [facility].  He has received factor replacement following urethral procedures and after a right wrist bleed in September.  He had a baseline factor VIII level of 7% in September.  No inhibitor was identified.  Objective:  Vital signs in last 24 hours:  Blood pressure 136/86, pulse 91, temperature 98.9 F (37.2 C), temperature source Oral, resp. rate 20, height 5\' 10"  (1.778 m), weight 183 lb 11.2 oz (83.3 kg), SpO2 98 %.    Resp: Clear bilaterally Cardio: Regular rate and rhythm GI: No hepatosplenomegaly Vascular: No leg edema Musculoskeletal: Limited range of motion of the right elbow   Medications: I have reviewed the patient's current medications.  Assessment/Plan: 1. 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  2. History of hepatitis C infection. Negative hepatitis C RNA in November 2014 3. Chronic left ankle and right elbow arthropathy. 4. Celiac disease.  5. History of vitamin D deficiency.  6. History of hypertension.  7. 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  8. diagnosis of "scoliosis."  9. 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 02/11/2016 sigmoidectomy and partial cystectomy for colovesical fistula 11. 02/28/2016 exploratory laparotomy, repair of anterior and posterior bladder defects and clot evacuation 12. Factor VIII inhibitor August 2017 status post Rituxan, negative inhibitor evaluation September 2018 13.  Urethral stricture   Disposition:  [name] has hemophilia A.  He is now followed in the [facility].  [facility] for management of a urethral  stricture.  The factor VIII is being prescribed by the [facility] hemophilia team.  He will be discharged from the hematology clinic here.  I am available to see him in the future as needed.  15 minutes were spent with the patient today.  The majority of the time was used for counseling and coordination of care.  Thornton PapasGary Anastazia Creek, MD  06/15/2017  4:09 PM

## 2017-06-21 ENCOUNTER — Telehealth: Payer: Self-pay

## 2017-06-21 NOTE — Telephone Encounter (Signed)
Nol los F/u TBA. Per 11/13 los

## 2017-06-28 DIAGNOSIS — R7989 Other specified abnormal findings of blood chemistry: Secondary | ICD-10-CM | POA: Diagnosis not present

## 2017-07-06 DIAGNOSIS — Z87448 Personal history of other diseases of urinary system: Secondary | ICD-10-CM | POA: Diagnosis not present

## 2017-07-06 DIAGNOSIS — D68318 Other hemorrhagic disorder due to intrinsic circulating anticoagulants, antibodies, or inhibitors: Secondary | ICD-10-CM | POA: Diagnosis not present

## 2017-07-06 DIAGNOSIS — D66 Hereditary factor VIII deficiency: Secondary | ICD-10-CM | POA: Diagnosis not present

## 2017-07-16 DIAGNOSIS — M65342 Trigger finger, left ring finger: Secondary | ICD-10-CM | POA: Diagnosis not present

## 2017-07-30 DIAGNOSIS — M65342 Trigger finger, left ring finger: Secondary | ICD-10-CM | POA: Diagnosis not present

## 2017-09-06 DIAGNOSIS — A630 Anogenital (venereal) warts: Secondary | ICD-10-CM | POA: Diagnosis not present

## 2017-09-06 DIAGNOSIS — L858 Other specified epidermal thickening: Secondary | ICD-10-CM | POA: Diagnosis not present

## 2017-09-06 DIAGNOSIS — D1801 Hemangioma of skin and subcutaneous tissue: Secondary | ICD-10-CM | POA: Diagnosis not present

## 2017-09-06 DIAGNOSIS — L281 Prurigo nodularis: Secondary | ICD-10-CM | POA: Diagnosis not present

## 2017-09-14 ENCOUNTER — Ambulatory Visit: Payer: BLUE CROSS/BLUE SHIELD | Admitting: Neurology

## 2017-09-15 DIAGNOSIS — E559 Vitamin D deficiency, unspecified: Secondary | ICD-10-CM | POA: Diagnosis not present

## 2017-09-15 DIAGNOSIS — D66 Hereditary factor VIII deficiency: Secondary | ICD-10-CM | POA: Diagnosis not present

## 2017-09-15 DIAGNOSIS — R5383 Other fatigue: Secondary | ICD-10-CM | POA: Diagnosis not present

## 2017-09-15 DIAGNOSIS — N5089 Other specified disorders of the male genital organs: Secondary | ICD-10-CM | POA: Diagnosis not present

## 2017-09-15 DIAGNOSIS — Z113 Encounter for screening for infections with a predominantly sexual mode of transmission: Secondary | ICD-10-CM | POA: Diagnosis not present

## 2017-09-15 DIAGNOSIS — I10 Essential (primary) hypertension: Secondary | ICD-10-CM | POA: Diagnosis not present

## 2017-09-15 DIAGNOSIS — R040 Epistaxis: Secondary | ICD-10-CM | POA: Diagnosis not present

## 2017-09-15 DIAGNOSIS — R51 Headache: Secondary | ICD-10-CM | POA: Diagnosis not present

## 2017-09-15 DIAGNOSIS — R7309 Other abnormal glucose: Secondary | ICD-10-CM | POA: Diagnosis not present

## 2017-09-17 DIAGNOSIS — H40013 Open angle with borderline findings, low risk, bilateral: Secondary | ICD-10-CM | POA: Diagnosis not present

## 2017-09-17 DIAGNOSIS — H40053 Ocular hypertension, bilateral: Secondary | ICD-10-CM | POA: Diagnosis not present

## 2017-09-24 DIAGNOSIS — R040 Epistaxis: Secondary | ICD-10-CM | POA: Diagnosis not present

## 2017-10-08 DIAGNOSIS — R040 Epistaxis: Secondary | ICD-10-CM | POA: Diagnosis not present

## 2017-10-08 DIAGNOSIS — J3489 Other specified disorders of nose and nasal sinuses: Secondary | ICD-10-CM | POA: Diagnosis not present

## 2017-11-29 DIAGNOSIS — J309 Allergic rhinitis, unspecified: Secondary | ICD-10-CM | POA: Diagnosis not present

## 2017-11-29 DIAGNOSIS — J342 Deviated nasal septum: Secondary | ICD-10-CM | POA: Diagnosis not present

## 2017-11-29 DIAGNOSIS — R040 Epistaxis: Secondary | ICD-10-CM | POA: Diagnosis not present

## 2017-12-03 DIAGNOSIS — R040 Epistaxis: Secondary | ICD-10-CM | POA: Diagnosis not present

## 2017-12-03 DIAGNOSIS — D66 Hereditary factor VIII deficiency: Secondary | ICD-10-CM | POA: Diagnosis not present

## 2017-12-14 DIAGNOSIS — A630 Anogenital (venereal) warts: Secondary | ICD-10-CM | POA: Diagnosis not present

## 2017-12-14 DIAGNOSIS — I788 Other diseases of capillaries: Secondary | ICD-10-CM | POA: Diagnosis not present

## 2017-12-14 DIAGNOSIS — L918 Other hypertrophic disorders of the skin: Secondary | ICD-10-CM | POA: Diagnosis not present

## 2017-12-14 DIAGNOSIS — L281 Prurigo nodularis: Secondary | ICD-10-CM | POA: Diagnosis not present

## 2018-02-23 DIAGNOSIS — M79675 Pain in left toe(s): Secondary | ICD-10-CM | POA: Diagnosis not present

## 2018-02-23 DIAGNOSIS — L600 Ingrowing nail: Secondary | ICD-10-CM | POA: Diagnosis not present

## 2018-03-02 DIAGNOSIS — L600 Ingrowing nail: Secondary | ICD-10-CM | POA: Diagnosis not present

## 2018-03-11 DIAGNOSIS — H40013 Open angle with borderline findings, low risk, bilateral: Secondary | ICD-10-CM | POA: Diagnosis not present

## 2018-03-16 DIAGNOSIS — F329 Major depressive disorder, single episode, unspecified: Secondary | ICD-10-CM | POA: Diagnosis not present

## 2018-03-16 DIAGNOSIS — E782 Mixed hyperlipidemia: Secondary | ICD-10-CM | POA: Diagnosis not present

## 2018-03-16 DIAGNOSIS — R7309 Other abnormal glucose: Secondary | ICD-10-CM | POA: Diagnosis not present

## 2018-03-16 DIAGNOSIS — Z8619 Personal history of other infectious and parasitic diseases: Secondary | ICD-10-CM | POA: Diagnosis not present

## 2018-03-16 DIAGNOSIS — Z862 Personal history of diseases of the blood and blood-forming organs and certain disorders involving the immune mechanism: Secondary | ICD-10-CM | POA: Diagnosis not present

## 2018-03-16 DIAGNOSIS — E559 Vitamin D deficiency, unspecified: Secondary | ICD-10-CM | POA: Diagnosis not present

## 2018-03-16 DIAGNOSIS — E291 Testicular hypofunction: Secondary | ICD-10-CM | POA: Diagnosis not present

## 2018-03-17 DIAGNOSIS — E291 Testicular hypofunction: Secondary | ICD-10-CM | POA: Diagnosis not present

## 2018-05-04 DIAGNOSIS — M25542 Pain in joints of left hand: Secondary | ICD-10-CM | POA: Diagnosis not present

## 2018-05-04 DIAGNOSIS — G8929 Other chronic pain: Secondary | ICD-10-CM | POA: Diagnosis not present

## 2018-05-04 DIAGNOSIS — M25521 Pain in right elbow: Secondary | ICD-10-CM | POA: Diagnosis not present

## 2018-05-04 DIAGNOSIS — D68318 Other hemorrhagic disorder due to intrinsic circulating anticoagulants, antibodies, or inhibitors: Secondary | ICD-10-CM | POA: Diagnosis not present

## 2018-05-04 DIAGNOSIS — D66 Hereditary factor VIII deficiency: Secondary | ICD-10-CM | POA: Diagnosis not present

## 2018-05-04 DIAGNOSIS — M79642 Pain in left hand: Secondary | ICD-10-CM | POA: Diagnosis not present

## 2018-06-03 DIAGNOSIS — M79642 Pain in left hand: Secondary | ICD-10-CM | POA: Diagnosis not present

## 2018-06-03 DIAGNOSIS — M25521 Pain in right elbow: Secondary | ICD-10-CM | POA: Diagnosis not present

## 2018-06-17 DIAGNOSIS — D66 Hereditary factor VIII deficiency: Secondary | ICD-10-CM | POA: Diagnosis not present

## 2018-06-17 DIAGNOSIS — M79601 Pain in right arm: Secondary | ICD-10-CM | POA: Diagnosis not present

## 2018-06-17 DIAGNOSIS — E291 Testicular hypofunction: Secondary | ICD-10-CM | POA: Diagnosis not present

## 2018-06-17 DIAGNOSIS — I10 Essential (primary) hypertension: Secondary | ICD-10-CM | POA: Diagnosis not present

## 2018-06-17 DIAGNOSIS — E559 Vitamin D deficiency, unspecified: Secondary | ICD-10-CM | POA: Diagnosis not present

## 2018-06-17 DIAGNOSIS — R7309 Other abnormal glucose: Secondary | ICD-10-CM | POA: Diagnosis not present

## 2018-06-17 DIAGNOSIS — E782 Mixed hyperlipidemia: Secondary | ICD-10-CM | POA: Diagnosis not present

## 2018-06-23 DIAGNOSIS — N369 Urethral disorder, unspecified: Secondary | ICD-10-CM | POA: Diagnosis not present

## 2018-07-05 DIAGNOSIS — M25521 Pain in right elbow: Secondary | ICD-10-CM | POA: Diagnosis not present

## 2018-08-09 DIAGNOSIS — D66 Hereditary factor VIII deficiency: Secondary | ICD-10-CM | POA: Diagnosis not present

## 2018-08-09 DIAGNOSIS — N35914 Unspecified anterior urethral stricture, male: Secondary | ICD-10-CM | POA: Diagnosis not present

## 2018-08-09 DIAGNOSIS — E291 Testicular hypofunction: Secondary | ICD-10-CM | POA: Diagnosis not present

## 2018-08-10 DIAGNOSIS — L723 Sebaceous cyst: Secondary | ICD-10-CM | POA: Diagnosis not present

## 2018-08-18 DIAGNOSIS — R1032 Left lower quadrant pain: Secondary | ICD-10-CM | POA: Diagnosis not present

## 2018-08-19 DIAGNOSIS — L738 Other specified follicular disorders: Secondary | ICD-10-CM | POA: Diagnosis not present

## 2018-08-19 DIAGNOSIS — I788 Other diseases of capillaries: Secondary | ICD-10-CM | POA: Diagnosis not present

## 2018-08-19 DIAGNOSIS — L281 Prurigo nodularis: Secondary | ICD-10-CM | POA: Diagnosis not present

## 2018-08-22 ENCOUNTER — Telehealth: Payer: Self-pay | Admitting: Neurology

## 2018-08-22 DIAGNOSIS — G5601 Carpal tunnel syndrome, right upper limb: Secondary | ICD-10-CM | POA: Diagnosis not present

## 2018-08-22 NOTE — Telephone Encounter (Signed)
Pt states he is "Digging" into his head and its causing him to bleed and can not control it. He would like to see if he can either be seen or prescribed something for his nervousness. Please advise.

## 2018-08-22 NOTE — Telephone Encounter (Signed)
I called the patient.  The patient has an irresistible tendency to scratch his head and call sores.  This may be a form of OCD behavior.  I will see the patient, initiate medications for this.  The patient is also concerned about a prior pineal cyst.

## 2018-08-22 NOTE — Telephone Encounter (Signed)
I contacted the pt. He states over the last couple of weeks he has been more nervous and has been unable to control the impulse to pick at his scalp. Pt states he has been pick his scalp so often he has been making it bleed. Pt states he is receiving steroid injections at his dermatologist to help heal the areas, but he is unable to let the sores heal properly. Patient is requesting to see the MD to discuss this new issue. Pt also wanted MD to be advised he has not had a MRI in at least 5 years to revaluate his Pineal cyst ( epic has MRI report from 07/11/2016). Pt wanted to know if MD would recommend a recheck of the cyst, pt is worried the cyst is growing.  I recommended we schedule a o/v for the pt to discuss further with MD. Pt was agreeable to this recommendation. Pt scheduled for 08/26/18 at 12 pm. I advised the pt I would fwd message to MD to verify if any other recommendations would be made pending his appt. Pt was agreeable.

## 2018-08-23 DIAGNOSIS — R1032 Left lower quadrant pain: Secondary | ICD-10-CM | POA: Diagnosis not present

## 2018-08-23 DIAGNOSIS — R7989 Other specified abnormal findings of blood chemistry: Secondary | ICD-10-CM | POA: Diagnosis not present

## 2018-08-23 DIAGNOSIS — E291 Testicular hypofunction: Secondary | ICD-10-CM | POA: Diagnosis not present

## 2018-08-26 ENCOUNTER — Ambulatory Visit (INDEPENDENT_AMBULATORY_CARE_PROVIDER_SITE_OTHER): Payer: BLUE CROSS/BLUE SHIELD | Admitting: Neurology

## 2018-08-26 ENCOUNTER — Encounter: Payer: Self-pay | Admitting: Neurology

## 2018-08-26 VITALS — BP 138/81 | HR 98 | Ht 70.0 in | Wt 170.0 lb

## 2018-08-26 DIAGNOSIS — Z8639 Personal history of other endocrine, nutritional and metabolic disease: Secondary | ICD-10-CM | POA: Diagnosis not present

## 2018-08-26 DIAGNOSIS — G4489 Other headache syndrome: Secondary | ICD-10-CM

## 2018-08-26 DIAGNOSIS — F428 Other obsessive-compulsive disorder: Secondary | ICD-10-CM | POA: Diagnosis not present

## 2018-08-26 HISTORY — DX: Personal history of other endocrine, nutritional and metabolic disease: Z86.39

## 2018-08-26 MED ORDER — MIRTAZAPINE 15 MG PO TABS: 15.0000 mg | ORAL_TABLET | Freq: Every day | ORAL | 2 refills | Status: DC

## 2018-08-26 NOTE — Progress Notes (Signed)
Reason for visit: Headache, OCD behavior  Darrell Graham. is an 53 y.o. male  History of present illness:  [name] is a 53 year old right-handed white male with a history of OCD behavior, he comes in today indicating that he uncontrollably will reach up and try to scratch the top of his head, he has a scab there, and he cannot prevent himself from continuing to scratch this and let the area heal.  The patient also has a history of a pineal cyst, this was last evaluated in 2017, the patient is concerned about this.  The patient has not been sleeping well at night, he has been losing weight as well.  He reports tinnitus in the ears.  He recently had a little bit of blood coming from the left ear.  He comes to this office for an evaluation.  Past Medical History:  Diagnosis Date  . Allergy   . Asthma   . Celiac disease   . Chronic arthropathy    left ankle & right elbow  . Deviated nasal septum   . Diverticulitis   . Glaucoma   . H/O vitamin D deficiency   . 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 Hepatitis C virus    recent viral titer negative  . Hernia   . High blood pressure   . OCD (obsessive compulsive disorder) 07/01/2015  . Scoliosis   . Telangiectasias    upper back and chest  . UTI (urinary tract infection) 09/2015    Past Surgical History:  Procedure Laterality Date  . ANKLE ARTHROSCOPY  1996  . BLADDER SURGERY  2017  . SCROTAL SURGERY    . TRIGGER FINGER RELEASE Left    March 2018  . URETHRA SURGERY     June 2018- dilation of urethra     Family History  Problem Relation Age of Onset  . Dementia Mother   . Migraines Father   . Stroke Father   . Heart disease Father   . Hemophilia Unknown   . Hepatitis C Unknown   . Osteopenia Unknown   . Psoriasis Unknown   . GER disease Unknown     Social history:  reports that he has never smoked. He has never used smokeless tobacco. He reports that he does not drink alcohol or use drugs.    Allergies  Allergen  Reactions  . Aspirin Other (See Comments)    Bleeding   . Hemophilia Support [Acid Blockers Support] Other (See Comments)    Hemophilia Factor VIII  . Codeine Itching and Rash  . Gluten Meal Diarrhea, Itching and Rash    General lethargy  . Tetracycline Hcl Itching and Rash  . Tetracyclines & Related Itching and Rash  . Wheat Bran Diarrhea, Itching and Rash    General lethargy    Medications:  Prior to Admission medications   Medication Sig Start Date End Date Taking? Authorizing Provider  ALPRAZolam Duanne Moron) 0.25 MG tablet Take 0.25 mg by mouth at bedtime as needed for anxiety.   Yes [provider]  amLODipine (NORVASC) 5 MG tablet Take 5 mg by mouth daily.   Yes [provider]  Antihemophilic Factor rAHF-PAF (XYNTHA SOLOFUSE) 3000 UNITS KIT Inject 3,000 Units into the vein as needed. Patient taking differently: Inject 3,000 Units into the vein as needed (for bleeding).  07/22/15  Yes Ladell Pier, MD  cholecalciferol (VITAMIN D) 1000 UNITS tablet Take 1,000 Units by mouth every evening.    Yes [provider]  Dapsone (ACZONE EX) Apply 1 application topically daily as needed (for acne).    Yes [provider]  Emicizumab-kxwh [facility]) Inject into the skin.   Yes [provider]  hydrochlorothiazide (MICROZIDE) 12.5 MG capsule Take 12.5 mg by mouth daily.  11/25/15  Yes [provider]  HYDROcodone-acetaminophen (NORCO/VICODIN) 5-325 MG tablet Take 1-2 tablets by mouth every 6 (six) hours as needed for moderate pain or severe pain. 09/27/15  Yes Domenic Polite, MD  losartan (COZAAR) 50 MG tablet Take 50 mg by mouth daily.   Yes [provider]  Multiple Vitamins-Minerals (MULTIVITAMIN PO) Take 1 tablet by mouth daily.   Yes [provider]  Naphazoline-Pheniramine (OPCON-A) 0.027-0.315 % SOLN Apply 1 drop to eye daily as needed (for dry eyes).   Yes [provider]  pantoprazole (PROTONIX) 40 MG  tablet Take 40 mg daily by mouth. 02/24/16  Yes [provider]    ROS:  Out of a complete 14 system review of symptoms, the patient complains only of the following symptoms, and all other reviewed systems are negative.  Decreased weight Hearing loss, ringing in ears, runny nose Eye itching, light sensitivity Excessive thirst Abdominal pain Restless legs, insomnia Food allergies Difficulty urinating Joint pain, back pain, aching muscles, neck pain Skin wounds, itching Bruising easily Headache Depression, anxiety  Blood pressure 138/81, pulse 98, height '5\' 10"'  (1.778 m), weight 170 lb (77.1 kg).  Physical Exam  General: The patient is alert and cooperative at the time of the examination.  Ears: Tympanic membranes are clear bilaterally.  A small excoriation was noted in the external auditory canal on the left.  Skin: No significant peripheral edema is noted.   Neurologic Exam  Mental status: The patient is alert and oriented x 3 at the time of the examination. The patient has apparent normal recent and remote memory, with an apparently normal attention span and concentration ability.   Cranial nerves: Facial symmetry is present. Speech is normal, no aphasia or dysarthria is noted. Extraocular movements are full. Visual fields are full.  Motor: The patient has good strength in all 4 extremities.  Sensory examination: Soft touch sensation is symmetric on the face, arms, and legs.  Coordination: The patient has good finger-nose-finger and heel-to-shin bilaterally.  Gait and station: The patient has a normal gait. Tandem gait is normal. Romberg is negative. No drift is seen.  Reflexes: Deep tendon reflexes are symmetric.   Assessment/Plan:  1.  Headache  2.  OCD behavior  3.  Pineal cyst  We will recheck a CT of the head to review the size of the pineal cyst from 2017, this appears to be benign in nature.  The patient has OCD behavior, he will be placed on  Remeron 15 mg at night to help the sleep and weight loss issue as well.  The patient will call for any dose adjustments.  The patient will follow-up in 6 months, sooner if needed.  Jill Alexanders MD 08/26/2018 12:41 PM  [facility] [address] [place], [place] [address]  Phone [phone] Fax [phone]

## 2018-08-29 ENCOUNTER — Telehealth: Payer: Self-pay | Admitting: Neurology

## 2018-08-29 NOTE — Telephone Encounter (Signed)
[employer] Auth: [id number] (exp. 08/29/18 to 09/27/18) order sent to GI patient is aware

## 2018-09-06 ENCOUNTER — Ambulatory Visit
Admission: RE | Admit: 2018-09-06 | Discharge: 2018-09-06 | Disposition: A | Discharge status: Home / Self Care | Payer: BLUE CROSS/BLUE SHIELD | Source: Ambulatory Visit | Attending: Neurology | Admitting: Neurology

## 2018-09-06 DIAGNOSIS — R42 Dizziness and giddiness: Secondary | ICD-10-CM | POA: Diagnosis not present

## 2018-09-06 DIAGNOSIS — G4489 Other headache syndrome: Secondary | ICD-10-CM

## 2018-09-06 DIAGNOSIS — Z8639 Personal history of other endocrine, nutritional and metabolic disease: Secondary | ICD-10-CM

## 2018-09-07 ENCOUNTER — Other Ambulatory Visit: Payer: Self-pay

## 2018-09-07 ENCOUNTER — Telehealth: Payer: Self-pay | Admitting: Neurology

## 2018-09-07 DIAGNOSIS — I7090 Unspecified atherosclerosis: Secondary | ICD-10-CM

## 2018-09-07 MED ORDER — ESCITALOPRAM OXALATE 5 MG PO TABS: 5.0000 mg | ORAL_TABLET | Freq: Every day | ORAL | 1 refills | Status: DC

## 2018-09-07 NOTE — Telephone Encounter (Signed)
  I called the patient.  The pineal cyst appears to be benign and stable.  Some mild atherosclerotic calcification is seen, good follow-up with her primary doctor for blood pressure and cholesterol levels is important.   CT head 09/07/18:  IMPRESSION: No acute finding or clear explanation for the clinical presentation. Mild generalized age related volume loss.  Chronic pineal cyst with benign appearing wall calcification does not show enlargement.  There is atherosclerotic calcification of the major vessels at the base of the brain.

## 2018-09-07 NOTE — Addendum Note (Signed)
Addended by: York Spaniel on: 09/07/2018 02:00 PM   Modules accepted: Orders

## 2018-09-07 NOTE — Telephone Encounter (Signed)
I called the patient.  The results of the CT scan were discussed with him, there is evidence of atherosclerosis on the CT, we will go ahead and get a carotid Doppler study for this reason.  He does have a history of hypertension.  The patient could not tolerate the Remeron, even tried breaking the 15 mg tablet in half but could not tolerate it.  I will try low-dose Lexapro instead.

## 2018-09-07 NOTE — Telephone Encounter (Signed)
Pt called wanting to ask a couple of questions about the results that were given to him. He would also like a copy of these results mailed to him. Pt would also like to inform us that the mirtazapine (REMERON) 15 MG tablet is to strong for him. Please advise.

## 2018-09-07 NOTE — Telephone Encounter (Signed)
I called the patient, left a message, I will call back later. 

## 2018-09-13 DIAGNOSIS — M25521 Pain in right elbow: Secondary | ICD-10-CM | POA: Diagnosis not present

## 2018-09-13 DIAGNOSIS — G5601 Carpal tunnel syndrome, right upper limb: Secondary | ICD-10-CM | POA: Diagnosis not present

## 2018-09-16 DIAGNOSIS — M79642 Pain in left hand: Secondary | ICD-10-CM | POA: Diagnosis not present

## 2018-09-19 DIAGNOSIS — M79601 Pain in right arm: Secondary | ICD-10-CM | POA: Diagnosis not present

## 2018-09-19 DIAGNOSIS — E559 Vitamin D deficiency, unspecified: Secondary | ICD-10-CM | POA: Diagnosis not present

## 2018-09-19 DIAGNOSIS — Z113 Encounter for screening for infections with a predominantly sexual mode of transmission: Secondary | ICD-10-CM | POA: Diagnosis not present

## 2018-09-19 DIAGNOSIS — E291 Testicular hypofunction: Secondary | ICD-10-CM | POA: Diagnosis not present

## 2018-09-19 DIAGNOSIS — R634 Abnormal weight loss: Secondary | ICD-10-CM | POA: Diagnosis not present

## 2018-09-19 DIAGNOSIS — D66 Hereditary factor VIII deficiency: Secondary | ICD-10-CM | POA: Diagnosis not present

## 2018-09-19 DIAGNOSIS — R7309 Other abnormal glucose: Secondary | ICD-10-CM | POA: Diagnosis not present

## 2018-09-19 DIAGNOSIS — I10 Essential (primary) hypertension: Secondary | ICD-10-CM | POA: Diagnosis not present

## 2018-09-23 DIAGNOSIS — H40013 Open angle with borderline findings, low risk, bilateral: Secondary | ICD-10-CM | POA: Diagnosis not present

## 2018-09-29 DIAGNOSIS — Z113 Encounter for screening for infections with a predominantly sexual mode of transmission: Secondary | ICD-10-CM | POA: Diagnosis not present

## 2018-09-30 DIAGNOSIS — N35914 Unspecified anterior urethral stricture, male: Secondary | ICD-10-CM | POA: Diagnosis not present

## 2018-09-30 DIAGNOSIS — N321 Vesicointestinal fistula: Secondary | ICD-10-CM | POA: Diagnosis not present

## 2018-09-30 DIAGNOSIS — D66 Hereditary factor VIII deficiency: Secondary | ICD-10-CM | POA: Diagnosis not present

## 2018-09-30 DIAGNOSIS — E291 Testicular hypofunction: Secondary | ICD-10-CM | POA: Diagnosis not present

## 2018-09-30 DIAGNOSIS — R3912 Poor urinary stream: Secondary | ICD-10-CM | POA: Diagnosis not present

## 2018-10-04 DIAGNOSIS — M25521 Pain in right elbow: Secondary | ICD-10-CM | POA: Diagnosis not present

## 2018-10-17 DIAGNOSIS — M25521 Pain in right elbow: Secondary | ICD-10-CM | POA: Diagnosis not present

## 2018-10-18 DIAGNOSIS — M25521 Pain in right elbow: Secondary | ICD-10-CM | POA: Diagnosis not present

## 2018-10-18 DIAGNOSIS — M65342 Trigger finger, left ring finger: Secondary | ICD-10-CM | POA: Diagnosis not present

## 2018-11-28 DIAGNOSIS — M25521 Pain in right elbow: Secondary | ICD-10-CM | POA: Diagnosis not present

## 2018-12-07 ENCOUNTER — Telehealth: Payer: Self-pay | Admitting: Neurology

## 2018-12-07 ENCOUNTER — Encounter (HOSPITAL_COMMUNITY): Payer: BLUE CROSS/BLUE SHIELD

## 2018-12-07 ENCOUNTER — Ambulatory Visit (HOSPITAL_COMMUNITY)
Admission: RE | Admit: 2018-12-07 | Discharge: 2018-12-07 | Disposition: A | Discharge status: Home / Self Care | Payer: BLUE CROSS/BLUE SHIELD | Source: Ambulatory Visit | Attending: Neurology | Admitting: Neurology

## 2018-12-07 ENCOUNTER — Other Ambulatory Visit: Payer: Self-pay

## 2018-12-07 DIAGNOSIS — I7090 Unspecified atherosclerosis: Secondary | ICD-10-CM | POA: Insufficient documentation

## 2018-12-07 NOTE — Progress Notes (Signed)
Carotid duplex completed. Results in Chart review CV Proc. [employer], RVS 12/07/2018, 4:17 PM

## 2018-12-07 NOTE — Telephone Encounter (Signed)
I called the patient. The doppler study was unremarkable.  Carotid doppler 12/07/18:  Summary: Right Carotid: Velocities in the right ICA are consistent with a 1-39% stenosis.  Left Carotid: Velocities in the left ICA are consistent with a 1-39% stenosis.  Vertebrals:  Bilateral vertebral arteries demonstrate antegrade flow. Subclavians: Normal flow hemodynamics were seen in bilateral subclavian              arteries.

## 2018-12-21 DIAGNOSIS — M2550 Pain in unspecified joint: Secondary | ICD-10-CM | POA: Diagnosis not present

## 2018-12-21 DIAGNOSIS — I10 Essential (primary) hypertension: Secondary | ICD-10-CM | POA: Diagnosis not present

## 2018-12-21 DIAGNOSIS — R079 Chest pain, unspecified: Secondary | ICD-10-CM | POA: Diagnosis not present

## 2018-12-21 DIAGNOSIS — D66 Hereditary factor VIII deficiency: Secondary | ICD-10-CM | POA: Diagnosis not present

## 2018-12-22 ENCOUNTER — Ambulatory Visit: Payer: BLUE CROSS/BLUE SHIELD | Admitting: Neurology

## 2019-01-03 DIAGNOSIS — M79642 Pain in left hand: Secondary | ICD-10-CM | POA: Diagnosis not present

## 2019-01-12 DIAGNOSIS — E291 Testicular hypofunction: Secondary | ICD-10-CM | POA: Diagnosis not present

## 2019-01-12 DIAGNOSIS — E559 Vitamin D deficiency, unspecified: Secondary | ICD-10-CM | POA: Diagnosis not present

## 2019-01-12 DIAGNOSIS — I10 Essential (primary) hypertension: Secondary | ICD-10-CM | POA: Diagnosis not present

## 2019-01-12 DIAGNOSIS — R079 Chest pain, unspecified: Secondary | ICD-10-CM | POA: Diagnosis not present

## 2019-01-12 DIAGNOSIS — R7309 Other abnormal glucose: Secondary | ICD-10-CM | POA: Diagnosis not present

## 2019-01-24 DIAGNOSIS — M79642 Pain in left hand: Secondary | ICD-10-CM | POA: Diagnosis not present

## 2019-01-24 DIAGNOSIS — M65342 Trigger finger, left ring finger: Secondary | ICD-10-CM | POA: Diagnosis not present

## 2019-01-24 DIAGNOSIS — M25521 Pain in right elbow: Secondary | ICD-10-CM | POA: Diagnosis not present

## 2019-01-31 DIAGNOSIS — M25521 Pain in right elbow: Secondary | ICD-10-CM | POA: Diagnosis not present

## 2019-01-31 DIAGNOSIS — M79642 Pain in left hand: Secondary | ICD-10-CM | POA: Diagnosis not present

## 2019-02-21 DIAGNOSIS — M79642 Pain in left hand: Secondary | ICD-10-CM | POA: Diagnosis not present

## 2019-02-21 DIAGNOSIS — M65342 Trigger finger, left ring finger: Secondary | ICD-10-CM | POA: Diagnosis not present

## 2019-02-21 DIAGNOSIS — M25521 Pain in right elbow: Secondary | ICD-10-CM | POA: Diagnosis not present

## 2019-02-28 ENCOUNTER — Ambulatory Visit: Payer: BC Managed Care – PPO | Admitting: Neurology

## 2019-02-28 ENCOUNTER — Other Ambulatory Visit: Payer: Self-pay

## 2019-02-28 ENCOUNTER — Encounter: Payer: Self-pay | Admitting: Neurology

## 2019-02-28 VITALS — BP 122/72 | HR 74 | Temp 97.3°F | Ht 70.0 in | Wt 179.8 lb

## 2019-02-28 DIAGNOSIS — F428 Other obsessive-compulsive disorder: Secondary | ICD-10-CM

## 2019-02-28 DIAGNOSIS — R51 Headache: Secondary | ICD-10-CM | POA: Diagnosis not present

## 2019-02-28 MED ORDER — ESCITALOPRAM OXALATE 5 MG PO TABS: 5.0000 mg | ORAL_TABLET | Freq: Every day | ORAL | 1 refills | Status: DC

## 2019-02-28 NOTE — Progress Notes (Signed)
I have read the note, and I agree with the clinical assessment and plan.  Ever Halberg K Taia Bramlett   

## 2019-02-28 NOTE — Progress Notes (Signed)
PATIENT: Darrell Graham. DOB: [birth year]  REASON FOR VISIT: follow up HISTORY FROM: patient  HISTORY OF PRESENT ILLNESS: Today 02/28/19  Darrell Graham is a 53 year old male with history of OCD behavior and history of pineal cyst.  At last visit, he was placed on Remeron 15 mg at night to help with sleep and weight loss.  He was unable to tolerate the medication, he was switched to Lexapro.  He had CT scan of the brain in February 2020, the pineal cyst was stable and benign.  Some mild atherosclerotic calcification was seen.  Due to the atherosclerosis, carotid Doppler was obtained and was unremarkable.  He never started taking Lexapro.  He has continued to have OCD behavior, with the uncontrollable urge to reach out and itch his head.  In the past, he was prescribed Effexor however he never took the medication.  He did not start Lexapro, because he read unfavorable reviews.  He says at night when he lays on the left side of his head he may feel pressure.  He says overall he sleeps well at night.  He reports with the extreme heat, he may get headaches.  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  He presents today for follow-up unaccompanied.  HISTORY 08/26/2018 Dr. Jannifer Franklin: Mr. Chong is a 53 year old right-handed white male with a history of OCD behavior, he comes in today indicating that he uncontrollably will reach up and try to scratch the top of his head, he has a scab there, and he cannot prevent himself from continuing to scratch this and let the area heal.  The patient also has a history of a pineal cyst, this was last evaluated in 2017, the patient is concerned about this.  The patient has not been sleeping well at night, he has been losing weight as well.  He reports tinnitus in the ears.  He recently had a little bit of blood coming from the left ear.  He comes to this office for an evaluation.  REVIEW OF SYSTEMS: Out of a complete 14 system review of symptoms, the  patient complains only of the following symptoms, and all other reviewed systems are negative.  OCD behavior, itching, headache  ALLERGIES: Allergies  Allergen Reactions  . Aspirin Other (See Comments)    Bleeding   . Hemophilia Support [Acid Blockers Support] Other (See Comments)    Hemophilia Factor VIII  . Codeine Itching and Rash  . Gluten Meal Diarrhea, Itching and Rash    General lethargy  . Tetracycline Hcl Itching and Rash  . Tetracyclines & Related Itching and Rash  . Wheat Bran Diarrhea, Itching and Rash    General lethargy    HOME MEDICATIONS: Outpatient Medications Prior to Visit  Medication Sig Dispense Refill  . ALPRAZolam (XANAX) 0.25 MG tablet Take 0.25 mg by mouth at bedtime as needed for anxiety.    Marland Kitchen amLODipine (NORVASC) 5 MG tablet Take 5 mg by mouth daily.    Marland Kitchen Antihemophilic Factor rAHF-PAF (XYNTHA SOLOFUSE) 3000 UNITS KIT Inject 3,000 Units into the vein as needed. (Patient taking differently: Inject 3,000 Units into the vein as needed (for bleeding). ) 1 kit 1  . celecoxib (CELEBREX) 100 MG capsule celecoxib 100 mg capsule  TAKE 1 CAPSULE BY MOUTH TWICE DAILY    . cholecalciferol (VITAMIN D) 1000 UNITS tablet Take 1,000 Units by mouth every evening.     . Dapsone (ACZONE EX) Apply 1 application topically daily as needed (for  acne).     . Emicizumab-kxwh (HEMLIBRA [place]) Inject into the skin.    . hydrochlorothiazide (MICROZIDE) 12.5 MG capsule Take 12.5 mg by mouth daily.   3  . HYDROcodone-acetaminophen (NORCO/VICODIN) 5-325 MG tablet Take 1-2 tablets by mouth every 6 (six) hours as needed for moderate pain or severe pain. 20 tablet 0  . lidocaine (LIDODERM) 5 % lidocaine 5 % topical patch  APPLY 1 2 PATCH TO INTACT SKIN ONCE A DAY. REMOVE AFTER 12 HOURS AS NEEDED    . losartan (COZAAR) 50 MG tablet Take 50 mg by mouth daily.    . Multiple Vitamins-Minerals (MULTIVITAMIN PO) Take 1 tablet by mouth daily.    . Naphazoline-Pheniramine (OPCON-A) 0.027-0.315 %  SOLN Apply 1 drop to eye daily as needed (for dry eyes).    . pantoprazole (PROTONIX) 40 MG tablet Take 40 mg daily by mouth.    . escitalopram (LEXAPRO) 5 MG tablet Take 1 tablet (5 mg total) by mouth at bedtime. 30 tablet 1   No facility-administered medications prior to visit.     PAST MEDICAL HISTORY: Past Medical History:  Diagnosis Date  . Allergy   . Asthma   . Celiac disease   . Chronic arthropathy    left ankle & right elbow  . Deviated nasal septum   . Diverticulitis   . Glaucoma   . H/O vitamin D deficiency   . 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 Hepatitis C virus    recent viral titer negative  . Hernia   . High blood pressure   . History of pineal cyst 08/26/2018  . OCD (obsessive compulsive disorder) 07/01/2015  . Scoliosis   . Telangiectasias    upper back and chest  . UTI (urinary tract infection) 09/2015    PAST SURGICAL HISTORY: Past Surgical History:  Procedure Laterality Date  . ANKLE ARTHROSCOPY  1996  . BLADDER SURGERY  2017  . SCROTAL SURGERY    . TRIGGER FINGER RELEASE Left    March 2018  . URETHRA SURGERY     June 2018- dilation of urethra     FAMILY HISTORY: Family History  Problem Relation Age of Onset  . Dementia Mother   . Migraines Father   . Stroke Father   . Heart disease Father   . Hemophilia Other   . Hepatitis C Other   . Osteopenia Other   . Psoriasis Other   . GER disease Other     SOCIAL HISTORY: Social History   Socioeconomic History  . Marital status: Single    Spouse name: Not on file  . Number of children: 0  . Years of education: 9  . Highest education level: Not on file  Occupational History    Comment: Not working at this time.  Social Needs  . Financial resource strain: Not on file  . Food insecurity    Worry: Not on file    Inability: Not on file  . Transportation needs    Medical: Not on file    Non-medical: Not on file  Tobacco Use  . Smoking status: Never Smoker  . Smokeless tobacco: Never Used   Substance and Sexual Activity  . Alcohol use: No  . Drug use: No  . Sexual activity: Not on file  Lifestyle  . Physical activity    Days per week: Not on file    Minutes per session: Not on file  . Stress: Not on file  Relationships  . Social connections  Talks on phone: Not on file    Gets together: Not on file    Attends religious service: Not on file    Active member of club or organization: Not on file    Attends meetings of clubs or organizations: Not on file    Relationship status: Not on file  . Intimate partner violence    Fear of current or ex partner: Not on file    Emotionally abused: Not on file    Physically abused: Not on file    Forced sexual activity: Not on file  Other Topics Concern  . Not on file  Social History Narrative   Patient is single and lives at home with his mother. Patient has a high school education.   Caffeine- 1-2 daily.   Right handed.    PHYSICAL EXAM  Vitals:   02/28/19 1601  BP: 122/72  Pulse: 74  Temp: (!) 97.3 F (36.3 C)  Weight: 179 lb 12.8 oz (81.6 kg)  Height: _0  (1.778 m)   Body mass index is 25.8 kg/m.  Generalized: Well developed, in no acute distress   Neurological examination  Mentation: Alert oriented to time, place, history taking. Follows all commands speech and language fluent Cranial nerve II-XII: Pupils were equal round reactive to light. Extraocular movements were full, visual field were full on confrontational test. Facial sensation and strength were normal. Uvula tongue midline. Head turning and shoulder shrug  were normal and symmetric. Motor: The motor testing reveals 5 over 5 strength of all 4 extremities. Good symmetric motor tone is noted throughout.  Sensory: Sensory testing is intact to soft touch on all 4 extremities. No evidence of extinction is noted.  Coordination: Cerebellar testing reveals good finger-nose-finger and heel-to-shin bilaterally.  Gait and station: Gait is normal. Tandem gait  is normal. Romberg is negative. No drift is seen.  Reflexes: Deep tendon reflexes are symmetric and normal bilaterally.   DIAGNOSTIC DATA (LABS, IMAGING, TESTING) - I reviewed patient records, labs, notes, testing and imaging myself where available.  Lab Results  Component Value Date   WBC 4.1 09/26/2015   HGB 10.4 (L) 09/26/2015   HCT 31.6 (L) 09/26/2015   MCV 91.1 09/26/2015   PLT 326 09/26/2015      Component Value Date/Time   NA 141 09/23/2015 0152   NA 140 06/19/2013 1316   K 3.6 09/23/2015 0152   K 3.9 06/19/2013 1316   CL 108 09/23/2015 0152   CO2 25 09/23/2015 0152   CO2 24 06/19/2013 1316   GLUCOSE 121 (H) 09/23/2015 0152   GLUCOSE 117 06/19/2013 1316   BUN 7 09/23/2015 0152   BUN 17.2 06/19/2013 1316   CREATININE 0.79 09/23/2015 0152   CREATININE 0.9 06/19/2013 1316   CALCIUM 8.6 (L) 09/23/2015 0152   CALCIUM 10.2 06/19/2013 1316   PROT 6.7 09/23/2015 0152   PROT 8.1 06/19/2013 1316   ALBUMIN 3.1 (L) 09/23/2015 0152   ALBUMIN 4.7 06/19/2013 1316   AST 16 09/23/2015 0152   AST 17 06/19/2013 1316   ALT 11 (L) 09/23/2015 0152   ALT 10 06/19/2013 1316   ALKPHOS 53 09/23/2015 0152   ALKPHOS 72 06/19/2013 1316   BILITOT 0.6 09/23/2015 0152   BILITOT 0.63 06/19/2013 1316   GFRNONAA >60 09/23/2015 0152   GFRAA >60 09/23/2015 0152   No results found for: CHOL, HDL, LDLCALC, LDLDIRECT, TRIG, CHOLHDL No results found for: HGBA1C No results found for: VITAMINB12 No results found for: TSH    ASSESSMENT  AND PLAN 53 y.o. 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 here with:  1.  OCD behavior 2.  Pineal cyst 3.  Headaches  After discussion, he has decided to try the medication Dr. Jannifer Franklin had originally prescribed, Lexapro 5 mg at bedtime for OCD behavior.  If he cannot tolerate medication, he will call for medication adjustment.  If his headaches become more consistent or concerning he will let me know. He will follow-up in 6 months or sooner if needed.  He is agreeable to see me at next visit, if Dr. Tobey Grim schedule is full.    I spent 15 minutes with the patient. 50% of this time was spent discussing his plan of care.   Butler Denmark, AGNP-C, DNP 02/28/2019, 4:37 PM [facility] [address] [place], [place] [address] [phone]

## 2019-02-28 NOTE — Patient Instructions (Signed)
Start Lexapro 5 mg at bedtime. Call for dose adjustments. We will see you in 6 months!

## 2019-03-07 DIAGNOSIS — M25521 Pain in right elbow: Secondary | ICD-10-CM | POA: Diagnosis not present

## 2019-03-09 DIAGNOSIS — R252 Cramp and spasm: Secondary | ICD-10-CM | POA: Diagnosis not present

## 2019-03-09 DIAGNOSIS — E559 Vitamin D deficiency, unspecified: Secondary | ICD-10-CM | POA: Diagnosis not present

## 2019-03-15 DIAGNOSIS — H40053 Ocular hypertension, bilateral: Secondary | ICD-10-CM | POA: Diagnosis not present

## 2019-03-15 DIAGNOSIS — H5212 Myopia, left eye: Secondary | ICD-10-CM | POA: Diagnosis not present

## 2019-03-21 DIAGNOSIS — M25521 Pain in right elbow: Secondary | ICD-10-CM | POA: Diagnosis not present

## 2019-03-21 DIAGNOSIS — M65342 Trigger finger, left ring finger: Secondary | ICD-10-CM | POA: Diagnosis not present

## 2019-04-04 DIAGNOSIS — M25521 Pain in right elbow: Secondary | ICD-10-CM | POA: Diagnosis not present

## 2019-05-03 DIAGNOSIS — I10 Essential (primary) hypertension: Secondary | ICD-10-CM | POA: Diagnosis not present

## 2019-05-03 DIAGNOSIS — Z8619 Personal history of other infectious and parasitic diseases: Secondary | ICD-10-CM | POA: Diagnosis not present

## 2019-05-03 DIAGNOSIS — D66 Hereditary factor VIII deficiency: Secondary | ICD-10-CM | POA: Diagnosis not present

## 2019-05-03 DIAGNOSIS — R5383 Other fatigue: Secondary | ICD-10-CM | POA: Diagnosis not present

## 2019-05-03 DIAGNOSIS — R7309 Other abnormal glucose: Secondary | ICD-10-CM | POA: Diagnosis not present

## 2019-05-09 DIAGNOSIS — L281 Prurigo nodularis: Secondary | ICD-10-CM | POA: Diagnosis not present

## 2019-05-09 DIAGNOSIS — L821 Other seborrheic keratosis: Secondary | ICD-10-CM | POA: Diagnosis not present

## 2019-05-10 DIAGNOSIS — K1379 Other lesions of oral mucosa: Secondary | ICD-10-CM | POA: Diagnosis not present

## 2019-05-10 DIAGNOSIS — Z8619 Personal history of other infectious and parasitic diseases: Secondary | ICD-10-CM | POA: Diagnosis not present

## 2019-05-10 DIAGNOSIS — M2550 Pain in unspecified joint: Secondary | ICD-10-CM | POA: Diagnosis not present

## 2019-05-10 DIAGNOSIS — G8929 Other chronic pain: Secondary | ICD-10-CM | POA: Diagnosis not present

## 2019-05-10 DIAGNOSIS — D66 Hereditary factor VIII deficiency: Secondary | ICD-10-CM | POA: Diagnosis not present

## 2019-05-16 DIAGNOSIS — M65342 Trigger finger, left ring finger: Secondary | ICD-10-CM | POA: Diagnosis not present

## 2019-05-16 DIAGNOSIS — M25521 Pain in right elbow: Secondary | ICD-10-CM | POA: Diagnosis not present

## 2019-05-30 IMAGING — CT CT HEAD W/O CM
3 of 4 series · 15 of 47 positions shown, 18 images · non-contrast
Comparison: MRI 07/11/2016.  CT 04/02/2011.

CLINICAL DATA: Chronic headache. Dizziness and tinnitus. History of
pineal cyst.

EXAM:
CT HEAD WITHOUT CONTRAST
TECHNIQUE: Contiguous axial images were obtained from the base of the skull
through the vertex without intravenous contrast.

[Series 2: head 5.00 hr40 s3 ibhc · axial · 0.44mm/px · z∈[-628,-483]mm · 9 of 35 slices shown, 12 images]
[im 3/35  brain]
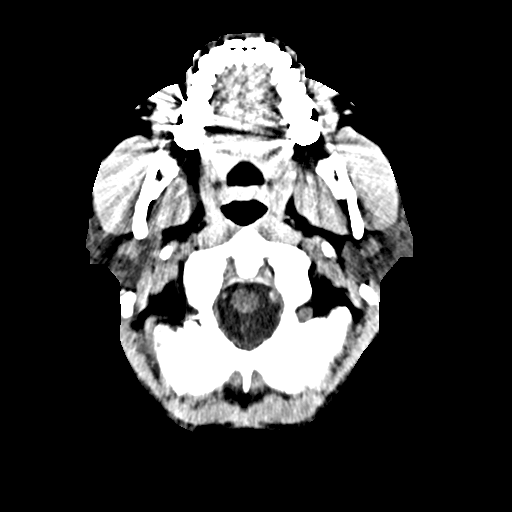
[im 3/35  bone]
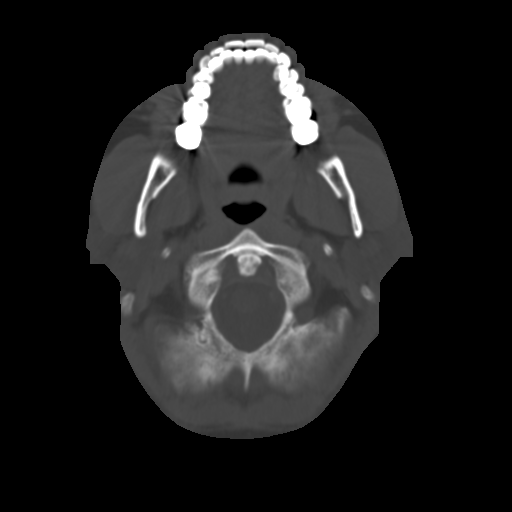
[im 8/35  brain]
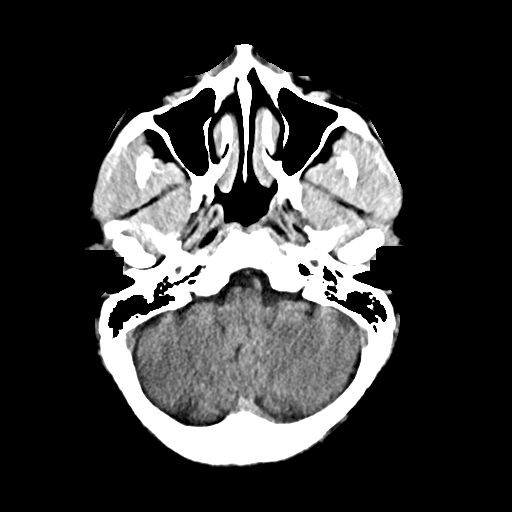
[im 10/35  brain]
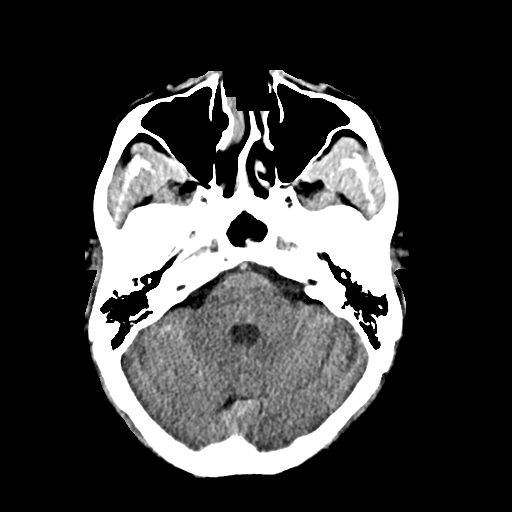
[im 15/35  brain]
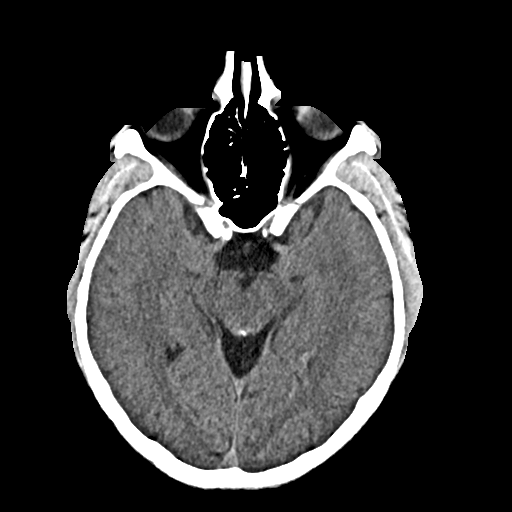
[im 18/35  brain]
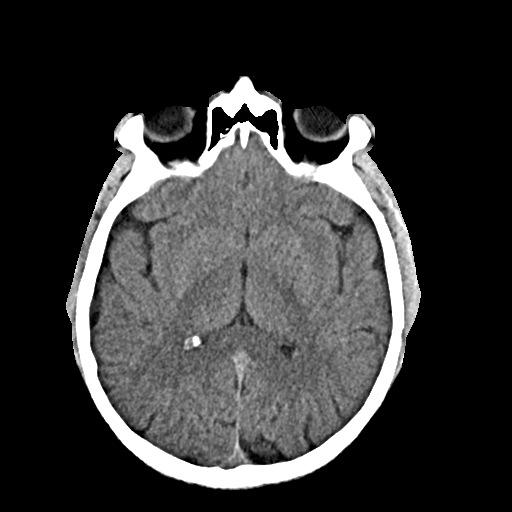
[im 18/35  bone]
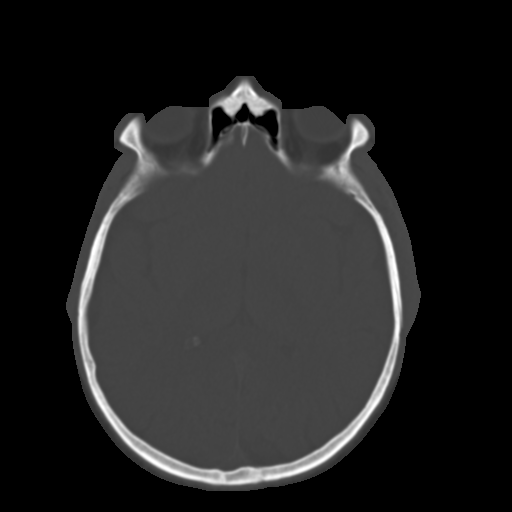
[im 20/35  brain]
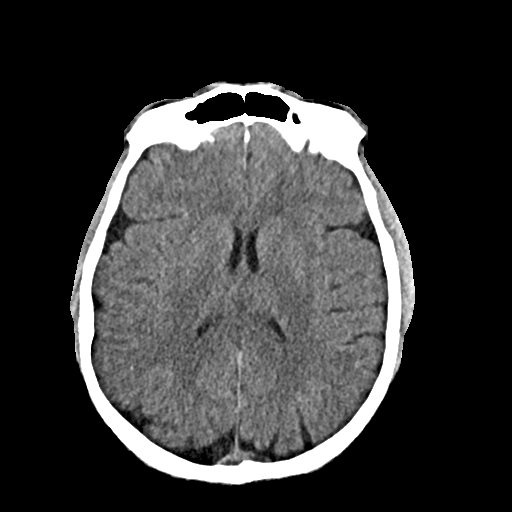
[im 25/35  brain]
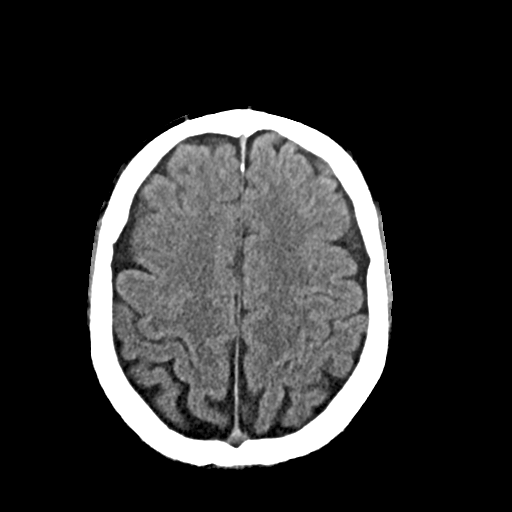
[im 27/35  brain]
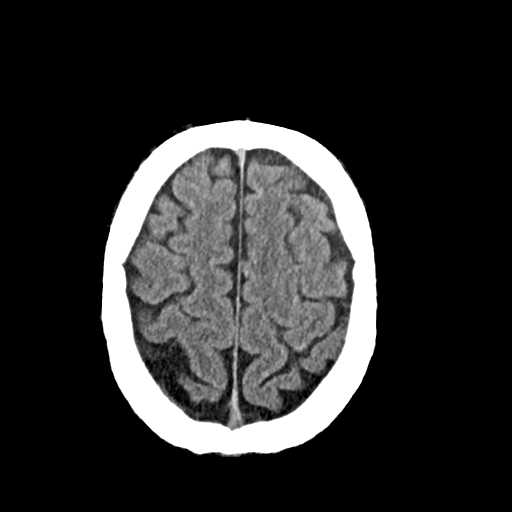
[im 32/35  brain]
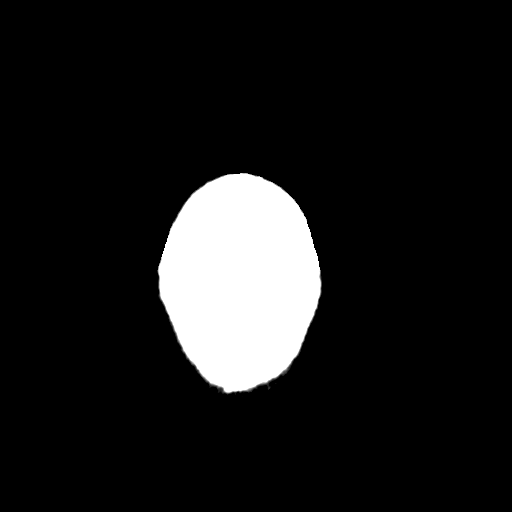
[im 32/35  bone]
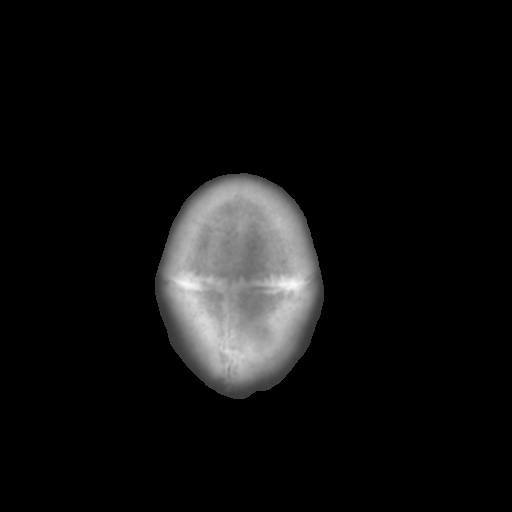

[Series 4: head 3.00 hr40 s3 sag · sagittal · 0.34mm/px · 3 of 61 slices shown]
[im 21/61  brain]
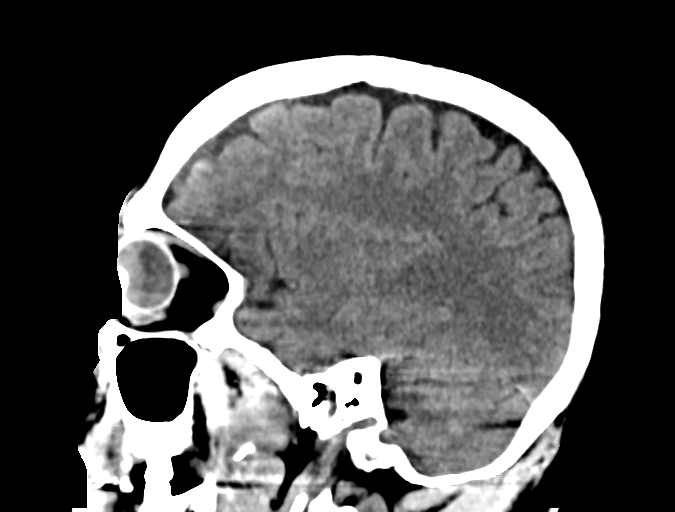
[im 31/61  brain]
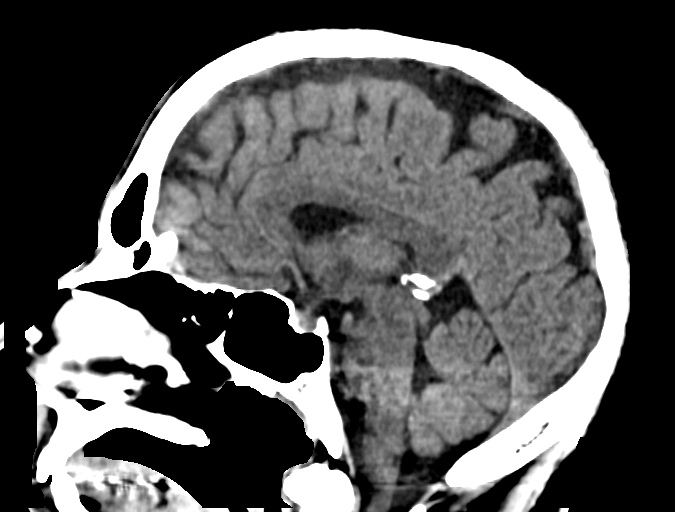
[im 41/61  brain]
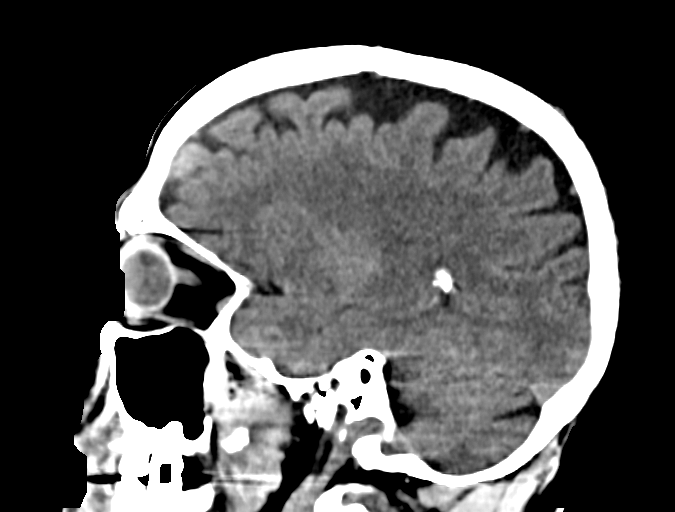

[Series 6: head 3.00 hr40 s3 cor · coronal · 0.34mm/px · 3 of 76 slices shown]
[im 26/76  brain]
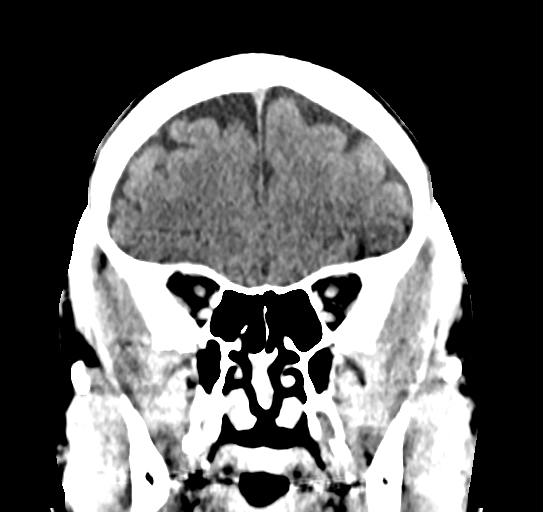
[im 34/76  brain]
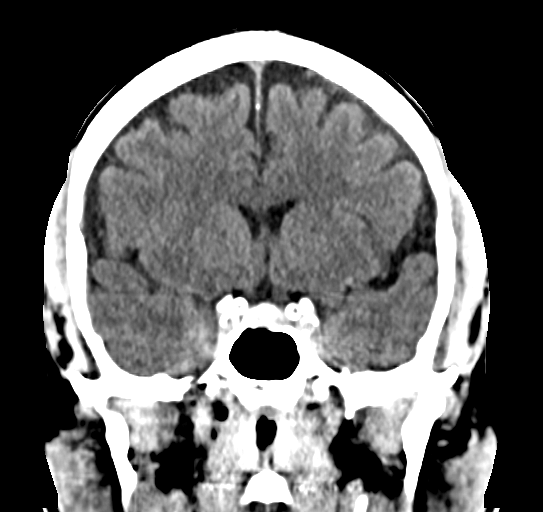
[im 42/76  brain]
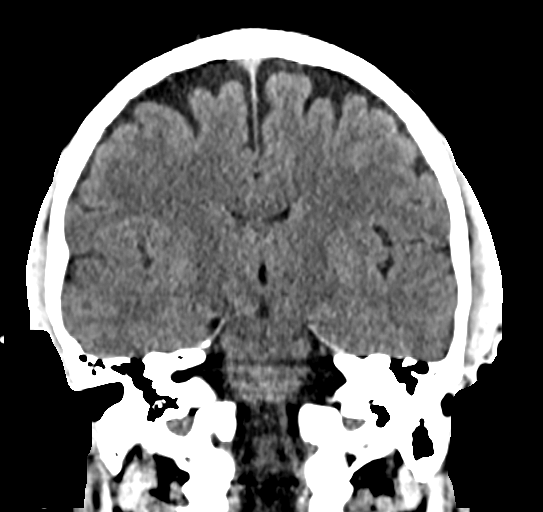

[15 of 47 positions shown; findings below may reference images not displayed]

FINDINGS: Brain: The brain itself shows mild age related volume loss. No
evidence of old or acute focal infarction, intra-axial mass lesion,
hemorrhage, hydrocephalus or extra-axial collection. Pineal cyst
with benign appearing calcification does not appear to have
enlarged. This measures 15 x 9 x 13 mm.

Vascular: There is atherosclerotic calcification of the major
vessels at the base of the brain.

Skull: Negative

Sinuses/Orbits: Clear/normal

Other: None
IMPRESSION: No acute finding or clear explanation for the clinical presentation.
Mild generalized age related volume loss.

Chronic pineal cyst with benign appearing wall calcification does
not show enlargement.

There is atherosclerotic calcification of the major vessels at the
base of the brain.

## 2019-06-13 DIAGNOSIS — M25521 Pain in right elbow: Secondary | ICD-10-CM | POA: Diagnosis not present

## 2019-06-14 ENCOUNTER — Other Ambulatory Visit: Payer: Self-pay | Admitting: Gastroenterology

## 2019-06-14 DIAGNOSIS — Z8619 Personal history of other infectious and parasitic diseases: Secondary | ICD-10-CM | POA: Diagnosis not present

## 2019-06-14 DIAGNOSIS — K219 Gastro-esophageal reflux disease without esophagitis: Secondary | ICD-10-CM | POA: Diagnosis not present

## 2019-06-14 DIAGNOSIS — R1013 Epigastric pain: Secondary | ICD-10-CM

## 2019-06-14 DIAGNOSIS — D66 Hereditary factor VIII deficiency: Secondary | ICD-10-CM | POA: Diagnosis not present

## 2019-06-20 ENCOUNTER — Other Ambulatory Visit: Payer: BLUE CROSS/BLUE SHIELD

## 2019-07-04 DIAGNOSIS — D68318 Other hemorrhagic disorder due to intrinsic circulating anticoagulants, antibodies, or inhibitors: Secondary | ICD-10-CM | POA: Diagnosis not present

## 2019-07-04 DIAGNOSIS — R7989 Other specified abnormal findings of blood chemistry: Secondary | ICD-10-CM | POA: Diagnosis not present

## 2019-07-04 DIAGNOSIS — Z8619 Personal history of other infectious and parasitic diseases: Secondary | ICD-10-CM | POA: Diagnosis not present

## 2019-07-04 DIAGNOSIS — Z87448 Personal history of other diseases of urinary system: Secondary | ICD-10-CM | POA: Diagnosis not present

## 2019-07-04 DIAGNOSIS — D66 Hereditary factor VIII deficiency: Secondary | ICD-10-CM | POA: Diagnosis not present

## 2019-07-04 DIAGNOSIS — E291 Testicular hypofunction: Secondary | ICD-10-CM | POA: Diagnosis not present

## 2019-07-04 DIAGNOSIS — Z125 Encounter for screening for malignant neoplasm of prostate: Secondary | ICD-10-CM | POA: Diagnosis not present

## 2019-07-05 ENCOUNTER — Ambulatory Visit
Admission: RE | Admit: 2019-07-05 | Discharge: 2019-07-05 | Disposition: A | Discharge status: Home / Self Care | Payer: BC Managed Care – PPO | Source: Ambulatory Visit | Attending: Gastroenterology | Admitting: Gastroenterology

## 2019-07-05 DIAGNOSIS — R1013 Epigastric pain: Secondary | ICD-10-CM

## 2019-07-05 DIAGNOSIS — R109 Unspecified abdominal pain: Secondary | ICD-10-CM | POA: Diagnosis not present

## 2019-07-11 DIAGNOSIS — M25521 Pain in right elbow: Secondary | ICD-10-CM | POA: Diagnosis not present

## 2019-07-19 ENCOUNTER — Encounter: Payer: Self-pay | Admitting: General Practice

## 2019-08-15 ENCOUNTER — Telehealth: Payer: Self-pay | Admitting: Neurology

## 2019-08-15 DIAGNOSIS — D68318 Other hemorrhagic disorder due to intrinsic circulating anticoagulants, antibodies, or inhibitors: Secondary | ICD-10-CM | POA: Diagnosis not present

## 2019-08-15 DIAGNOSIS — R7989 Other specified abnormal findings of blood chemistry: Secondary | ICD-10-CM | POA: Diagnosis not present

## 2019-08-15 DIAGNOSIS — E291 Testicular hypofunction: Secondary | ICD-10-CM | POA: Diagnosis not present

## 2019-08-15 DIAGNOSIS — F411 Generalized anxiety disorder: Secondary | ICD-10-CM | POA: Diagnosis not present

## 2019-08-15 NOTE — Telephone Encounter (Signed)
I called patient and LVM regarding 2/16 appointment with Dr. Anne Hahn. Appointment was cancelled because it conflicted with provider's NCV/EMG schedule. I requested patient call back to reschedule.

## 2019-08-16 ENCOUNTER — Telehealth: Payer: Self-pay | Admitting: Neurology

## 2019-08-16 NOTE — Telephone Encounter (Signed)
Pt has been scheduled for 03-06-2020 @3 :30pm.  This is FYI

## 2019-08-16 NOTE — Telephone Encounter (Signed)
Pt has called to r/s his 6 month f/u.  Pt was told he was to f/u with Maralyn Sago, NP at the end of January.  Pt states he did not want to see Maralyn Sago, NP.  Pt said it is nothing against Maralyn Sago, NP but he would like to see Dr Anne Hahn at least 1 last time before he goes into retirement.  Pt asked to be schduled with Dr Paulino Rily after being told that we try and honor what is entered into the system re: who pt needs to see).  Pt asked to see Dr Anne Hahn one last time on any day but a Thurs and that it also be a late afternoon appointment.

## 2019-08-16 NOTE — Telephone Encounter (Signed)
Noted  

## 2019-08-23 DIAGNOSIS — L281 Prurigo nodularis: Secondary | ICD-10-CM | POA: Diagnosis not present

## 2019-08-23 DIAGNOSIS — L738 Other specified follicular disorders: Secondary | ICD-10-CM | POA: Diagnosis not present

## 2019-08-23 DIAGNOSIS — L0889 Other specified local infections of the skin and subcutaneous tissue: Secondary | ICD-10-CM | POA: Diagnosis not present

## 2019-08-25 DIAGNOSIS — R1032 Left lower quadrant pain: Secondary | ICD-10-CM | POA: Diagnosis not present

## 2019-08-25 DIAGNOSIS — K760 Fatty (change of) liver, not elsewhere classified: Secondary | ICD-10-CM | POA: Diagnosis not present

## 2019-08-25 DIAGNOSIS — I700 Atherosclerosis of aorta: Secondary | ICD-10-CM | POA: Diagnosis not present

## 2019-08-25 DIAGNOSIS — N2889 Other specified disorders of kidney and ureter: Secondary | ICD-10-CM | POA: Diagnosis not present

## 2019-08-25 DIAGNOSIS — R109 Unspecified abdominal pain: Secondary | ICD-10-CM | POA: Diagnosis not present

## 2019-09-01 DIAGNOSIS — R1032 Left lower quadrant pain: Secondary | ICD-10-CM | POA: Diagnosis not present

## 2019-09-01 DIAGNOSIS — R110 Nausea: Secondary | ICD-10-CM | POA: Diagnosis not present

## 2019-09-01 DIAGNOSIS — R7309 Other abnormal glucose: Secondary | ICD-10-CM | POA: Diagnosis not present

## 2019-09-01 DIAGNOSIS — E559 Vitamin D deficiency, unspecified: Secondary | ICD-10-CM | POA: Diagnosis not present

## 2019-09-19 ENCOUNTER — Ambulatory Visit: Payer: BC Managed Care – PPO | Admitting: Neurology

## 2019-09-25 DIAGNOSIS — M722 Plantar fascial fibromatosis: Secondary | ICD-10-CM | POA: Diagnosis not present

## 2019-09-25 DIAGNOSIS — M25572 Pain in left ankle and joints of left foot: Secondary | ICD-10-CM | POA: Diagnosis not present

## 2019-09-25 DIAGNOSIS — M19072 Primary osteoarthritis, left ankle and foot: Secondary | ICD-10-CM | POA: Diagnosis not present

## 2019-09-25 DIAGNOSIS — M25571 Pain in right ankle and joints of right foot: Secondary | ICD-10-CM | POA: Diagnosis not present

## 2019-09-26 DIAGNOSIS — M545 Low back pain: Secondary | ICD-10-CM | POA: Diagnosis not present

## 2019-10-09 DIAGNOSIS — M545 Low back pain: Secondary | ICD-10-CM | POA: Diagnosis not present

## 2019-10-12 DIAGNOSIS — F411 Generalized anxiety disorder: Secondary | ICD-10-CM | POA: Diagnosis not present

## 2019-10-12 DIAGNOSIS — R002 Palpitations: Secondary | ICD-10-CM | POA: Diagnosis not present

## 2019-10-12 DIAGNOSIS — E291 Testicular hypofunction: Secondary | ICD-10-CM | POA: Diagnosis not present

## 2019-10-13 ENCOUNTER — Ambulatory Visit: Payer: BC Managed Care – PPO | Attending: Internal Medicine

## 2019-10-13 DIAGNOSIS — Z23 Encounter for immunization: Secondary | ICD-10-CM

## 2019-10-13 NOTE — Progress Notes (Signed)
   [id number]  Name:  Darrell Graham.    MRN: [id number] DOB: 11-05-1965  10/13/2019  Darrell Graham was observed post Covid-19 immunization for 15 minutes without incident. He was provided with Vaccine Information Sheet and instruction to access the V-Safe system.   Darrell Graham was instructed to call 911 with any severe reactions post vaccine: Marland Kitchen Difficulty breathing  . Swelling of face and throat  . A fast heartbeat  . A bad rash all over body  . Dizziness and weakness   Immunizations Administered    Name Date Dose VIS Date Route   Pfizer COVID-19 Vaccine 10/13/2019  4:11 PM 0.3 mL 07/14/2019 Intramuscular   Manufacturer: [employer], [employer]   Lot: [id number]   NDC: [address]-7

## 2019-10-16 ENCOUNTER — Telehealth: Payer: Self-pay | Admitting: *Deleted

## 2019-10-16 ENCOUNTER — Other Ambulatory Visit: Payer: Self-pay | Admitting: *Deleted

## 2019-10-16 DIAGNOSIS — R002 Palpitations: Secondary | ICD-10-CM

## 2019-10-16 NOTE — Telephone Encounter (Signed)
Patient called.  ZIO XT long term holter monitor explained.  Patient prefers to hold off for now.  He will [employer] when he is ready to have monitor mailed to him.

## 2019-10-17 NOTE — Telephone Encounter (Signed)
error 

## 2019-10-25 DIAGNOSIS — H40019 Open angle with borderline findings, low risk, unspecified eye: Secondary | ICD-10-CM | POA: Diagnosis not present

## 2019-11-08 ENCOUNTER — Ambulatory Visit: Payer: BC Managed Care – PPO | Attending: Internal Medicine

## 2019-11-08 DIAGNOSIS — Z23 Encounter for immunization: Secondary | ICD-10-CM

## 2019-11-08 NOTE — Progress Notes (Signed)
   [id number]  Name:  Darrell Graham.    MRN: [id number] DOB: 12-08-1965  11/08/2019  [name] was observed post Covid-19 immunization for 15 minutes without incident. He was provided with Vaccine Information Sheet and instruction to access the V-Safe system.   [name] was instructed to call 911 with any severe reactions post vaccine: Marland Kitchen Difficulty breathing  . Swelling of face and throat  . A fast heartbeat  . A bad rash all over body  . Dizziness and weakness   Immunizations Administered    Name Date Dose VIS Date Route   Pfizer COVID-19 Vaccine 11/08/2019  4:15 PM 0.3 mL 07/14/2019 Intramuscular   Manufacturer: [employer], [employer]   Lot: [id number]   NDC: [address]-9

## 2020-01-09 ENCOUNTER — Telehealth: Payer: Self-pay | Admitting: Neurology

## 2020-01-09 DIAGNOSIS — H40059 Ocular hypertension, unspecified eye: Secondary | ICD-10-CM | POA: Diagnosis not present

## 2020-01-09 NOTE — Telephone Encounter (Signed)
Phone rep checked office voicemail's,at 3:58 pt left message MM:CRFV & swelling on left side of ear & cheek pressue and headache pain for last few weeks.  Pt would like a call from RN to discuss

## 2020-01-10 NOTE — Telephone Encounter (Signed)
I called pt and he stated that he has had some left eye/ ear, cheek swelling , stabbing pains, also headache pain.  I relayed that since he has those sx then to see pcp acutely for evaluation to r/o infection/ cellulitis, if pcp thinks we need to address then to let us know. Pt verbalized understanding.

## 2020-01-19 DIAGNOSIS — E559 Vitamin D deficiency, unspecified: Secondary | ICD-10-CM | POA: Diagnosis not present

## 2020-01-19 DIAGNOSIS — R7989 Other specified abnormal findings of blood chemistry: Secondary | ICD-10-CM | POA: Diagnosis not present

## 2020-01-19 DIAGNOSIS — L309 Dermatitis, unspecified: Secondary | ICD-10-CM | POA: Diagnosis not present

## 2020-01-19 DIAGNOSIS — R945 Abnormal results of liver function studies: Secondary | ICD-10-CM | POA: Diagnosis not present

## 2020-01-19 DIAGNOSIS — R202 Paresthesia of skin: Secondary | ICD-10-CM | POA: Diagnosis not present

## 2020-01-19 DIAGNOSIS — L089 Local infection of the skin and subcutaneous tissue, unspecified: Secondary | ICD-10-CM | POA: Diagnosis not present

## 2020-01-19 DIAGNOSIS — Z79899 Other long term (current) drug therapy: Secondary | ICD-10-CM | POA: Diagnosis not present

## 2020-01-19 DIAGNOSIS — R252 Cramp and spasm: Secondary | ICD-10-CM | POA: Diagnosis not present

## 2020-01-26 DIAGNOSIS — L282 Other prurigo: Secondary | ICD-10-CM | POA: Diagnosis not present

## 2020-01-26 DIAGNOSIS — L600 Ingrowing nail: Secondary | ICD-10-CM | POA: Diagnosis not present

## 2020-01-26 DIAGNOSIS — M19072 Primary osteoarthritis, left ankle and foot: Secondary | ICD-10-CM | POA: Diagnosis not present

## 2020-02-21 DIAGNOSIS — L600 Ingrowing nail: Secondary | ICD-10-CM | POA: Diagnosis not present

## 2020-02-21 DIAGNOSIS — M79672 Pain in left foot: Secondary | ICD-10-CM | POA: Diagnosis not present

## 2020-02-26 DIAGNOSIS — R945 Abnormal results of liver function studies: Secondary | ICD-10-CM | POA: Diagnosis not present

## 2020-03-06 ENCOUNTER — Encounter: Payer: Self-pay | Admitting: Neurology

## 2020-03-06 ENCOUNTER — Ambulatory Visit: Payer: BC Managed Care – PPO | Admitting: Neurology

## 2020-03-06 DIAGNOSIS — G44221 Chronic tension-type headache, intractable: Secondary | ICD-10-CM

## 2020-03-06 DIAGNOSIS — F428 Other obsessive-compulsive disorder: Secondary | ICD-10-CM

## 2020-03-06 MED ORDER — NORTRIPTYLINE HCL 10 MG PO CAPS
ORAL_CAPSULE | ORAL | 3 refills | Status: DC
Start: 2020-03-06 — End: 2020-09-10

## 2020-03-06 NOTE — Progress Notes (Signed)
Reason for visit: OCD disorder, tension headache  Darrell Grassel. is an 54 y.o. male  History of present illness:  Darrell Graham is a 54 year old right handed white male with a history of an OCD disorder and anxiety problems.  Darrell Graham indicates that he was having some panic attacks on occasion.  Darrell Graham is having constant problems with head pressure throughout Darrell head, he still is scratching areas on his head leaving sores.  Darrell head scratching is mainly on Darrell left side, he cannot lay on Darrell left in part because of this.  He has been tried in Darrell past on Lexapro but did not tolerate it, he did not tolerate Darrell mirtazapine, he never took Effexor, he was given a brief trial on low-dose gabapentin.  Darrell Graham is not sleeping well at night he usually.  He returns to Darrell office today for an evaluation.  Past Medical History:  Diagnosis Date  . Allergy   . Asthma   . Celiac disease   . Chronic arthropathy    left ankle & right elbow  . Deviated nasal septum   . Diverticulitis   . Glaucoma   . H/O vitamin D deficiency   . 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 Hepatitis C virus    recent viral titer negative  . Hernia   . High blood pressure   . History of pineal cyst 08/26/2018  . OCD (obsessive compulsive disorder) 07/01/2015  . Scoliosis   . Telangiectasias    upper back and chest  . UTI (urinary tract infection) 09/2015    Past Surgical History:  Procedure Laterality Date  . ANKLE ARTHROSCOPY  1996  . BLADDER SURGERY  2017  . SCROTAL SURGERY    . TRIGGER FINGER RELEASE Left    March 2018  . URETHRA SURGERY     June 2018- dilation of urethra     Family History  Problem Relation Age of Onset  . Dementia Mother   . Migraines Father   . Stroke Father   . Heart disease Father   . Hemophilia Other   . Hepatitis C Other   . Osteopenia Other   . Psoriasis Other   . GER disease Other     Social history:  reports that he has never smoked. He has never used smokeless  tobacco. He reports that he does not drink alcohol and does not use drugs.    Allergies  Allergen Reactions  . Aspirin Other (See Comments)    Bleeding   . Hemophilia Support [Acid Blockers Support] Other (See Comments)    Hemophilia Factor VIII  . Codeine Itching and Rash  . Gluten Meal Diarrhea, Itching and Rash    General lethargy  . Tetracycline Hcl Itching and Rash  . Tetracyclines & Related Itching and Rash  . Wheat Bran Diarrhea, Itching and Rash    General lethargy    Medications:  Prior to Admission medications   Medication Sig Start Date End Date Taking? Authorizing Provider  ALPRAZolam Duanne Moron) 0.25 MG tablet Take 0.25 mg by mouth at bedtime as needed for anxiety.   Yes [provider]  amLODipine (NORVASC) 5 MG tablet Take 5 mg by mouth daily.   Yes [provider]  Antihemophilic Factor rAHF-PAF (XYNTHA SOLOFUSE) 3000 UNITS KIT Inject 3,000 Units into Darrell vein as needed. Graham taking differently: Inject 3,000 Units into Darrell vein as needed (for bleeding).  07/22/15  Yes Ladell Pier, MD  celecoxib (CELEBREX) 100  MG capsule celecoxib 100 mg capsule  TAKE 1 CAPSULE BY MOUTH TWICE DAILY   Yes [provider]  Dapsone (ACZONE EX) Apply 1 application topically daily as needed (for acne).    Yes [provider]  Emicizumab-kxwh [facility]) Inject into Darrell skin.   Yes [provider]  hydrochlorothiazide (MICROZIDE) 12.5 MG capsule Take 12.5 mg by mouth daily.  11/25/15  Yes [provider]  HYDROcodone-acetaminophen (NORCO/VICODIN) 5-325 MG tablet Take 1-2 tablets by mouth every 6 (six) hours as needed for moderate pain or severe pain. 09/27/15  Yes Domenic Polite, MD  lidocaine (LIDODERM) 5 % lidocaine 5 % topical patch  APPLY 1 2 PATCH TO INTACT SKIN ONCE A DAY. REMOVE AFTER 12 HOURS AS NEEDED   Yes [provider]  losartan (COZAAR) 50 MG tablet Take 50 mg by mouth daily.   Yes [provider]    Multiple Vitamins-Minerals (MULTIVITAMIN PO) Take 1 tablet by mouth daily.   Yes [provider]  Naphazoline-Pheniramine (OPCON-A) 0.027-0.315 % SOLN Apply 1 drop to eye daily as needed (for dry eyes).   Yes [provider]  pantoprazole (PROTONIX) 40 MG tablet Take 40 mg daily by mouth. 02/24/16  Yes [provider]    ROS:  Out of a complete 14 system review of symptoms, Darrell Graham complains only of Darrell following symptoms, and all other reviewed systems are negative.  Head scratching Anxiety Head pressure  There were no vitals taken for this visit.  Physical Exam  General: Darrell Graham is alert and cooperative at Darrell time of Darrell examination.  Skin: No significant peripheral edema is noted.   Neurologic Exam  Mental status: Darrell Graham is alert and oriented x 3 at Darrell time of Darrell examination. Darrell Graham has apparent normal recent and remote memory, with an apparently normal attention span and concentration ability.   Cranial nerves: Facial symmetry is present. Speech is normal, no aphasia or dysarthria is noted. Extraocular movements are full. Visual fields are full.  Motor: Darrell Graham has good strength in all 4 extremities.  Sensory examination: Soft touch sensation is symmetric on Darrell face, arms, and legs.  Coordination: Darrell Graham has good finger-nose-finger and heel-to-shin bilaterally.  Gait and station: Darrell Graham has a normal gait. Tandem gait is normal. Romberg is negative. No drift is seen.  Reflexes: Deep tendon reflexes are symmetric.   Assessment/Plan:  1.  OCD  2.  Tension headache  Darrell Graham is continued to have ongoing symptoms.  We will give him a trial on low-dose nortriptyline for his headache issue.  Hopefully this will help him sleep better.  He will follow-up in 6 months.  He will call for any dose adjustments.  Jill Alexanders MD 03/06/2020 4:03 PM  [facility] [address] [place], [place] [address]  Phone [phone] Fax [phone]

## 2020-03-06 NOTE — Patient Instructions (Signed)
We will start nortriptyline for the head pressure.  Pamelor (nortriptyline) is an antidepressant medication that has many uses that may include headache, whiplash injuries, or for peripheral neuropathy pain. Side effects may include drowsiness, dry mouth, blurred vision, or constipation. As with any antidepressant medication, worsening depression may occur. If you had any significant side effects, please call our office. The full effects of this medication may take 7-10 days after starting the drug, or going up on the dose.

## 2020-03-20 DIAGNOSIS — L309 Dermatitis, unspecified: Secondary | ICD-10-CM | POA: Diagnosis not present

## 2020-03-20 DIAGNOSIS — F418 Other specified anxiety disorders: Secondary | ICD-10-CM | POA: Diagnosis not present

## 2020-03-20 DIAGNOSIS — I10 Essential (primary) hypertension: Secondary | ICD-10-CM | POA: Diagnosis not present

## 2020-03-22 DIAGNOSIS — L0109 Other impetigo: Secondary | ICD-10-CM | POA: Diagnosis not present

## 2020-03-22 DIAGNOSIS — L0101 Non-bullous impetigo: Secondary | ICD-10-CM | POA: Diagnosis not present

## 2020-03-26 DIAGNOSIS — H93293 Other abnormal auditory perceptions, bilateral: Secondary | ICD-10-CM | POA: Diagnosis not present

## 2020-03-26 DIAGNOSIS — H9313 Tinnitus, bilateral: Secondary | ICD-10-CM | POA: Diagnosis not present

## 2020-03-26 DIAGNOSIS — M2669 Other specified disorders of temporomandibular joint: Secondary | ICD-10-CM | POA: Diagnosis not present

## 2020-03-26 DIAGNOSIS — R0981 Nasal congestion: Secondary | ICD-10-CM | POA: Diagnosis not present

## 2020-03-26 DIAGNOSIS — L03811 Cellulitis of head [any part, except face]: Secondary | ICD-10-CM | POA: Diagnosis not present

## 2020-03-27 DIAGNOSIS — J3489 Other specified disorders of nose and nasal sinuses: Secondary | ICD-10-CM | POA: Diagnosis not present

## 2020-03-27 DIAGNOSIS — J321 Chronic frontal sinusitis: Secondary | ICD-10-CM | POA: Diagnosis not present

## 2020-03-27 DIAGNOSIS — J322 Chronic ethmoidal sinusitis: Secondary | ICD-10-CM | POA: Diagnosis not present

## 2020-03-27 DIAGNOSIS — J342 Deviated nasal septum: Secondary | ICD-10-CM | POA: Diagnosis not present

## 2020-03-27 DIAGNOSIS — R0981 Nasal congestion: Secondary | ICD-10-CM | POA: Diagnosis not present

## 2020-04-09 DIAGNOSIS — M65331 Trigger finger, right middle finger: Secondary | ICD-10-CM | POA: Diagnosis not present

## 2020-04-09 DIAGNOSIS — G5601 Carpal tunnel syndrome, right upper limb: Secondary | ICD-10-CM | POA: Diagnosis not present

## 2020-04-11 DIAGNOSIS — L0889 Other specified local infections of the skin and subcutaneous tissue: Secondary | ICD-10-CM | POA: Diagnosis not present

## 2020-04-11 DIAGNOSIS — L309 Dermatitis, unspecified: Secondary | ICD-10-CM | POA: Diagnosis not present

## 2020-04-24 DIAGNOSIS — H40023 Open angle with borderline findings, high risk, bilateral: Secondary | ICD-10-CM | POA: Diagnosis not present

## 2020-04-24 DIAGNOSIS — H5212 Myopia, left eye: Secondary | ICD-10-CM | POA: Diagnosis not present

## 2020-04-24 DIAGNOSIS — H40043 Steroid responder, bilateral: Secondary | ICD-10-CM | POA: Diagnosis not present

## 2020-04-24 DIAGNOSIS — H40053 Ocular hypertension, bilateral: Secondary | ICD-10-CM | POA: Diagnosis not present

## 2020-05-10 DIAGNOSIS — G5601 Carpal tunnel syndrome, right upper limb: Secondary | ICD-10-CM | POA: Diagnosis not present

## 2020-05-10 DIAGNOSIS — M65341 Trigger finger, right ring finger: Secondary | ICD-10-CM | POA: Diagnosis not present

## 2020-05-10 DIAGNOSIS — M65331 Trigger finger, right middle finger: Secondary | ICD-10-CM | POA: Diagnosis not present

## 2020-05-15 DIAGNOSIS — D66 Hereditary factor VIII deficiency: Secondary | ICD-10-CM | POA: Diagnosis not present

## 2020-05-15 DIAGNOSIS — L309 Dermatitis, unspecified: Secondary | ICD-10-CM | POA: Diagnosis not present

## 2020-05-21 DIAGNOSIS — M25521 Pain in right elbow: Secondary | ICD-10-CM | POA: Diagnosis not present

## 2020-05-31 DIAGNOSIS — M79641 Pain in right hand: Secondary | ICD-10-CM | POA: Diagnosis not present

## 2020-06-05 DIAGNOSIS — M79641 Pain in right hand: Secondary | ICD-10-CM | POA: Diagnosis not present

## 2020-06-12 DIAGNOSIS — M79641 Pain in right hand: Secondary | ICD-10-CM | POA: Diagnosis not present

## 2020-06-18 DIAGNOSIS — M79641 Pain in right hand: Secondary | ICD-10-CM | POA: Diagnosis not present

## 2020-06-25 DIAGNOSIS — M79641 Pain in right hand: Secondary | ICD-10-CM | POA: Diagnosis not present

## 2020-07-10 DIAGNOSIS — Z03818 Encounter for observation for suspected exposure to other biological agents ruled out: Secondary | ICD-10-CM | POA: Diagnosis not present

## 2020-07-10 DIAGNOSIS — R509 Fever, unspecified: Secondary | ICD-10-CM | POA: Diagnosis not present

## 2020-07-10 DIAGNOSIS — L089 Local infection of the skin and subcutaneous tissue, unspecified: Secondary | ICD-10-CM | POA: Diagnosis not present

## 2020-07-10 DIAGNOSIS — R112 Nausea with vomiting, unspecified: Secondary | ICD-10-CM | POA: Diagnosis not present

## 2020-07-11 DIAGNOSIS — E291 Testicular hypofunction: Secondary | ICD-10-CM | POA: Diagnosis not present

## 2020-07-11 DIAGNOSIS — Z125 Encounter for screening for malignant neoplasm of prostate: Secondary | ICD-10-CM | POA: Diagnosis not present

## 2020-07-11 DIAGNOSIS — Z8719 Personal history of other diseases of the digestive system: Secondary | ICD-10-CM | POA: Diagnosis not present

## 2020-07-11 DIAGNOSIS — N321 Vesicointestinal fistula: Secondary | ICD-10-CM | POA: Diagnosis not present

## 2020-07-11 DIAGNOSIS — Z87448 Personal history of other diseases of urinary system: Secondary | ICD-10-CM | POA: Diagnosis not present

## 2020-07-11 DIAGNOSIS — R197 Diarrhea, unspecified: Secondary | ICD-10-CM | POA: Diagnosis not present

## 2020-07-17 DIAGNOSIS — M79641 Pain in right hand: Secondary | ICD-10-CM | POA: Diagnosis not present

## 2020-07-22 DIAGNOSIS — R112 Nausea with vomiting, unspecified: Secondary | ICD-10-CM | POA: Diagnosis not present

## 2020-07-22 DIAGNOSIS — K900 Celiac disease: Secondary | ICD-10-CM | POA: Diagnosis not present

## 2020-07-22 DIAGNOSIS — R1310 Dysphagia, unspecified: Secondary | ICD-10-CM | POA: Diagnosis not present

## 2020-07-22 DIAGNOSIS — R197 Diarrhea, unspecified: Secondary | ICD-10-CM | POA: Diagnosis not present

## 2020-07-22 DIAGNOSIS — R1032 Left lower quadrant pain: Secondary | ICD-10-CM | POA: Diagnosis not present

## 2020-07-23 DIAGNOSIS — R197 Diarrhea, unspecified: Secondary | ICD-10-CM | POA: Diagnosis not present

## 2020-07-23 DIAGNOSIS — R1032 Left lower quadrant pain: Secondary | ICD-10-CM | POA: Diagnosis not present

## 2020-07-25 DIAGNOSIS — L600 Ingrowing nail: Secondary | ICD-10-CM | POA: Diagnosis not present

## 2020-07-30 DIAGNOSIS — M79641 Pain in right hand: Secondary | ICD-10-CM | POA: Diagnosis not present

## 2020-07-31 DIAGNOSIS — R109 Unspecified abdominal pain: Secondary | ICD-10-CM | POA: Diagnosis not present

## 2020-07-31 DIAGNOSIS — R1032 Left lower quadrant pain: Secondary | ICD-10-CM | POA: Diagnosis not present

## 2020-07-31 DIAGNOSIS — K760 Fatty (change of) liver, not elsewhere classified: Secondary | ICD-10-CM | POA: Diagnosis not present

## 2020-07-31 DIAGNOSIS — K5792 Diverticulitis of intestine, part unspecified, without perforation or abscess without bleeding: Secondary | ICD-10-CM | POA: Diagnosis not present

## 2020-08-07 DIAGNOSIS — L0889 Other specified local infections of the skin and subcutaneous tissue: Secondary | ICD-10-CM | POA: Diagnosis not present

## 2020-08-07 DIAGNOSIS — L738 Other specified follicular disorders: Secondary | ICD-10-CM | POA: Diagnosis not present

## 2020-08-07 DIAGNOSIS — L723 Sebaceous cyst: Secondary | ICD-10-CM | POA: Diagnosis not present

## 2020-08-09 DIAGNOSIS — M79641 Pain in right hand: Secondary | ICD-10-CM | POA: Diagnosis not present

## 2020-08-29 DIAGNOSIS — H40053 Ocular hypertension, bilateral: Secondary | ICD-10-CM | POA: Diagnosis not present

## 2020-09-03 DIAGNOSIS — K130 Diseases of lips: Secondary | ICD-10-CM | POA: Diagnosis not present

## 2020-09-03 DIAGNOSIS — L0109 Other impetigo: Secondary | ICD-10-CM | POA: Diagnosis not present

## 2020-09-04 DIAGNOSIS — M79641 Pain in right hand: Secondary | ICD-10-CM | POA: Diagnosis not present

## 2020-09-10 ENCOUNTER — Other Ambulatory Visit: Payer: Self-pay

## 2020-09-10 ENCOUNTER — Encounter: Payer: Self-pay | Admitting: Neurology

## 2020-09-10 ENCOUNTER — Ambulatory Visit (INDEPENDENT_AMBULATORY_CARE_PROVIDER_SITE_OTHER): Payer: BC Managed Care – PPO | Admitting: Neurology

## 2020-09-10 VITALS — BP 155/97 | HR 100 | Ht 70.0 in | Wt 180.0 lb

## 2020-09-10 DIAGNOSIS — F428 Other obsessive-compulsive disorder: Secondary | ICD-10-CM | POA: Diagnosis not present

## 2020-09-10 DIAGNOSIS — Z8639 Personal history of other endocrine, nutritional and metabolic disease: Secondary | ICD-10-CM | POA: Diagnosis not present

## 2020-09-10 MED ORDER — NORTRIPTYLINE HCL 10 MG PO CAPS: ORAL_CAPSULE | ORAL | 5 refills | Status: DC

## 2020-09-10 NOTE — Patient Instructions (Signed)
Start the nortriptyline, take 1 at bedtime for 1 week, then take 2 at bedtime  Will order MRI of the brain  See you back in 6 months

## 2020-09-10 NOTE — Progress Notes (Signed)
PATIENT: Darrell Graham. DOB: October 25, 1965  REASON FOR VISIT: follow up HISTORY FROM: patient  HISTORY OF PRESENT ILLNESS: Today 09/10/20 Darrell Graham is a 55 year old male with history of OCD disorder and anxiety.  He has been prescribed multiple medications from our office, but usually does not take them, due to fear of side effect or reports intolerance, including Lexapro, Effexor, gabapentin, mirtazapine and nortriptyline.  He has constant issues scratching his head, leaves sores behind.  Is currently being treated with an oral antibiotic for staph infection.  He sees dermatology.  He does not sleep well at night.  Claims he has pain to the left side of his head when lying on the left side.  Under more stress, caring for his mother who has dementia.  He works part-time doing yard work.  He is worried about his pineal cyst that has been stable, and appears benign.  He presents today for evaluation unaccompanied.  HISTORY 03/06/2020 Dr. Jannifer Franklin: Darrell Graham is a 55 year old right handed white male with a history of an OCD disorder and anxiety problems.  The patient indicates that he was having some panic attacks on occasion.  The patient is having constant problems with head pressure throughout the head, he still is scratching areas on his head leaving sores.  The head scratching is mainly on the left side, he cannot lay on the left in part because of this.  He has been tried in the past on Lexapro but did not tolerate it, he did not tolerate the mirtazapine, he never took Effexor, he was given a brief trial on low-dose gabapentin.  The patient is not sleeping well at night he usually.  He returns to the office today for an evaluation.  REVIEW OF SYSTEMS: Out of a complete 14 system review of symptoms, the patient complains only of the following symptoms, and all other reviewed systems are negative.  See HPI  ALLERGIES: Allergies  Allergen Reactions  . Aspirin Other (See Comments)     Bleeding   . Hemophilia Support [Acid Blockers Support] Other (See Comments)    Hemophilia Factor VIII  . Codeine Itching and Rash  . Gluten Meal Diarrhea, Itching and Rash    General lethargy  . Tetracycline Hcl Itching and Rash  . Tetracyclines & Related Itching and Rash  . Wheat Bran Diarrhea, Itching and Rash    General lethargy    HOME MEDICATIONS: Outpatient Medications Prior to Visit  Medication Sig Dispense Refill  . ALPRAZolam (XANAX) 0.25 MG tablet Take 0.25 mg by mouth at bedtime as needed for anxiety.    Marland Kitchen Antihemophilic Factor rAHF-PAF (XYNTHA SOLOFUSE) 3000 UNITS KIT Inject 3,000 Units into the vein as needed. (Patient taking differently: Inject 3,000 Units into the vein as needed (for bleeding).) 1 kit 1  . celecoxib (CELEBREX) 100 MG capsule celecoxib 100 mg capsule  TAKE 1 CAPSULE BY MOUTH TWICE DAILY    . Emicizumab-kxwh (HEMLIBRA [place]) Inject into the skin.    Marland Kitchen HYDROcodone-acetaminophen (NORCO/VICODIN) 5-325 MG tablet Take 1-2 tablets by mouth every 6 (six) hours as needed for moderate pain or severe pain. 20 tablet 0  . lidocaine (LIDODERM) 5 % lidocaine 5 % topical patch  APPLY 1 2 PATCH TO INTACT SKIN ONCE A DAY. REMOVE AFTER 12 HOURS AS NEEDED    . Naphazoline-Pheniramine 0.027-0.315 % SOLN Apply 1 drop to eye daily as needed (for dry eyes).    . pantoprazole (PROTONIX) 40 MG tablet Take 40 mg daily by  mouth.    . amLODipine (NORVASC) 5 MG tablet Take 5 mg by mouth daily.    . Dapsone (ACZONE EX) Apply 1 application topically daily as needed (for acne).     . hydrochlorothiazide (MICROZIDE) 12.5 MG capsule Take 12.5 mg by mouth daily.   3  . losartan (COZAAR) 50 MG tablet Take 50 mg by mouth daily.    . Multiple Vitamins-Minerals (MULTIVITAMIN PO) Take 1 tablet by mouth daily.    . nortriptyline (PAMELOR) 10 MG capsule Take one capsule at night for one week, then take 2 capsules at night for one week, then take 3 capsules at night 90 capsule 3   No  facility-administered medications prior to visit.    PAST MEDICAL HISTORY: Past Medical History:  Diagnosis Date  . Allergy   . Asthma   . Celiac disease   . Chronic arthropathy    left ankle & right elbow  . Deviated nasal septum   . Diverticulitis   . Glaucoma   . H/O vitamin D deficiency   . 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 Hepatitis C virus    recent viral titer negative  . Hernia   . High blood pressure   . History of pineal cyst 08/26/2018  . OCD (obsessive compulsive disorder) 07/01/2015  . Scoliosis   . Telangiectasias    upper back and chest  . UTI (urinary tract infection) 09/2015    PAST SURGICAL HISTORY: Past Surgical History:  Procedure Laterality Date  . ANKLE ARTHROSCOPY  1996  . BLADDER SURGERY  2017  . SCROTAL SURGERY    . TRIGGER FINGER RELEASE Left    March 2018  . URETHRA SURGERY     June 2018- dilation of urethra     FAMILY HISTORY: Family History  Problem Relation Age of Onset  . Dementia Mother   . Migraines Father   . Stroke Father   . Heart disease Father   . Hemophilia Other   . Hepatitis C Other   . Osteopenia Other   . Psoriasis Other   . GER disease Other     SOCIAL HISTORY: Social History   Socioeconomic History  . Marital status: Single    Spouse name: Not on file  . Number of children: 0  . Years of education: 18  . Highest education level: Not on file  Occupational History    Comment: Not working at this time.  Tobacco Use  . Smoking status: Never Smoker  . Smokeless tobacco: Never Used  Substance and Sexual Activity  . Alcohol use: No  . Drug use: No  . Sexual activity: Not on file  Other Topics Concern  . Not on file  Social History Narrative   Patient is single and lives at home with his mother. Patient has a high school education.   Caffeine- 1-2 daily.   Right handed.   Social Determinants of Health   Financial Resource Strain: Not on file  Food Insecurity: Not on file  Transportation Needs: Not on file   Physical Activity: Not on file  Stress: Not on file  Social Connections: Not on file  Intimate Partner Violence: Not on file   PHYSICAL EXAM  Vitals:   09/10/20 1553  BP: (!) 155/97  Pulse: 100  Weight: 180 lb (81.6 kg)  Height: _0  (1.778 m)   Body mass index is 25.83 kg/m.  Generalized: Well developed, in no acute distress   Neurological examination  Mentation: Alert oriented to time, place,  history taking. Follows all commands speech and language fluent Cranial nerve II-XII: Pupils were equal round reactive to light. Extraocular movements were full, visual field were full on confrontational test. Facial sensation and strength were normal. Head turning and shoulder shrug were normal and symmetric. Motor: The motor testing reveals 5 over 5 strength of all 4 extremities. Good symmetric motor tone is noted throughout.  Sensory: Sensory testing is intact to soft touch on all 4 extremities. No evidence of extinction is noted.  Coordination: Cerebellar testing reveals good finger-nose-finger and heel-to-shin bilaterally.  Gait and station: Gait is normal. Reflexes: Deep tendon reflexes are symmetric and normal bilaterally.   DIAGNOSTIC DATA (LABS, IMAGING, TESTING) - I reviewed patient records, labs, notes, testing and imaging myself where available.  Lab Results  Component Value Date   WBC 4.1 09/26/2015   HGB 10.4 (L) 09/26/2015   HCT 31.6 (L) 09/26/2015   MCV 91.1 09/26/2015   PLT 326 09/26/2015      Component Value Date/Time   NA 141 09/23/2015 0152   NA 140 06/19/2013 1316   K 3.6 09/23/2015 0152   K 3.9 06/19/2013 1316   CL 108 09/23/2015 0152   CO2 25 09/23/2015 0152   CO2 24 06/19/2013 1316   GLUCOSE 121 (H) 09/23/2015 0152   GLUCOSE 117 06/19/2013 1316   BUN 7 09/23/2015 0152   BUN 17.2 06/19/2013 1316   CREATININE 0.79 09/23/2015 0152   CREATININE 0.9 06/19/2013 1316   CALCIUM 8.6 (L) 09/23/2015 0152   CALCIUM 10.2 06/19/2013 1316   PROT 6.7  09/23/2015 0152   PROT 8.1 06/19/2013 1316   ALBUMIN 3.1 (L) 09/23/2015 0152   ALBUMIN 4.7 06/19/2013 1316   AST 16 09/23/2015 0152   AST 17 06/19/2013 1316   ALT 11 (L) 09/23/2015 0152   ALT 10 06/19/2013 1316   ALKPHOS 53 09/23/2015 0152   ALKPHOS 72 06/19/2013 1316   BILITOT 0.6 09/23/2015 0152   BILITOT 0.63 06/19/2013 1316   GFRNONAA >60 09/23/2015 0152   GFRAA >60 09/23/2015 0152   No results found for: CHOL, HDL, LDLCALC, LDLDIRECT, TRIG, CHOLHDL No results found for: HGBA1C No results found for: VITAMINB12 No results found for: TSH  ASSESSMENT AND PLAN 55 y.o. 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 here with:  1.  OCD 2.  Tension headache 3.  Pineal cyst  Will check MRI of the brain without contrast, due to his concern for history of pineal cyst.  MRI was last done in 2017, was stable, had CT head in February 2020 for dizziness and tinnitus, appears overall stable. I will reorder his nortriptyline, working up to 20 mg at bedtime, to help with his sleeping, scalp sensitivity, and hopefully mood.  We have tried multiple medications for his OCD picking habits, he reports side effects, or does not take them (Effexor, Lexapro, mirtazapine, gabapentin).  I talked to him about considering referral to psychiatry in the future. Based on MRI we will get good guidance about how frequent, if at all his pineal cyst needs imaging surveillance, is quite anxiety provoking for him.  He will follow-up in 6 months or sooner if needed.  I spent 30 minutes of face-to-face and non-face-to-face time with patient.  This included previsit chart  review, lab review, study review, order entry, electronic health record documentation, patient education.  Butler Denmark, AGNP-C, DNP 09/10/2020, 4:23 PM [facility] [address] [place], [place] [address] [phone]

## 2020-09-11 ENCOUNTER — Telehealth: Payer: Self-pay | Admitting: Neurology

## 2020-09-11 NOTE — Progress Notes (Signed)
I have read the note, and I agree with the clinical assessment and plan.  Amaryah Mallen K Kaysa Roulhac   

## 2020-09-11 NOTE — Telephone Encounter (Signed)
[employer] Berkley Harvey: [id number] (exp. 09/11/20 to 03/09/21) order sent to GI. They will reach out to the patient to schedule.

## 2020-09-13 DIAGNOSIS — M79641 Pain in right hand: Secondary | ICD-10-CM | POA: Diagnosis not present

## 2020-09-26 ENCOUNTER — Ambulatory Visit
Admission: RE | Admit: 2020-09-26 | Discharge: 2020-09-26 | Disposition: A | Discharge status: Home / Self Care | Payer: BC Managed Care – PPO | Source: Ambulatory Visit | Attending: Neurology | Admitting: Neurology

## 2020-09-26 DIAGNOSIS — Z8639 Personal history of other endocrine, nutritional and metabolic disease: Secondary | ICD-10-CM

## 2020-09-30 ENCOUNTER — Telehealth: Payer: Self-pay | Admitting: Neurology

## 2020-09-30 NOTE — Telephone Encounter (Signed)
I am not sure this needs follow-up anytime within the next 5 years.  Unfortunately, this gentleman is somewhat anxious about his personal medical issues.

## 2020-09-30 NOTE — Telephone Encounter (Signed)
Recent MRI shows stable pineal cyst measuring 1.3 x 1.1 cm. Previously was 15 x 10 x 8 mm, converted to cm 1.5 x 1 x 0.8 cm in Dec 2017.  This is very anxiety provoking for him, can you make a recommendation on the recommended intervals for MRI if at all going forward?   IMPRESSION: 09/26/2020  MRI brain (without) demonstrating: - Stable pineal cyst measuring 1.3 x 1.1cm on axial views.  - No acute findings.   Dec 2017 IMPRESSION:  This MRI of the brain without contrast shows the following: 1.   15108 mm benign-appearing pineal cyst, unchanged when compared to the 2012 MRI. 2.    Few scattered T2/FLAIR hyperintense foci in the subcortical or deep white matter of the hemispheres consistent with a minimal chronic microvascular ischemic change, unchanged when compared to the previous MRI. 3.   There are no acute findings.

## 2020-10-01 ENCOUNTER — Other Ambulatory Visit: Payer: BC Managed Care – PPO

## 2020-10-03 ENCOUNTER — Telehealth: Payer: Self-pay

## 2020-10-03 NOTE — Telephone Encounter (Signed)
-----   Message from Glean Salvo, NP sent at 10/01/2020 11:03 AM EST ----- See telephone note, cyst is stable. Can follow every 5 years.

## 2020-10-03 NOTE — Telephone Encounter (Signed)
Pt verified by name and DOB,  normal results given per provider, pt voiced understanding all question answered.   Pt appreciated call  

## 2020-10-10 ENCOUNTER — Telehealth: Payer: Self-pay | Admitting: Neurology

## 2020-10-10 NOTE — Telephone Encounter (Signed)
Mailed to home address listed.  (MRI ).  LMVM for pt that did do this.

## 2020-10-10 NOTE — Telephone Encounter (Signed)
Pt called stating that he has been requesting his MRI report to be mailed to him and no one is getting back to him. Please mail copy of report to the address in his chart that I have confirmed.

## 2020-12-17 DIAGNOSIS — G5601 Carpal tunnel syndrome, right upper limb: Secondary | ICD-10-CM | POA: Diagnosis not present

## 2020-12-17 DIAGNOSIS — M65341 Trigger finger, right ring finger: Secondary | ICD-10-CM | POA: Diagnosis not present

## 2020-12-17 DIAGNOSIS — D66 Hereditary factor VIII deficiency: Secondary | ICD-10-CM | POA: Diagnosis not present

## 2020-12-17 DIAGNOSIS — M19021 Primary osteoarthritis, right elbow: Secondary | ICD-10-CM | POA: Diagnosis not present

## 2020-12-25 DIAGNOSIS — R0981 Nasal congestion: Secondary | ICD-10-CM | POA: Diagnosis not present

## 2020-12-25 DIAGNOSIS — Z22322 Carrier or suspected carrier of Methicillin resistant Staphylococcus aureus: Secondary | ICD-10-CM | POA: Diagnosis not present

## 2020-12-25 DIAGNOSIS — Z03818 Encounter for observation for suspected exposure to other biological agents ruled out: Secondary | ICD-10-CM | POA: Diagnosis not present

## 2020-12-25 DIAGNOSIS — L089 Local infection of the skin and subcutaneous tissue, unspecified: Secondary | ICD-10-CM | POA: Diagnosis not present

## 2020-12-25 DIAGNOSIS — R519 Headache, unspecified: Secondary | ICD-10-CM | POA: Diagnosis not present

## 2021-01-08 DIAGNOSIS — H40053 Ocular hypertension, bilateral: Secondary | ICD-10-CM | POA: Diagnosis not present

## 2021-01-21 DIAGNOSIS — F411 Generalized anxiety disorder: Secondary | ICD-10-CM | POA: Diagnosis not present

## 2021-01-21 DIAGNOSIS — K219 Gastro-esophageal reflux disease without esophagitis: Secondary | ICD-10-CM | POA: Diagnosis not present

## 2021-01-21 DIAGNOSIS — Z8619 Personal history of other infectious and parasitic diseases: Secondary | ICD-10-CM | POA: Diagnosis not present

## 2021-01-21 DIAGNOSIS — I10 Essential (primary) hypertension: Secondary | ICD-10-CM | POA: Diagnosis not present

## 2021-02-18 DIAGNOSIS — D66 Hereditary factor VIII deficiency: Secondary | ICD-10-CM | POA: Diagnosis not present

## 2021-02-18 DIAGNOSIS — Z20822 Contact with and (suspected) exposure to covid-19: Secondary | ICD-10-CM | POA: Diagnosis not present

## 2021-02-18 DIAGNOSIS — M19021 Primary osteoarthritis, right elbow: Secondary | ICD-10-CM | POA: Diagnosis not present

## 2021-02-18 DIAGNOSIS — G5601 Carpal tunnel syndrome, right upper limb: Secondary | ICD-10-CM | POA: Diagnosis not present

## 2021-02-19 DIAGNOSIS — Z20822 Contact with and (suspected) exposure to covid-19: Secondary | ICD-10-CM | POA: Diagnosis not present

## 2021-02-20 DIAGNOSIS — D66 Hereditary factor VIII deficiency: Secondary | ICD-10-CM | POA: Diagnosis not present

## 2021-02-20 DIAGNOSIS — K900 Celiac disease: Secondary | ICD-10-CM | POA: Diagnosis not present

## 2021-02-20 DIAGNOSIS — I10 Essential (primary) hypertension: Secondary | ICD-10-CM | POA: Diagnosis not present

## 2021-02-20 DIAGNOSIS — Z8619 Personal history of other infectious and parasitic diseases: Secondary | ICD-10-CM | POA: Diagnosis not present

## 2021-02-20 DIAGNOSIS — E291 Testicular hypofunction: Secondary | ICD-10-CM | POA: Diagnosis not present

## 2021-02-20 DIAGNOSIS — B1710 Acute hepatitis C without hepatic coma: Secondary | ICD-10-CM | POA: Diagnosis not present

## 2021-02-20 DIAGNOSIS — E559 Vitamin D deficiency, unspecified: Secondary | ICD-10-CM | POA: Diagnosis not present

## 2021-03-12 ENCOUNTER — Telehealth: Payer: Self-pay | Admitting: Neurology

## 2021-03-12 NOTE — Telephone Encounter (Signed)
Pt called he is sched in September with Dr. Anne Hahn, however he does not want that time he is scheduled for. Offered him to see the NP but he did not want that either. He states he would like to see someone else. Pt is requesting a call back.

## 2021-03-12 NOTE — Telephone Encounter (Signed)
Contacted pt back, due to sarah being out and dr Anne Hahn retirement soon, he did not want to wait until Maralyn Sago came back and the appointment he has for 05/02/21 at 915 will not work for him, he said that appointment was made without his consent (he missed the call to reschedule) when his original appointment got cancelled.   What other provider do you recommend this pt for ?

## 2021-03-12 NOTE — Telephone Encounter (Signed)
Pt returned call. Please call back.

## 2021-03-12 NOTE — Telephone Encounter (Signed)
Contacted pt back, LVM to return my call 

## 2021-03-13 NOTE — Telephone Encounter (Signed)
Called the patient back. He is wanting to establish and transition the care to Dr. Marjory Lies. Pt is not necessarily in a hurry to be seen just wanted to get set up with new provider with Dr Anne Hahn retiring. I have spoken with the team and was able to provide the patient with an apt on sept 13,2022 at 3:30 pm with check in of 3 p.

## 2021-03-18 ENCOUNTER — Ambulatory Visit: Payer: BC Managed Care – PPO | Admitting: Neurology

## 2021-03-25 ENCOUNTER — Ambulatory Visit: Payer: BC Managed Care – PPO | Admitting: Neurology

## 2021-03-26 DIAGNOSIS — M79641 Pain in right hand: Secondary | ICD-10-CM | POA: Diagnosis not present

## 2021-03-26 DIAGNOSIS — M79642 Pain in left hand: Secondary | ICD-10-CM | POA: Diagnosis not present

## 2021-03-26 DIAGNOSIS — R202 Paresthesia of skin: Secondary | ICD-10-CM | POA: Diagnosis not present

## 2021-04-08 ENCOUNTER — Ambulatory Visit: Payer: BC Managed Care – PPO | Admitting: Neurology

## 2021-04-15 ENCOUNTER — Ambulatory Visit: Payer: BC Managed Care – PPO | Admitting: Diagnostic Neuroimaging

## 2021-04-25 DIAGNOSIS — M25572 Pain in left ankle and joints of left foot: Secondary | ICD-10-CM | POA: Diagnosis not present

## 2021-04-25 DIAGNOSIS — M25562 Pain in left knee: Secondary | ICD-10-CM | POA: Diagnosis not present

## 2021-04-29 DIAGNOSIS — M545 Low back pain, unspecified: Secondary | ICD-10-CM | POA: Diagnosis not present

## 2021-04-29 DIAGNOSIS — M546 Pain in thoracic spine: Secondary | ICD-10-CM | POA: Diagnosis not present

## 2021-05-02 ENCOUNTER — Telehealth: Payer: Self-pay | Admitting: Neurology

## 2021-05-02 ENCOUNTER — Encounter: Payer: Self-pay | Admitting: Neurology

## 2021-05-02 ENCOUNTER — Ambulatory Visit: Payer: BC Managed Care – PPO | Admitting: Neurology

## 2021-05-02 NOTE — Telephone Encounter (Signed)
This patient did not show for a revisit appointment today. 

## 2021-05-07 NOTE — Telephone Encounter (Signed)
Patient called in stating that he received a letter stating he missed an appt on 05/02/21 and this appointment was supposed to have been previously cancelled. He was not aware that this was still on the schedule. I reviewed previous telephone note that patient did not previously consent to this appt being made and that it was to be cancelled but looks like no one had ever done that. Patient was very upset and did not want to have on his record that he no showed an appointment. I let him know I would put in a note to review this for him and that I was sorry for the mistake.

## 2021-05-14 DIAGNOSIS — F32A Depression, unspecified: Secondary | ICD-10-CM | POA: Diagnosis not present

## 2021-05-14 DIAGNOSIS — D66 Hereditary factor VIII deficiency: Secondary | ICD-10-CM | POA: Diagnosis not present

## 2021-05-14 DIAGNOSIS — F428 Other obsessive-compulsive disorder: Secondary | ICD-10-CM | POA: Diagnosis not present

## 2021-05-14 DIAGNOSIS — F411 Generalized anxiety disorder: Secondary | ICD-10-CM | POA: Diagnosis not present

## 2021-05-23 DIAGNOSIS — M546 Pain in thoracic spine: Secondary | ICD-10-CM | POA: Diagnosis not present

## 2021-05-23 DIAGNOSIS — M545 Low back pain, unspecified: Secondary | ICD-10-CM | POA: Diagnosis not present

## 2021-05-26 DIAGNOSIS — E291 Testicular hypofunction: Secondary | ICD-10-CM | POA: Diagnosis not present

## 2021-05-26 DIAGNOSIS — R7989 Other specified abnormal findings of blood chemistry: Secondary | ICD-10-CM | POA: Diagnosis not present

## 2021-05-26 DIAGNOSIS — N411 Chronic prostatitis: Secondary | ICD-10-CM | POA: Diagnosis not present

## 2021-05-26 DIAGNOSIS — Z87448 Personal history of other diseases of urinary system: Secondary | ICD-10-CM | POA: Diagnosis not present

## 2021-06-03 DIAGNOSIS — H40053 Ocular hypertension, bilateral: Secondary | ICD-10-CM | POA: Diagnosis not present

## 2021-06-03 DIAGNOSIS — H5212 Myopia, left eye: Secondary | ICD-10-CM | POA: Diagnosis not present

## 2021-06-17 DIAGNOSIS — Z862 Personal history of diseases of the blood and blood-forming organs and certain disorders involving the immune mechanism: Secondary | ICD-10-CM | POA: Diagnosis not present

## 2021-06-17 DIAGNOSIS — E291 Testicular hypofunction: Secondary | ICD-10-CM | POA: Diagnosis not present

## 2021-06-17 DIAGNOSIS — E782 Mixed hyperlipidemia: Secondary | ICD-10-CM | POA: Diagnosis not present

## 2021-06-17 DIAGNOSIS — R7309 Other abnormal glucose: Secondary | ICD-10-CM | POA: Diagnosis not present

## 2021-06-17 DIAGNOSIS — E559 Vitamin D deficiency, unspecified: Secondary | ICD-10-CM | POA: Diagnosis not present

## 2021-06-18 DIAGNOSIS — Z113 Encounter for screening for infections with a predominantly sexual mode of transmission: Secondary | ICD-10-CM | POA: Diagnosis not present

## 2021-06-18 DIAGNOSIS — L309 Dermatitis, unspecified: Secondary | ICD-10-CM | POA: Diagnosis not present

## 2021-06-18 DIAGNOSIS — Z125 Encounter for screening for malignant neoplasm of prostate: Secondary | ICD-10-CM | POA: Diagnosis not present

## 2021-06-18 DIAGNOSIS — Z0000 Encounter for general adult medical examination without abnormal findings: Secondary | ICD-10-CM | POA: Diagnosis not present

## 2021-06-23 DIAGNOSIS — N411 Chronic prostatitis: Secondary | ICD-10-CM | POA: Diagnosis not present

## 2021-06-23 DIAGNOSIS — Z87448 Personal history of other diseases of urinary system: Secondary | ICD-10-CM | POA: Diagnosis not present

## 2021-06-23 DIAGNOSIS — R339 Retention of urine, unspecified: Secondary | ICD-10-CM | POA: Diagnosis not present

## 2021-06-23 DIAGNOSIS — E291 Testicular hypofunction: Secondary | ICD-10-CM | POA: Diagnosis not present

## 2021-07-01 ENCOUNTER — Ambulatory Visit: Payer: BC Managed Care – PPO | Admitting: Diagnostic Neuroimaging

## 2021-07-15 DIAGNOSIS — M65322 Trigger finger, left index finger: Secondary | ICD-10-CM | POA: Diagnosis not present

## 2021-07-15 DIAGNOSIS — D66 Hereditary factor VIII deficiency: Secondary | ICD-10-CM | POA: Diagnosis not present

## 2021-07-15 DIAGNOSIS — M65331 Trigger finger, right middle finger: Secondary | ICD-10-CM | POA: Diagnosis not present

## 2021-07-29 DIAGNOSIS — M19021 Primary osteoarthritis, right elbow: Secondary | ICD-10-CM | POA: Diagnosis not present

## 2021-08-06 DIAGNOSIS — M79641 Pain in right hand: Secondary | ICD-10-CM | POA: Diagnosis not present

## 2021-08-06 DIAGNOSIS — M79642 Pain in left hand: Secondary | ICD-10-CM | POA: Diagnosis not present

## 2021-08-13 DIAGNOSIS — M79642 Pain in left hand: Secondary | ICD-10-CM | POA: Diagnosis not present

## 2021-08-13 DIAGNOSIS — M79641 Pain in right hand: Secondary | ICD-10-CM | POA: Diagnosis not present

## 2021-08-20 DIAGNOSIS — M79642 Pain in left hand: Secondary | ICD-10-CM | POA: Diagnosis not present

## 2021-08-20 DIAGNOSIS — M79641 Pain in right hand: Secondary | ICD-10-CM | POA: Diagnosis not present

## 2021-08-26 DIAGNOSIS — M79641 Pain in right hand: Secondary | ICD-10-CM | POA: Diagnosis not present

## 2021-08-26 DIAGNOSIS — M79642 Pain in left hand: Secondary | ICD-10-CM | POA: Diagnosis not present

## 2021-08-26 DIAGNOSIS — M25531 Pain in right wrist: Secondary | ICD-10-CM | POA: Diagnosis not present

## 2021-09-01 ENCOUNTER — Ambulatory Visit: Payer: BC Managed Care – PPO | Admitting: Diagnostic Neuroimaging

## 2021-09-02 DIAGNOSIS — A630 Anogenital (venereal) warts: Secondary | ICD-10-CM | POA: Diagnosis not present

## 2021-09-02 DIAGNOSIS — L0889 Other specified local infections of the skin and subcutaneous tissue: Secondary | ICD-10-CM | POA: Diagnosis not present

## 2021-09-02 DIAGNOSIS — B360 Pityriasis versicolor: Secondary | ICD-10-CM | POA: Diagnosis not present

## 2021-09-02 DIAGNOSIS — L738 Other specified follicular disorders: Secondary | ICD-10-CM | POA: Diagnosis not present

## 2021-09-02 DIAGNOSIS — L281 Prurigo nodularis: Secondary | ICD-10-CM | POA: Diagnosis not present

## 2021-09-03 DIAGNOSIS — M79642 Pain in left hand: Secondary | ICD-10-CM | POA: Diagnosis not present

## 2021-09-03 DIAGNOSIS — M79641 Pain in right hand: Secondary | ICD-10-CM | POA: Diagnosis not present

## 2021-09-10 DIAGNOSIS — M79642 Pain in left hand: Secondary | ICD-10-CM | POA: Diagnosis not present

## 2021-09-10 DIAGNOSIS — M79641 Pain in right hand: Secondary | ICD-10-CM | POA: Diagnosis not present

## 2021-09-17 DIAGNOSIS — M79642 Pain in left hand: Secondary | ICD-10-CM | POA: Diagnosis not present

## 2021-09-17 DIAGNOSIS — M79641 Pain in right hand: Secondary | ICD-10-CM | POA: Diagnosis not present

## 2021-09-24 DIAGNOSIS — E291 Testicular hypofunction: Secondary | ICD-10-CM | POA: Diagnosis not present

## 2021-09-24 DIAGNOSIS — N411 Chronic prostatitis: Secondary | ICD-10-CM | POA: Diagnosis not present

## 2021-09-24 DIAGNOSIS — Z87448 Personal history of other diseases of urinary system: Secondary | ICD-10-CM | POA: Diagnosis not present

## 2021-09-24 DIAGNOSIS — Z125 Encounter for screening for malignant neoplasm of prostate: Secondary | ICD-10-CM | POA: Diagnosis not present

## 2021-10-01 ENCOUNTER — Telehealth: Payer: Self-pay | Admitting: Neurology

## 2021-10-01 DIAGNOSIS — M79642 Pain in left hand: Secondary | ICD-10-CM | POA: Diagnosis not present

## 2021-10-01 DIAGNOSIS — M79641 Pain in right hand: Secondary | ICD-10-CM | POA: Diagnosis not present

## 2021-10-01 NOTE — Telephone Encounter (Signed)
Pt called to confirm upcoming appointment .  Pt acknowledged the reason why he saw Dr Anne Hahn but now feels he may have vertigo.  It was explained that every diagnosis requires its own referral.  Pt is fully aware now but does not agree that it makes sense that he would require another referral.  The purpose of phone note is for Dr Marjory Lies and RN to be aware pt has been informed of a referral being needed for every diagnosis.  ?

## 2021-10-01 NOTE — Telephone Encounter (Signed)
Noted  

## 2021-10-03 DIAGNOSIS — L309 Dermatitis, unspecified: Secondary | ICD-10-CM | POA: Diagnosis not present

## 2021-10-03 DIAGNOSIS — L0889 Other specified local infections of the skin and subcutaneous tissue: Secondary | ICD-10-CM | POA: Diagnosis not present

## 2021-10-03 DIAGNOSIS — L011 Impetiginization of other dermatoses: Secondary | ICD-10-CM | POA: Diagnosis not present

## 2021-10-03 DIAGNOSIS — L280 Lichen simplex chronicus: Secondary | ICD-10-CM | POA: Diagnosis not present

## 2021-10-07 ENCOUNTER — Encounter: Payer: Self-pay | Admitting: Diagnostic Neuroimaging

## 2021-10-07 ENCOUNTER — Ambulatory Visit: Payer: BC Managed Care – PPO | Admitting: Diagnostic Neuroimaging

## 2021-10-07 ENCOUNTER — Other Ambulatory Visit: Payer: Self-pay

## 2021-10-07 VITALS — BP 133/78 | HR 113 | Ht 70.0 in | Wt 186.0 lb

## 2021-10-07 DIAGNOSIS — R42 Dizziness and giddiness: Secondary | ICD-10-CM | POA: Diagnosis not present

## 2021-10-07 NOTE — Progress Notes (Signed)
[facility]  PATIENT: Darrell Graham. DOB: [birth year]  REFERRING CLINICIAN: London Pepper, MD HISTORY FROM: patient REASON FOR VISIT: new consult    HISTORICAL  CHIEF COMPLAINT:  Chief Complaint  Patient presents with   Headache    Rm 7 New Pt, TOC Dr Jannifer Franklin  "I have bad vertigo, never had it before"    HISTORY OF PRESENT ILLNESS:   UPDATE (10/07/21, VRP): Since last visit, doing about the same, except some dizzy, nausea, floating, fog, fatigue sensation since Sep 14, 2021. Also with increasd nasal drainage. Also with increased rash on back on neck. Also with some ongoing headaches, left forehead sensitivity, puffiness (since past 15 years).   UPDATE (09/10/20, SS): Darrell Graham is a 56 year old male with history of OCD disorder and anxiety.  He has been prescribed multiple medications from our office, but usually does not take them, due to fear of side effect or reports intolerance, including Lexapro, Effexor, gabapentin, mirtazapine and nortriptyline.  He has constant issues scratching his head, leaves sores behind.  Is currently being treated with an oral antibiotic for staph infection.  He sees dermatology.  He does not sleep well at night.  Claims he has pain to the left side of his head when lying on the left side.  Under more stress, caring for his mother who has dementia.  He works part-time doing yard work.  He is worried about his pineal cyst that has been stable, and appears benign.  He presents today for evaluation unaccompanied.  PRIOR HPI (03/06/2020 Dr. Jannifer Franklin): Darrell Graham is a 56 year old right handed white male with a history of an OCD disorder and anxiety problems.  The patient indicates that he was having some panic attacks on occasion.  The patient is having constant problems with head pressure throughout the head, he still is scratching areas on his head leaving sores.  The head scratching is mainly on the left side, he cannot lay on the left in  part because of this.  He has been tried in the past on Lexapro but did not tolerate it, he did not tolerate the mirtazapine, he never took Effexor, he was given a brief trial on low-dose gabapentin.  The patient is not sleeping well at night he usually.  He returns to the office today for an evaluation.  REVIEW OF SYSTEMS: Full 14 system review of systems performed and negative with exception of: as per hpi.  ALLERGIES: Allergies  Allergen Reactions   Aspirin Other (See Comments)    Bleeding    Doxycycline     Other reaction(s): rash   Hemophilia Support [Acid Blockers Support] Other (See Comments)    Hemophilia Factor VIII   Codeine Itching and Rash   Gluten Meal Diarrhea, Itching and Rash    General lethargy   Tetracycline Hcl Itching and Rash   Tetracyclines & Related Itching and Rash   Wheat Bran Diarrhea, Itching and Rash    General lethargy    HOME MEDICATIONS: Outpatient Medications Prior to Visit  Medication Sig Dispense Refill   ALPRAZolam (XANAX) 0.25 MG tablet Take 0.25 mg by mouth at bedtime as needed for anxiety.     amLODipine (NORVASC) 5 MG tablet Take 5 mg by mouth daily.     Antihemophilic Factor rAHF-PAF (XYNTHA SOLOFUSE) 3000 UNITS KIT Inject 3,000 Units into the vein as needed. (Patient taking differently: Inject 3,000 Units into the vein as needed (for bleeding).) 1 kit 1   celecoxib (CELEBREX) 100 MG capsule  celecoxib 100 mg capsule  TAKE 1 CAPSULE BY MOUTH TWICE DAILY     Emicizumab-kxwh (HEMLIBRA [place]) Inject into the skin.     HYDROcodone-acetaminophen (NORCO/VICODIN) 5-325 MG tablet Take 1-2 tablets by mouth every 6 (six) hours as needed for moderate pain or severe pain. 20 tablet 0   lidocaine (LIDODERM) 5 % lidocaine 5 % topical patch  APPLY 1 2 PATCH TO INTACT SKIN ONCE A DAY. REMOVE AFTER 12 HOURS AS NEEDED     losartan (COZAAR) 25 MG tablet SMARTSIG:1 By Mouth     Naphazoline-Pheniramine 0.027-0.315 % SOLN Apply 1 drop to eye daily as needed (for dry  eyes).     pantoprazole (PROTONIX) 40 MG tablet Take 40 mg daily by mouth.     doxycycline (VIBRAMYCIN) 100 MG capsule Take 100 mg by mouth 2 (two) times daily. (Patient not taking: Reported on 10/07/2021)     nortriptyline (PAMELOR) 10 MG capsule Take 1 tablet at bedtime for 1 week, then take 2 tablets at bedtime (Patient not taking: Reported on 10/07/2021) 60 capsule 5   No facility-administered medications prior to visit.    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[place])    Hepatitis C virus    recent viral titer negative   Hernia    High blood pressure    History of pineal cyst 08/26/2018   OCD (obsessive compulsive disorder) 07/01/2015   Scoliosis    Telangiectasias    upper back and chest   UTI (urinary tract infection) 09/2015    PAST SURGICAL HISTORY: Past Surgical History:  Procedure Laterality Date   ANKLE ARTHROSCOPY  1996   BLADDER SURGERY  2017   SCROTAL SURGERY     TRIGGER FINGER RELEASE Left    March 2018, bilat in 07/2021   URETHRA SURGERY     June 2018- dilation of urethra     FAMILY HISTORY: Family History  Problem Relation Age of Onset   Dementia Mother    Migraines Father    Stroke Father    Heart disease Father    Hemophilia Other    Hepatitis C Other    Osteopenia Other    Psoriasis Other    GER disease Other     SOCIAL HISTORY: Social History   Socioeconomic History   Marital status: Single    Spouse name: Not on file   Number of children: 0   Years of education: 12   Highest education level: Not on file  Occupational History    Comment: Not working at this time.  Tobacco Use   Smoking status: Never   Smokeless tobacco: Never  Substance and Sexual Activity   Alcohol use: No   Drug use: No   Sexual activity: Not on file   Other Topics Concern   Not on file  Social History Narrative   Patient is single and lives at home with his mother. Patient has a high school education.   Caffeine- 1-2 daily.   Right handed.   Social Determinants of Health   Financial Resource Strain: Not on file  Food Insecurity: Not on file  Transportation Needs: Not on file  Physical Activity: Not on file  Stress: Not on  file  Social Connections: Not on file  Intimate Partner Violence: Not on file     PHYSICAL EXAM  GENERAL EXAM/CONSTITUTIONAL: Vitals:  Vitals:   10/07/21 1553  BP: 133/78  Pulse: (!) 113  Weight: 186 lb (84.4 kg)  Height: '5\' 10"'  (1.778 m)   Body mass index is 26.69 kg/m. Wt Readings from Last 3 Encounters:  10/07/21 186 lb (84.4 kg)  09/10/20 180 lb (81.6 kg)  02/28/19 179 lb 12.8 oz (81.6 kg)   Patient is in no distress; well developed, nourished and groomed; neck is supple  CARDIOVASCULAR: Examination of carotid arteries is normal; no carotid bruits Regular rate and rhythm, no murmurs Examination of peripheral vascular system by observation and palpation is normal  EYES: Ophthalmoscopic exam of optic discs and posterior segments is normal; no papilledema or hemorrhages No results found.  MUSCULOSKELETAL: Gait, strength, tone, movements noted in Neurologic exam below  NEUROLOGIC: MENTAL STATUS:  No flowsheet data found. awake, alert, oriented to person, place and time recent and remote memory intact normal attention and concentration language fluent, comprehension intact, naming intact fund of knowledge appropriate  CRANIAL NERVE:  2nd - no papilledema on fundoscopic exam 2nd, 3rd, 4th, 6th - pupils equal and reactive to light, visual fields full to confrontation, extraocular muscles intact, no nystagmus 5th - facial sensation symmetric 7th - facial strength symmetric 8th - hearing intact 9th - palate elevates symmetrically, uvula midline 11th - shoulder shrug symmetric 12th  - tongue protrusion midline  MOTOR:  normal bulk and tone, full strength in the BUE, BLE  SENSORY:  normal and symmetric to light touch, temperature, vibration  COORDINATION:  finger-nose-finger, fine finger movements normal  REFLEXES:  deep tendon reflexes present and symmetric  GAIT/STATION:  narrow based gait     DIAGNOSTIC DATA (LABS, IMAGING, TESTING) - I reviewed patient records, labs, notes, testing and imaging myself where available.  Lab Results  Component Value Date   WBC 4.1 09/26/2015   HGB 10.4 (L) 09/26/2015   HCT 31.6 (L) 09/26/2015   MCV 91.1 09/26/2015   PLT 326 09/26/2015      Component Value Date/Time   NA 141 09/23/2015 0152   NA 140 06/19/2013 1316   K 3.6 09/23/2015 0152   K 3.9 06/19/2013 1316   CL 108 09/23/2015 0152   CO2 25 09/23/2015 0152   CO2 24 06/19/2013 1316   GLUCOSE 121 (H) 09/23/2015 0152   GLUCOSE 117 06/19/2013 1316   BUN 7 09/23/2015 0152   BUN 17.2 06/19/2013 1316   CREATININE 0.79 09/23/2015 0152   CREATININE 0.9 06/19/2013 1316   CALCIUM 8.6 (L) 09/23/2015 0152   CALCIUM 10.2 06/19/2013 1316   PROT 6.7 09/23/2015 0152   PROT 8.1 06/19/2013 1316   ALBUMIN 3.1 (L) 09/23/2015 0152   ALBUMIN 4.7 06/19/2013 1316   AST 16 09/23/2015 0152   AST 17 06/19/2013 1316   ALT 11 (L) 09/23/2015 0152   ALT 10 06/19/2013 1316   ALKPHOS 53 09/23/2015 0152   ALKPHOS 72 06/19/2013 1316   BILITOT 0.6 09/23/2015 0152   BILITOT 0.63 06/19/2013 1316   GFRNONAA >60 09/23/2015 0152   GFRAA >60 09/23/2015 0152   No results found for: CHOL, HDL, LDLCALC, LDLDIRECT, TRIG, CHOLHDL No results found for: HGBA1C No results found for: VITAMINB12 No results found for: TSH   09/26/20 MRI brain (without) demonstrating: - Stable pineal cyst measuring 1.3 x 1.1cm on axial views.  - No acute findings.     ASSESSMENT  AND PLAN  56 y.o. year old male here with:   Dx:  1. Dizziness       PLAN:  DIZZINESS, NAUSEA, BRAIN FOG, FATIGUE  (new onset since 09/14/21) - non-specific symptoms; refer to vestibular PT (patient request) - check MRI brain (rule out ICH, stroke or other new issues)  Orders Placed This Encounter  Procedures   MR BRAIN WO CONTRAST   Ambulatory referral to Physical Therapy   Return for pending if symptoms worsen or fail to improve, pending test results.    Penni Bombard, MD 0/09/6689, 6:75 PM Certified in Neurology, Neurophysiology and Neuroimaging  [facility] [address], [place], [place] [address] [phone]

## 2021-10-07 NOTE — Patient Instructions (Signed)
-   check MRI brain ?- refer to vestibular PT ?

## 2021-10-08 ENCOUNTER — Telehealth: Payer: Self-pay | Admitting: Diagnostic Neuroimaging

## 2021-10-08 DIAGNOSIS — R42 Dizziness and giddiness: Secondary | ICD-10-CM

## 2021-10-08 DIAGNOSIS — M79642 Pain in left hand: Secondary | ICD-10-CM | POA: Diagnosis not present

## 2021-10-08 DIAGNOSIS — Z8639 Personal history of other endocrine, nutritional and metabolic disease: Secondary | ICD-10-CM

## 2021-10-08 DIAGNOSIS — M79641 Pain in right hand: Secondary | ICD-10-CM | POA: Diagnosis not present

## 2021-10-08 NOTE — Telephone Encounter (Signed)
Orders Placed This Encounter  ?Procedures  ? MR BRAIN/IAC W WO CONTRAST  ? ? ?Suanne Marker, MD 10/08/2021, 6:16 PM ?Certified in Neurology, Neurophysiology and Neuroimaging ? ?[facility] ?[address], [address] ?[place], [place] [address] ?([phone] ? ?

## 2021-10-08 NOTE — Telephone Encounter (Signed)
Pt calling in regards to MRI; will be willing to do MRI with contrast. ?Pt asking if can get blood work done. ?Would like a call from the nurse. ?

## 2021-10-09 ENCOUNTER — Telehealth: Payer: Self-pay | Admitting: Diagnostic Neuroimaging

## 2021-10-09 NOTE — Telephone Encounter (Signed)
Spoke with patient and advised Dr Leta Baptist recommends he follow up with PCP and dermatology  for MRSA and CBC. Darrell KitchenPatient verbalized understanding, appreciation. ? ?

## 2021-10-09 NOTE — Telephone Encounter (Signed)
Called patient and informed him Dr Marjory Lies ordered MRI with and without contrast. He'll get a call to schedule after insurance approves. He then asked if Dr Marjory Lies would order a CBC, stated he has a lot going on, isn't himself and it hasn't been checked in a few weeks. I recommended he contact PCP for lab work. On further conversation he said he has MRSA and a scalp infection and thinks it needs to be followed up. I asked who is treating the infection; it is dermatology.  I advised he contact them; he stated they never follow up with him or check on him after seeing him once. I advised I will let Dr Marjory Lies know of his request but feel he will recommend follow up with dermatology or PCP. I'll call him back. Patient verbalized understanding, appreciation. ? ?

## 2021-10-09 NOTE — Telephone Encounter (Signed)
[employer]BS Berkley Harvey: [phone] (exp. 10/09/21 to 11/07/21) order sent to GI, they will reach out to the patient to schedule.  ?

## 2021-10-13 ENCOUNTER — Ambulatory Visit
Admission: RE | Admit: 2021-10-13 | Discharge: 2021-10-13 | Disposition: A | Discharge status: Home / Self Care | Payer: BC Managed Care – PPO | Source: Ambulatory Visit | Attending: Diagnostic Neuroimaging | Admitting: Diagnostic Neuroimaging

## 2021-10-13 ENCOUNTER — Other Ambulatory Visit: Payer: Self-pay

## 2021-10-13 DIAGNOSIS — R42 Dizziness and giddiness: Secondary | ICD-10-CM | POA: Diagnosis not present

## 2021-10-13 DIAGNOSIS — Z8639 Personal history of other endocrine, nutritional and metabolic disease: Secondary | ICD-10-CM | POA: Diagnosis not present

## 2021-10-13 MED ORDER — GADOBENATE DIMEGLUMINE 529 MG/ML IV SOLN
17.0000 mL | Freq: Once | INTRAVENOUS | Status: AC | PRN
  Administered 2021-10-13: 17 mL via INTRAVENOUS

## 2021-10-14 DIAGNOSIS — M65322 Trigger finger, left index finger: Secondary | ICD-10-CM | POA: Diagnosis not present

## 2021-10-14 DIAGNOSIS — M25521 Pain in right elbow: Secondary | ICD-10-CM | POA: Diagnosis not present

## 2021-10-14 DIAGNOSIS — M65331 Trigger finger, right middle finger: Secondary | ICD-10-CM | POA: Diagnosis not present

## 2021-10-14 DIAGNOSIS — M79641 Pain in right hand: Secondary | ICD-10-CM | POA: Diagnosis not present

## 2021-10-14 DIAGNOSIS — M79642 Pain in left hand: Secondary | ICD-10-CM | POA: Diagnosis not present

## 2021-10-15 DIAGNOSIS — K1379 Other lesions of oral mucosa: Secondary | ICD-10-CM | POA: Diagnosis not present

## 2021-10-16 ENCOUNTER — Other Ambulatory Visit: Payer: BC Managed Care – PPO

## 2021-10-16 ENCOUNTER — Telehealth: Payer: Self-pay

## 2021-10-16 NOTE — Telephone Encounter (Signed)
-----   Message from Suanne Marker, MD sent at 10/15/2021  5:02 PM EDT ----- ?Unremarkable imaging results. Please call patient. Continue current plan. -VRP ?

## 2021-10-16 NOTE — Telephone Encounter (Signed)
Contacted pt, informed him his MRI came back with unremarkable imaging results. Advised to continue current plan. Pt asked if cyst has grown any since last visit. Informed him per last report it is 11x10 mm And per report from 09/2020 it was 1.3x1.1cm. Explained to pt 11 mm is equal to 1.1 cm and 10 mm is equal to 1 cm. Cyst is pretty stable and has barely grown.  Pt rq report be mailed to him, Report has been placed in outgoing mail.  ?

## 2021-10-20 ENCOUNTER — Ambulatory Visit: Payer: BC Managed Care – PPO | Attending: Diagnostic Neuroimaging | Admitting: Physical Therapy

## 2021-10-20 ENCOUNTER — Encounter: Payer: Self-pay | Admitting: Physical Therapy

## 2021-10-20 ENCOUNTER — Other Ambulatory Visit: Payer: Self-pay

## 2021-10-20 DIAGNOSIS — M542 Cervicalgia: Secondary | ICD-10-CM | POA: Diagnosis not present

## 2021-10-20 DIAGNOSIS — R42 Dizziness and giddiness: Secondary | ICD-10-CM | POA: Insufficient documentation

## 2021-10-20 DIAGNOSIS — H8111 Benign paroxysmal vertigo, right ear: Secondary | ICD-10-CM | POA: Insufficient documentation

## 2021-10-20 NOTE — Therapy (Signed)
?OUTPATIENT PHYSICAL THERAPY VESTIBULAR EVALUATION ? ? ? ? ?Patient Name: Darrell Graham. ?MRN: [id number] ?DOB:[birth year], 56 y.o., male ?Today's Date: 10/20/2021 ? ?PCP: London Pepper, MD ?REFERRING PROVIDER: Penni Bombard, MD ? ? PT End of Session - 10/20/21 2018   ? ? Visit Number 1   ? Number of Visits 9   ? Date for PT Re-Evaluation 11/19/21   ? Authorization Type BCBS   ? PT Start Time [id number]   ? PT Stop Time 1540   ? PT Time Calculation (min) 46 min   ? Activity Tolerance Patient tolerated treatment well   ? Behavior During Therapy [facility] for tasks assessed/performed   ? ?  ?  ? ?  ? ? ?Past Medical History:  ?Diagnosis Date  ? Allergy   ? Asthma   ? Celiac disease   ? Chronic arthropathy   ? left ankle & right elbow  ? Deviated nasal septum   ? Diverticulitis   ? Glaucoma   ? H/O vitamin D deficiency   ? 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 Hepatitis C virus   ? recent viral titer negative  ? Hernia   ? High blood pressure   ? History of pineal cyst 08/26/2018  ? OCD (obsessive compulsive disorder) 07/01/2015  ? Scoliosis   ? Telangiectasias   ? upper back and chest  ? UTI (urinary tract infection) 09/2015  ? ?Past Surgical History:  ?Procedure Laterality Date  ? ANKLE ARTHROSCOPY  1996  ? BLADDER SURGERY  2017  ? SCROTAL SURGERY    ? TRIGGER FINGER RELEASE Left   ? March 2018, bilat in 07/2021  ? URETHRA SURGERY    ? June 2018- dilation of urethra   ? ?Patient Active Problem List  ? Diagnosis Date Noted  ? History of pineal cyst 08/26/2018  ? Diverticulitis of sigmoid colon 09/25/2015  ? Colo-vesical fistula 09/25/2015  ? Sepsis ([place]) 09/22/2015  ? UTI (lower urinary tract infection) 09/22/2015  ? OCD (obsessive compulsive disorder) 07/01/2015  ? Hemophilia ([place]) 03/23/2013  ? Headache 03/23/2013  ? ? ?ONSET DATE: 09/12/2021  ? ?REFERRING DIAG: R42 (ICD-10-CM) - Dizziness  ? ?THERAPY DIAG:  ?BPPV (benign paroxysmal positional vertigo), right ? ?Dizziness and giddiness ? ?Cervicalgia ? ?SUBJECTIVE:  ? ?SUBJECTIVE  STATEMENT: ?Started on February 10th, felt woozy.  Had other symptoms develop over the next month - dazed/foggy, head pressure, and pressure behind eyes, cramping in face, nausea, confusion, fatigue, dizziness when looking down, feels like hearing is decreased in L ear, decreased appetite, and increased tinnitus.  Denies vomiting or changes in vision.  HA is a constant pressure, especially on L side of face/head - not able to sleep in L side due to this pressure and sensitivity.  Was on doxycycline for 2 weeks for skin staph infection but dizziness started before he took antibiotics.  Had neurology appointment - ordered MRI.  MRI did not show any abnormal findings but did have slight increase in pineal cyst per patient report; pt wonders if the cyst could be causing these symptoms.  Woke up this morning with fluid draining out of R ear.  Pt feels woozy right now at baseline. ? ?Pt accompanied by: self ? ?PERTINENT HISTORY: OCD disorder and anxiety, staph infection on scalp and back of neck ? ?PAIN:  ?Are you having pain?  Slight neck pain and headache ? ?PRECAUTIONS: None; skin ? ?WEIGHT BEARING RESTRICTIONS No ? ?FALLS: Has patient fallen in last 6 months? No, Number of  falls: 0 but feels off balance. ? ?LIVING ENVIRONMENT: ?Lives with: lives with their family and takes care of elderly mother who has dementia.     ?Lives in: House/apartment ? ?PLOF: Independent; is still driving, but has to take more rest breaks and takes more naps.   ? ?PATIENT GOALS:  Feels like he needs respite care; needs relief from the head pressure.  To eliminate the dizziness if possible and educate self about it.   ? ?OBJECTIVE:  ? ?DIAGNOSTIC FINDINGS: IMPRESSION: Normal MRI brain / IAC (with and without).  ? ?COGNITION: ?Overall cognitive status:  pt reports he feels like his cognition has changed. ?  ?SENSATION: ?WFL ? ?Cervical ROM:   TBA  ? ?Active A/PROM (deg) ?10/20/2021  ?Flexion   ?Extension   ?Right lateral flexion   ?Left lateral  flexion   ?Right rotation   ?Left rotation   ?(Blank rows = not tested) ? ?STRENGTH: Feels like LE get weaker when walking longer distances.  MMT: 5/5 bilaterally. ? ?GAIT: ?Gait pattern: WFL ?Distance walked: 115 ?Assistive device utilized: None ?Level of assistance: Complete Independence ?Comments:  ? ?VESTIBULAR ASSESSMENT ? ? GENERAL OBSERVATION: No imbalance walking back from waiting area. ?  ? SYMPTOM BEHAVIOR: ?  Subjective history: see above ?  Non-Vestibular symptoms: changes in hearing, neck pain, headaches, tinnitus, and nausea/vomiting ?  Type of dizziness: Imbalance (Disequilibrium) and "Funny feeling in the head" ?  Frequency: daily ?  Duration: constant-varies day to day; intensity varies day to day. ?  Aggravating factors: Induced by motion: occur when walking, bending down to the ground, and activity in general and Worse with fatigue ?  Relieving factors: lying supine and rest, improves but never really goes away. ?  Progression of symptoms: worse ? ? OCULOMOTOR EXAM: ?  Ocular Alignment: normal ?  Ocular ROM: No Limitations ?  Spontaneous Nystagmus: absent ?  Gaze-Induced Nystagmus: absent ?  Smooth Pursuits: intact ?  Saccades: intact ?    ? VESTIBULAR - OCULAR REFLEX:  ?  Slow VOR: Normal ?  VOR Cancellation: Normal ?  Head-Impulse Test: HIT Right: negative ?HIT Left: negative ?  Dynamic Visual Acuity:  N/T ?  ? POSITIONAL TESTING: Right Dix-Hallpike: upbeating, right nystagmus; Duration:35 seconds ?Left Dix-Hallpike: none; Duration: 0 ?Right Roll Test: none; Duration: 0 ?Left Roll Test: none; Duration: 0 ?  ? ?MOTION SENSITIVITY: ? ?  Motion Sensitivity Quotient ? ?Intensity: 0 = none, 1 = Lightheaded, 2 = Mild, 3 = Moderate, 4 = Severe, 5 = Vomiting ? Intensity  ?1. Sitting to supine   ?2. Supine to L side   ?3. Supine to R side   ?4. Supine to sitting   ?5. L Hallpike-Dix   ?6. Up from L    ?7. R Hallpike-Dix   ?8. Up from R    ?9. Sitting, head  ?tipped to L knee   ?10. Head up from L  ?knee    ?11. Sitting, head  ?tipped to R knee   ?12. Head up from R  ?knee   ?13. Sitting head turns x5   ?14.Sitting head nods x5   ?15. In stance, 180?  ?turn to L    ?16. In stance, 180?  ?turn to R   ?  ?TBA ? ?VESTIBULAR TREATMENT: ? ?Canalith Repositioning: ?  Epley Right: Number of Reps: 2, Response to Treatment: comment: no change - nystagmus very low intensity and long duration which may indicate cupulolithiasis and may benefit from Semont maneuver  next session. ? ?PATIENT EDUCATION: ?Education details: Clinical findings, BPPV and how that may be contributing to many of this ongoing symptoms, treatment for BPPV, prolonged positioning by sleeping on L side for the next two nights, PT POC and goals ?Person educated: Patient ?Education method: Explanation ?Education comprehension: verbalized understanding ? ?GOALS: ?Goals reviewed with patient? Yes ? ?LONG TERM GOALS: Target date: 11/19/2021 ? ?Pt will demonstrate independence with final vestibular and cervical HEP ?Baseline:  ?Goal status: INITIAL ? ?2.  Pt will demonstrate full resolution of R BPPV  ?Baseline:  ?Goal status: INITIAL ? ?3.  Pt will report 0/5 dizziness on MSQ to allow for full return to daily activities and long walks outside. ?Baseline:  ?Goal status: INITIAL ? ?4.  Pt will demonstrate 10 degree improvement in pain free, neck ROM ?Baseline: TBA ?Goal status: INITIAL ? ? ? ?ASSESSMENT: ? ?CLINICAL IMPRESSION: ?Patient is a 56 y.o. male who was seen today for physical therapy evaluation and treatment for 2 month history of vertigo, neck pain, and fatigue.  ? ? ?OBJECTIVE IMPAIRMENTS decreased activity tolerance, decreased mobility, difficulty walking, decreased ROM, dizziness, and pain.  ? ?ACTIVITY LIMITATIONS cleaning, community activity, meal prep, laundry, yard work, shopping, and church.  ? ?PERSONAL FACTORS Behavior pattern, Social background, and 3+ comorbidities: see above  are also affecting patient's functional outcome.  ? ? ?REHAB  POTENTIAL: Good ? ?CLINICAL DECISION MAKING: Stable/uncomplicated ? ?EVALUATION COMPLEXITY: Low ? ? ?PLAN: ?PT FREQUENCY: 2x/week ? ?PT DURATION: 4 weeks ? ?PLANNED INTERVENTIONS: Therapeutic exercises, Therapeut

## 2021-10-20 NOTE — Telephone Encounter (Signed)
Pt rq call home first then cell if no answer. His mother has an appt tomorrow as well if they are not at home when called.  ?

## 2021-10-20 NOTE — Telephone Encounter (Signed)
Went to lobby to speak to patient, he wanted to request a call from Dr Marjory Lies regarding cyst and other questions about disability. Pt is interested in filing for disability and wanted to know if the cyst could be included in disability when filing. Please advise.  ?

## 2021-10-20 NOTE — Telephone Encounter (Signed)
Pt stopped in after PT and asked for a return call from [place] to discuss results and had a few other questions. Patient was thinking about filing for disability and wanted Dr. Notes and summary of his findings. Can you please call his cell phone and then home if unable to reach. ? ? ?

## 2021-10-21 ENCOUNTER — Ambulatory Visit (INDEPENDENT_AMBULATORY_CARE_PROVIDER_SITE_OTHER): Payer: BC Managed Care – PPO | Admitting: Diagnostic Neuroimaging

## 2021-10-21 DIAGNOSIS — F428 Other obsessive-compulsive disorder: Secondary | ICD-10-CM

## 2021-10-21 DIAGNOSIS — R42 Dizziness and giddiness: Secondary | ICD-10-CM

## 2021-10-21 DIAGNOSIS — G44221 Chronic tension-type headache, intractable: Secondary | ICD-10-CM | POA: Diagnosis not present

## 2021-10-21 DIAGNOSIS — F419 Anxiety disorder, unspecified: Secondary | ICD-10-CM | POA: Diagnosis not present

## 2021-10-21 NOTE — Progress Notes (Signed)
? ? ? ?Virtual Visit via Telephone Note ? ?I connected with Darrell Graham. on 10/21/21 at  2:30 PM EDT by telephone and verified that I am speaking with the correct person using two identifiers. ?  ?I discussed the limitations, risks, security and privacy concerns of performing an evaluation and management service by telephone and the availability of in person appointments. I also discussed with the patient that there may be a patient responsible charge related to this service. The patient expressed understanding and agreed to proceed. Patient is at home and I am at the office. ? ? ? ?History of Present Illness: ? ?UPDATE (10/21/21, VRP): Since last visit, doing about the same. Getting some vestibular PT. Having some issues laying down to sleep, because 1 side is sensitive from prior accident, another side triggers vertigo.  ? ?UPDATE (10/07/21, VRP): Since last visit, doing about the same, except some dizzy, nausea, floating, fog, fatigue sensation since Sep 14, 2021. Also with increasd nasal drainage. Also with increased rash on back on neck. Also with some ongoing headaches, left forehead sensitivity, puffiness (since past 15 years).  ?  ?UPDATE (09/10/20, SS): Darrell Graham is a 56 year old male with history of OCD disorder and anxiety.  He has been prescribed multiple medications from our office, but usually does not take them, due to fear of side effect or reports intolerance, including Lexapro, Effexor, gabapentin, mirtazapine and nortriptyline.  He has constant issues scratching his head, leaves sores behind.  Is currently being treated with an oral antibiotic for staph infection.  He sees dermatology.  He does not sleep well at night.  Claims he has pain to the left side of his head when lying on the left side.  Under more stress, caring for his mother who has dementia.  He works part-time doing yard work.  He is worried about his pineal cyst that has been stable, and appears benign.  He presents today  for evaluation unaccompanied. ?  ?PRIOR HPI (03/06/2020 Dr. Anne Hahn): Darrell Graham is a 56 year old right handed white male with a history of an OCD disorder and anxiety problems.  The patient indicates that he was having some panic attacks on occasion.  The patient is having constant problems with head pressure throughout the head, he still is scratching areas on his head leaving sores.  The head scratching is mainly on the left side, he cannot lay on the left in part because of this.  He has been tried in the past on Lexapro but did not tolerate it, he did not tolerate the mirtazapine, he never took Effexor, he was given a brief trial on low-dose gabapentin.  The patient is not sleeping well at night he usually.  He returns to the office today for an evaluation. ?  ? ?  ?Observations/Objective: ? ?Phone call ? ?10/13/21 MRI brain ?- normal ?- stable pineal cyst measuring 11 x 10 mm ? ? ?Assessment and Plan: ? ?Dx: ? ?1. Dizziness   ?2. Chronic tension-type headache, intractable   ?3. Anxiety   ?4. Other obsessive-compulsive disorders   ? ? ? ?DIZZINESS, NAUSEA, BRAIN FOG, FATIGUE ?- non-specific symptoms; could be related to stress, anxiety, OCD; MRI brain ruled out some neurologic issues ?- follow up with PCP ? ?PINEAL CYST ?-Incidental finding, asymptomatic; stable MRI from 10/13/2021 ? ? ?Follow Up Instructions: ? ?- No follow-ups on file. ? ?  ?I discussed the assessment and treatment plan with the patient. The patient was provided an opportunity to ask  questions and all were answered. The patient agreed with the plan and demonstrated an understanding of the instructions. ?  ?The patient was advised to call back or seek an in-person evaluation if the symptoms worsen or if the condition fails to improve as anticipated. ? ?I provided 15 minutes of non-face-to-face time during this encounter. ? ? ? ?Suanne Marker, MD 10/21/2021, 2:51 PM ?Certified in Neurology, Neurophysiology and Neuroimaging ? ?[facility] ?[address], [address] ?[place], [place] [address] ?[phone] ? ?

## 2021-10-22 ENCOUNTER — Ambulatory Visit: Payer: BC Managed Care – PPO

## 2021-10-22 ENCOUNTER — Other Ambulatory Visit: Payer: Self-pay

## 2021-10-22 DIAGNOSIS — H8111 Benign paroxysmal vertigo, right ear: Secondary | ICD-10-CM | POA: Diagnosis not present

## 2021-10-22 DIAGNOSIS — M542 Cervicalgia: Secondary | ICD-10-CM

## 2021-10-22 DIAGNOSIS — R42 Dizziness and giddiness: Secondary | ICD-10-CM | POA: Diagnosis not present

## 2021-10-22 NOTE — Therapy (Signed)
?OUTPATIENT PHYSICAL THERAPY VESTIBULAR TREATMENT NOTE ? ? ?Patient Name: Darrell Graham. ?MRN: [id number] ?DOB:[birth year], 56 y.o., male ?Today's Date: 10/22/2021 ? ?PCP: Farris Has, MD ?REFERRING PROVIDER: Suanne Marker, MD ? ? PT End of Session - 10/22/21 1102   ? ? Visit Number 2   ? Number of Visits 9   ? Date for PT Re-Evaluation 11/19/21   ? Authorization Type BCBS   ? PT Start Time 1102   ? PT Stop Time 1144   ? PT Time Calculation (min) 42 min   ? Activity Tolerance Patient tolerated treatment well   ? Behavior During Therapy [facility] for tasks assessed/performed   ? ?  ?  ? ?  ? ? ?Past Medical History:  ?Diagnosis Date  ? Allergy   ? Asthma   ? Celiac disease   ? Chronic arthropathy   ? left ankle & right elbow  ? Deviated nasal septum   ? Diverticulitis   ? Glaucoma   ? H/O vitamin D deficiency   ? Hemophilia A (HCC)   ? Hepatitis C virus   ? recent viral titer negative  ? Hernia   ? High blood pressure   ? History of pineal cyst 08/26/2018  ? OCD (obsessive compulsive disorder) 07/01/2015  ? Scoliosis   ? Telangiectasias   ? upper back and chest  ? UTI (urinary tract infection) 09/2015  ? ?Past Surgical History:  ?Procedure Laterality Date  ? ANKLE ARTHROSCOPY  1996  ? BLADDER SURGERY  2017  ? SCROTAL SURGERY    ? TRIGGER FINGER RELEASE Left   ? March 2018, bilat in 07/2021  ? URETHRA SURGERY    ? June 2018- dilation of urethra   ? ?Patient Active Problem List  ? Diagnosis Date Noted  ? History of pineal cyst 08/26/2018  ? Diverticulitis of sigmoid colon 09/25/2015  ? Colo-vesical fistula 09/25/2015  ? Sepsis (HCC) 09/22/2015  ? UTI (lower urinary tract infection) 09/22/2015  ? OCD (obsessive compulsive disorder) 07/01/2015  ? Hemophilia (HCC) 03/23/2013  ? Headache 03/23/2013  ? ? ?ONSET DATE: 09/12/2021 ? ?REFERRING DIAG: R42 (ICD-10-CM) - Dizziness  ? ?THERAPY DIAG:  ?BPPV (benign paroxysmal positional vertigo), right ? ?Dizziness and giddiness ? ?Cervicalgia ? ?PERTINENT HISTORY: OCD disorder  and anxiety, staph infection on scalp and back of neck ? ?PRECAUTIONS: None; skin ? ?SUBJECTIVE: Patient reports that felt a remarkable difference after initial evaluation. Patient reports that yesterday he bent over and started to feeling dizziness. Reports has been challenging to sleep on her L side due to back issues and L, but tried it.  ? ?PAIN:  ?Are you having pain? Yes: NPRS scale: 5/10 ?Pain location: Head (Left Side) ?Pain description: Aching/Dull ?Aggravating factors: Neck Movement ?Relieving factors: Rest ? ? ?OBJECTIVE:  ? ?POSITIONAL TESTS: ? ?Right Dix-Hallpike: upbeating, right nystagmus; Duration:5 second, very low intensity with brief. With reassessment no nystagmus noted.    ? ?VESTIBULAR TREATMENT: ? ?Canalith Repositioning: ?Epley Right: Number of Reps: 1, Response to Treatment: symptoms improved, and Comment: no symptoms with reassessment ?   ? ?Motion Sensitivity Quotient ? ?Intensity: 0 = none, 1 = Lightheaded, 2 = Mild, 3 = Moderate, 4 = Severe, 5 = Vomiting ? Intensity  ?1. Sitting to supine 0  ?2. Supine to L side 0  ?3. Supine to R side 0  ?4. Supine to sitting 1  ?5. L Hallpike-Dix 0  ?6. Up from L  1  ?7. R Hallpike-Dix 2  ?8. Up  from R  1  ?9. Sitting, head  ?tipped to L knee 0  ?10. Head up from L  ?knee 1  ?11. Sitting, head  ?tipped to R knee 0  ?12. Head up from R  ?knee 1  ?13. Sitting head turns x5 3 (reports feeling as body is swaying)  ?14.Sitting head nods x5 0 (slight sensation of sway)  ?15. In stance, 180?  ?turn to L  0 (sensation of swaying)   ?16. In stance, 180?  ?turn to R 0 (sensation of swaying)   ? ? ? [facility] 0001   ? ?  ? Functional Gait  Assessment  ? Gait assessed  Yes   ? Gait Level Surface Walks 20 ft in less than 5.5 sec, no assistive devices, good speed, no evidence for imbalance, normal gait pattern, deviates no more than 6 in outside of the 12 in walkway width.   5.3 seconds  ? Change in Gait Speed Able to smoothly change walking  speed without loss of balance or gait deviation. Deviate no more than 6 in outside of the 12 in walkway width.   ? Gait with Horizontal Head Turns Performs head turns smoothly with slight change in gait velocity (eg, minor disruption to smooth gait path), deviates 6-10 in outside 12 in walkway width, or uses an assistive device.   ? Gait with Vertical Head Turns Performs head turns with no change in gait. Deviates no more than 6 in outside 12 in walkway width.   ? Gait and Pivot Turn Pivot turns safely within 3 sec and stops quickly with no loss of balance.   ? Step Over Obstacle Is able to step over 2 stacked shoe boxes taped together (9 in total height) without changing gait speed. No evidence of imbalance.   ? Gait with Narrow Base of Support Is able to ambulate for 10 steps heel to toe with no staggering.   ? Gait with Eyes Closed Walks 20 ft, uses assistive device, slower speed, mild gait deviations, deviates 6-10 in outside 12 in walkway width. Ambulates 20 ft in less than 9 sec but greater than 7 sec.   ? Ambulating Backwards Walks 20 ft, no assistive devices, good speed, no evidence for imbalance, normal gait   ? Steps Alternating feet, no rail.   ? Total Score 28   ? FGA comment: 28/30 = Low Fall Risk   ? ?  ?  ? ?  ? ? ? ?PATIENT EDUCATION: ?Education details: Continue to sleep on L Side. Then trial slowly added in sleeping on the R side and assess response.  ?Person educated: Patient ?Education method: Explanation ?Education comprehension: verbalized understanding ?  ?GOALS: ?Goals reviewed with patient? Yes ?  ?LONG TERM GOALS: Target date: 11/19/2021 ?  ?Pt will demonstrate independence with final vestibular and cervical HEP ?Baseline:  ?Goal status: INITIAL ?  ?2.  Pt will demonstrate full resolution of R BPPV            ?Baseline:  ?Goal status: INITIAL ?  ?3.  Pt will report 0/5 dizziness on MSQ to allow for full return to daily activities and long walks outside. ?Baseline:  ?Goal status: INITIAL ?   ?4.  Pt will demonstrate 10 degree improvement in pain free, neck ROM ?Baseline: TBA ?Goal status: INITIAL ?  ?  ?  ?ASSESSMENT: ?  ?CLINICAL IMPRESSION: ?Completed reassessment of positional testing, patient initially demo very short mild nystagmus R Upbeating. Completed CRM x  1 rep, with no nystagmus or symptoms upon reassessment. Assessed MSQ, with patient describing might lightheaded sensation and feeling of postural sway. Further assessed balance with FGA, patient scoring 28/30 with only mild deficits with horizontal head turns and eyes closed. Will continue to progress toward LTGs.  ?  ?  ?OBJECTIVE IMPAIRMENTS decreased activity tolerance, decreased mobility, difficulty walking, decreased ROM, dizziness, and pain.  ?  ?ACTIVITY LIMITATIONS cleaning, community activity, meal prep, laundry, yard work, shopping, and church.  ?  ?PERSONAL FACTORS Behavior pattern, Social background, and 3+ comorbidities: see above  are also affecting patient's functional outcome.  ?  ?  ?REHAB POTENTIAL: Good ?  ?CLINICAL DECISION MAKING: Stable/uncomplicated ?  ?EVALUATION COMPLEXITY: Low ?  ?  ?PLAN: ?PT FREQUENCY: 2x/week ?  ?PT DURATION: 4 weeks ?  ?PLANNED INTERVENTIONS: Therapeutic exercises, Therapeutic activity, Neuromuscular re-education, Balance training, Patient/Family education, Vestibular training, Canalith repositioning, Spinal mobilization, Cryotherapy, Moist heat, and Manual therapy ?  ?PLAN FOR NEXT SESSION: Assess for R BPPV and treat as indicated.  Assess neck ROM. Initiate HEP focusing on neck ROM/pain and habituation to head turns. ? ? ?Tempie Donning, PT, DPT ?10/22/2021, 11:59 AM ? ? ?   ? ?

## 2021-10-24 DIAGNOSIS — M25531 Pain in right wrist: Secondary | ICD-10-CM | POA: Diagnosis not present

## 2021-10-27 ENCOUNTER — Ambulatory Visit: Payer: BC Managed Care – PPO

## 2021-10-27 ENCOUNTER — Other Ambulatory Visit: Payer: Self-pay

## 2021-10-27 DIAGNOSIS — R42 Dizziness and giddiness: Secondary | ICD-10-CM

## 2021-10-27 DIAGNOSIS — M542 Cervicalgia: Secondary | ICD-10-CM

## 2021-10-27 DIAGNOSIS — H8111 Benign paroxysmal vertigo, right ear: Secondary | ICD-10-CM | POA: Diagnosis not present

## 2021-10-27 NOTE — Patient Instructions (Signed)
Access Code: [email or web address] ?URL: [email or web address] ?Date: 10/27/2021 ?Prepared by: Jethro Bastos ? ?Exercises ?- Seated Levator Scapulae Stretch  - 1 x daily - 7 x weekly - 3 sets - 10 reps ?- Seated Cervical Sidebending Stretch  - 1 x daily - 7 x weekly - 3 sets - 10 reps ?

## 2021-10-27 NOTE — Therapy (Signed)
?OUTPATIENT PHYSICAL THERAPY VESTIBULAR TREATMENT NOTE ? ? ?Patient Name: Darrell Graham. ?MRN: [id number] ?DOB:Apr 11, 1966, 56 y.o., male ?Today's Date: 10/27/2021 ? ?PCP: Farris Has, MD ?REFERRING PROVIDER: Suanne Marker, MD ? ? PT End of Session - 10/27/21 1104   ? ? Visit Number 3   ? Number of Visits 9   ? Date for PT Re-Evaluation 11/19/21   ? Authorization Type BCBS   ? PT Start Time 1105   Pt arriving late  ? PT Stop Time 1145   ? PT Time Calculation (min) 40 min   ? Activity Tolerance Patient tolerated treatment well   ? Behavior During Therapy [facility] for tasks assessed/performed   ? ?  ?  ? ?  ? ? ?Past Medical History:  ?Diagnosis Date  ? Allergy   ? Asthma   ? Celiac disease   ? Chronic arthropathy   ? left ankle & right elbow  ? Deviated nasal septum   ? Diverticulitis   ? Glaucoma   ? H/O vitamin D deficiency   ? Hemophilia A (HCC)   ? Hepatitis C virus   ? recent viral titer negative  ? Hernia   ? High blood pressure   ? History of pineal cyst 08/26/2018  ? OCD (obsessive compulsive disorder) 07/01/2015  ? Scoliosis   ? Telangiectasias   ? upper back and chest  ? UTI (urinary tract infection) 09/2015  ? ?Past Surgical History:  ?Procedure Laterality Date  ? ANKLE ARTHROSCOPY  1996  ? BLADDER SURGERY  2017  ? SCROTAL SURGERY    ? TRIGGER FINGER RELEASE Left   ? March 2018, bilat in 07/2021  ? URETHRA SURGERY    ? June 2018- dilation of urethra   ? ?Patient Active Problem List  ? Diagnosis Date Noted  ? History of pineal cyst 08/26/2018  ? Diverticulitis of sigmoid colon 09/25/2015  ? Colo-vesical fistula 09/25/2015  ? Sepsis (HCC) 09/22/2015  ? UTI (lower urinary tract infection) 09/22/2015  ? OCD (obsessive compulsive disorder) 07/01/2015  ? Hemophilia (HCC) 03/23/2013  ? Headache 03/23/2013  ? ? ?ONSET DATE: 09/12/2021 ? ?REFERRING DIAG: R42 (ICD-10-CM) - Dizziness  ? ?THERAPY DIAG:  ?Dizziness and giddiness ? ?Cervicalgia ? ?PERTINENT HISTORY: OCD disorder and anxiety, staph infection on scalp  and back of neck ? ?PRECAUTIONS: None; skin ? ?SUBJECTIVE: Patient reports the dizziness feels better, for the most part feels much better. Began to try to lay on the R Side and felt comfortable there, no dizziness reported. Reports with the head looking down, reports that he does have some wooziness. Patient reports that he feels wooziness when scratching his neck.  ? ?PAIN:  ?Are you having pain? Yes: NPRS scale: 5/10 ?Pain location: Head (Left Side) ?Pain description: Aching/Dull ?Aggravating factors: Neck Movement ?Relieving factors: Rest ? ? ?OBJECTIVE:  ? ?Cervical AROM/PROM: ? ?A/PROM A/PROM (deg) ?10/27/2021  ?Flexion 55 (mild pain at end range)  ?Extension 58 (reports 8/10 pain)  ?Right lateral flexion 48 (moderate pain)   ?Left lateral flexion 50 (mild pain)  ?Right rotation 56 (no pain, just stiffness)   ?Left rotation 60 (no pain)  ?(Blank rows = not tested) ? ? ?POSITIONAL TESTS: ? ?Right Dix-Hallpike: none; Duration:  ?Left Dix-Hallpike: none; Duration: 0 ?Right Roll Test: none; Duration: 0 ?Left Roll Test: none; Duration: 0 ?Right Sidelying: none; Duration: 0 ?Left Sidelying: none; Duration: 0   ? ?PT  TREATMENT: ? ?Completed all of the following exercises during session and established initial HEP  for stretching of cervical region:  ? ?Access Code: [email or web address] ?URL: [email or web address] ?Date: 10/27/2021 ?Prepared by: Jethro Bastos ? ?Exercises ?- Seated Levator Scapulae Stretch  - 1 x daily - 7 x weekly - 1 sets - 3 reps - 30 seconds hold ?- Seated Cervical Sidebending Stretch  - 1 x daily - 7 x weekly - 1 sets - 3 reps - 30 seconds hold ? ?PATIENT EDUCATION: ?Education details: Sleep on R side more frequently; Initial HEP ?Person educated: Patient ?Education method: Explanation ?Education comprehension: verbalized understanding ?  ?GOALS: ?Goals reviewed with patient? Yes ?  ?LONG TERM GOALS: Target date: 11/19/2021 ?  ?Pt will demonstrate independence with final vestibular and cervical  HEP ?Baseline:  ?Goal status: INITIAL ?  ?2.  Pt will demonstrate full resolution of R BPPV            ?Baseline:  ?Goal status: INITIAL ?  ?3.  Pt will report 0/5 dizziness on MSQ to allow for full return to daily activities and long walks outside. ?Baseline:  ?Goal status: INITIAL ?  ?4.  Pt will demonstrate 10 degree improvement in pain free, neck ROM ?Baseline: TBA ?Goal status: INITIAL ?  ?  ?  ?ASSESSMENT: ?  ?CLINICAL IMPRESSION: ?Completed reassessment of all positional testing with no nystagmus or symptoms reported. Patient feels like eyes are pulsating at times, but no nystagmus. Rest of session spent assessing cervical ROM with mild impairments but increased stiffness/pain noted, therefore initiated stretching HEP.  ?  ?OBJECTIVE IMPAIRMENTS decreased activity tolerance, decreased mobility, difficulty walking, decreased ROM, dizziness, and pain.  ?  ?ACTIVITY LIMITATIONS cleaning, community activity, meal prep, laundry, yard work, shopping, and church.  ?  ?PERSONAL FACTORS Behavior pattern, Social background, and 3+ comorbidities: see above  are also affecting patient's functional outcome.  ?  ?  ?REHAB POTENTIAL: Good ?  ?CLINICAL DECISION MAKING: Stable/uncomplicated ?  ?EVALUATION COMPLEXITY: Low ?  ?  ?PLAN: ?PT FREQUENCY: 2x/week ?  ?PT DURATION: 4 weeks ?  ?PLANNED INTERVENTIONS: Therapeutic exercises, Therapeutic activity, Neuromuscular re-education, Balance training, Patient/Family education, Vestibular training, Canalith repositioning, Spinal mobilization, Cryotherapy, Moist heat, and Manual therapy ?  ?PLAN FOR NEXT SESSION: How was HEP? Add to it as needed. Reassess BPPV as needed. Continue habituation to head turns.  ? ? ?Tempie Donning, PT, DPT ?10/27/2021, 11:56 AM ? ? ?   ? ?

## 2021-10-28 ENCOUNTER — Ambulatory Visit: Payer: BC Managed Care – PPO | Admitting: Physical Therapy

## 2021-10-28 DIAGNOSIS — M79641 Pain in right hand: Secondary | ICD-10-CM | POA: Diagnosis not present

## 2021-10-28 DIAGNOSIS — M79642 Pain in left hand: Secondary | ICD-10-CM | POA: Diagnosis not present

## 2021-10-28 DIAGNOSIS — M25521 Pain in right elbow: Secondary | ICD-10-CM | POA: Diagnosis not present

## 2021-11-02 DIAGNOSIS — K922 Gastrointestinal hemorrhage, unspecified: Secondary | ICD-10-CM | POA: Diagnosis not present

## 2021-11-02 DIAGNOSIS — R935 Abnormal findings on diagnostic imaging of other abdominal regions, including retroperitoneum: Secondary | ICD-10-CM | POA: Diagnosis not present

## 2021-11-02 DIAGNOSIS — D66 Hereditary factor VIII deficiency: Secondary | ICD-10-CM | POA: Diagnosis not present

## 2021-11-02 DIAGNOSIS — D649 Anemia, unspecified: Secondary | ICD-10-CM | POA: Diagnosis not present

## 2021-11-02 DIAGNOSIS — R578 Other shock: Secondary | ICD-10-CM | POA: Diagnosis not present

## 2021-11-02 DIAGNOSIS — I728 Aneurysm of other specified arteries: Secondary | ICD-10-CM | POA: Diagnosis not present

## 2021-11-02 DIAGNOSIS — D62 Acute posthemorrhagic anemia: Secondary | ICD-10-CM | POA: Diagnosis not present

## 2021-11-02 DIAGNOSIS — I82611 Acute embolism and thrombosis of superficial veins of right upper extremity: Secondary | ICD-10-CM | POA: Diagnosis not present

## 2021-11-02 DIAGNOSIS — K900 Celiac disease: Secondary | ICD-10-CM | POA: Diagnosis not present

## 2021-11-02 DIAGNOSIS — R509 Fever, unspecified: Secondary | ICD-10-CM | POA: Diagnosis not present

## 2021-11-02 DIAGNOSIS — K3189 Other diseases of stomach and duodenum: Secondary | ICD-10-CM | POA: Diagnosis not present

## 2021-11-02 DIAGNOSIS — K264 Chronic or unspecified duodenal ulcer with hemorrhage: Secondary | ICD-10-CM | POA: Diagnosis not present

## 2021-11-02 DIAGNOSIS — I491 Atrial premature depolarization: Secondary | ICD-10-CM | POA: Diagnosis not present

## 2021-11-02 DIAGNOSIS — Z9049 Acquired absence of other specified parts of digestive tract: Secondary | ICD-10-CM | POA: Diagnosis not present

## 2021-11-02 DIAGNOSIS — I10 Essential (primary) hypertension: Secondary | ICD-10-CM | POA: Diagnosis not present

## 2021-11-02 DIAGNOSIS — R519 Headache, unspecified: Secondary | ICD-10-CM | POA: Diagnosis not present

## 2021-11-02 DIAGNOSIS — R1032 Left lower quadrant pain: Secondary | ICD-10-CM | POA: Diagnosis not present

## 2021-11-02 DIAGNOSIS — E785 Hyperlipidemia, unspecified: Secondary | ICD-10-CM | POA: Diagnosis not present

## 2021-11-02 DIAGNOSIS — R1030 Lower abdominal pain, unspecified: Secondary | ICD-10-CM | POA: Diagnosis not present

## 2021-11-02 DIAGNOSIS — K921 Melena: Secondary | ICD-10-CM | POA: Diagnosis not present

## 2021-11-02 DIAGNOSIS — E782 Mixed hyperlipidemia: Secondary | ICD-10-CM | POA: Diagnosis not present

## 2021-11-02 DIAGNOSIS — K625 Hemorrhage of anus and rectum: Secondary | ICD-10-CM | POA: Diagnosis not present

## 2021-11-02 DIAGNOSIS — N400 Enlarged prostate without lower urinary tract symptoms: Secondary | ICD-10-CM | POA: Diagnosis not present

## 2021-11-02 DIAGNOSIS — K219 Gastro-esophageal reflux disease without esophagitis: Secondary | ICD-10-CM | POA: Diagnosis not present

## 2021-11-02 DIAGNOSIS — Z20822 Contact with and (suspected) exposure to covid-19: Secondary | ICD-10-CM | POA: Diagnosis not present

## 2021-11-02 DIAGNOSIS — R58 Hemorrhage, not elsewhere classified: Secondary | ICD-10-CM | POA: Diagnosis not present

## 2021-11-02 DIAGNOSIS — L130 Dermatitis herpetiformis: Secondary | ICD-10-CM | POA: Diagnosis not present

## 2021-11-02 DIAGNOSIS — K317 Polyp of stomach and duodenum: Secondary | ICD-10-CM | POA: Diagnosis not present

## 2021-11-02 DIAGNOSIS — Z79899 Other long term (current) drug therapy: Secondary | ICD-10-CM | POA: Diagnosis not present

## 2021-11-02 DIAGNOSIS — K920 Hematemesis: Secondary | ICD-10-CM | POA: Diagnosis not present

## 2021-11-02 DIAGNOSIS — I808 Phlebitis and thrombophlebitis of other sites: Secondary | ICD-10-CM | POA: Diagnosis not present

## 2021-11-02 DIAGNOSIS — D689 Coagulation defect, unspecified: Secondary | ICD-10-CM | POA: Diagnosis not present

## 2021-11-02 DIAGNOSIS — R000 Tachycardia, unspecified: Secondary | ICD-10-CM | POA: Diagnosis not present

## 2021-11-02 DIAGNOSIS — D72829 Elevated white blood cell count, unspecified: Secondary | ICD-10-CM | POA: Diagnosis not present

## 2021-11-03 ENCOUNTER — Ambulatory Visit: Payer: BC Managed Care – PPO

## 2021-11-19 DIAGNOSIS — D66 Hereditary factor VIII deficiency: Secondary | ICD-10-CM | POA: Diagnosis not present

## 2021-11-20 DIAGNOSIS — R1032 Left lower quadrant pain: Secondary | ICD-10-CM | POA: Diagnosis not present

## 2021-11-20 DIAGNOSIS — E559 Vitamin D deficiency, unspecified: Secondary | ICD-10-CM | POA: Diagnosis not present

## 2021-11-20 DIAGNOSIS — R079 Chest pain, unspecified: Secondary | ICD-10-CM | POA: Diagnosis not present

## 2021-11-20 DIAGNOSIS — E291 Testicular hypofunction: Secondary | ICD-10-CM | POA: Diagnosis not present

## 2021-11-20 DIAGNOSIS — Z09 Encounter for follow-up examination after completed treatment for conditions other than malignant neoplasm: Secondary | ICD-10-CM | POA: Diagnosis not present

## 2021-11-20 DIAGNOSIS — K922 Gastrointestinal hemorrhage, unspecified: Secondary | ICD-10-CM | POA: Diagnosis not present

## 2021-11-20 DIAGNOSIS — R000 Tachycardia, unspecified: Secondary | ICD-10-CM | POA: Diagnosis not present

## 2021-11-25 DIAGNOSIS — R1032 Left lower quadrant pain: Secondary | ICD-10-CM | POA: Diagnosis not present

## 2021-11-27 DIAGNOSIS — D649 Anemia, unspecified: Secondary | ICD-10-CM | POA: Diagnosis not present

## 2021-12-09 DIAGNOSIS — K922 Gastrointestinal hemorrhage, unspecified: Secondary | ICD-10-CM | POA: Diagnosis not present

## 2021-12-09 DIAGNOSIS — K900 Celiac disease: Secondary | ICD-10-CM | POA: Diagnosis not present

## 2021-12-09 DIAGNOSIS — R1032 Left lower quadrant pain: Secondary | ICD-10-CM | POA: Diagnosis not present

## 2021-12-26 DIAGNOSIS — L309 Dermatitis, unspecified: Secondary | ICD-10-CM | POA: Diagnosis not present

## 2021-12-26 DIAGNOSIS — Z8619 Personal history of other infectious and parasitic diseases: Secondary | ICD-10-CM | POA: Diagnosis not present

## 2021-12-26 DIAGNOSIS — E559 Vitamin D deficiency, unspecified: Secondary | ICD-10-CM | POA: Diagnosis not present

## 2021-12-26 DIAGNOSIS — B1710 Acute hepatitis C without hepatic coma: Secondary | ICD-10-CM | POA: Diagnosis not present

## 2021-12-26 DIAGNOSIS — D649 Anemia, unspecified: Secondary | ICD-10-CM | POA: Diagnosis not present

## 2021-12-26 DIAGNOSIS — I10 Essential (primary) hypertension: Secondary | ICD-10-CM | POA: Diagnosis not present

## 2022-01-01 ENCOUNTER — Telehealth: Payer: Self-pay | Admitting: Hematology and Oncology

## 2022-01-01 NOTE — Telephone Encounter (Signed)
Scheduled appt per 5/30 referral. Pt requested to come to [facility] for appt. Pt is aware of appt date and time. Pt is aware to arrive 15 mins prior to appt time and to bring and updated insurance card. Pt is aware of appt location.

## 2022-01-05 ENCOUNTER — Encounter (HOSPITAL_BASED_OUTPATIENT_CLINIC_OR_DEPARTMENT_OTHER): Payer: Self-pay | Admitting: Cardiovascular Disease

## 2022-01-05 ENCOUNTER — Ambulatory Visit (INDEPENDENT_AMBULATORY_CARE_PROVIDER_SITE_OTHER): Payer: BC Managed Care – PPO | Admitting: Cardiovascular Disease

## 2022-01-05 VITALS — BP 162/90 | HR 107 | Ht 70.0 in | Wt 183.0 lb

## 2022-01-05 DIAGNOSIS — R000 Tachycardia, unspecified: Secondary | ICD-10-CM | POA: Diagnosis not present

## 2022-01-05 DIAGNOSIS — I10 Essential (primary) hypertension: Secondary | ICD-10-CM | POA: Diagnosis not present

## 2022-01-05 DIAGNOSIS — R079 Chest pain, unspecified: Secondary | ICD-10-CM | POA: Diagnosis not present

## 2022-01-05 HISTORY — DX: Tachycardia, unspecified: R00.0

## 2022-01-05 HISTORY — DX: Essential (primary) hypertension: I10

## 2022-01-05 HISTORY — DX: Chest pain, unspecified: R07.9

## 2022-01-05 MED ORDER — METOPROLOL TARTRATE 100 MG PO TABS: ORAL_TABLET | ORAL | 0 refills | Status: DC

## 2022-01-05 MED ORDER — IVABRADINE HCL 5 MG PO TABS: ORAL_TABLET | ORAL | 0 refills | Status: DC

## 2022-01-05 NOTE — Assessment & Plan Note (Signed)
Darrell Graham is having exertional chest pain.  Given his hemophilia, PCI is a last resort.  We will get a coronary CT-A to better understand whether he has obstructive coronary disease.  We discussed the possibility of starting beta blockers, especially for his tachycardia.  He is hesitant.  We will re-evaluate once we know his coronary status.  Will give metoprolol 100mg  and ivabradine 5mg  prior to his scan.

## 2022-01-05 NOTE — Assessment & Plan Note (Addendum)
Blood pressure above goal of 130/80 today both initially and on repeat.  He will track it at home.  Continue amlodipine and telminsartan.  Consider beta blocker based on echo and CT results.

## 2022-01-05 NOTE — Progress Notes (Signed)
Cardiology Office Note:    Date:  01/05/2022   ID:  Darrell Frohlich., DOB August 21, 1965, MRN [id number]  PCP:  London Pepper, MD   [place] Providers Cardiologist:  None     Referring MD: London Pepper, MD   No chief complaint on file.   History of Present Illness:    Darrell Poupard. is a 56 y.o. male with a hx of hypertension, asthma, celiac disease, hemophilia A, hepatitis C, telangiectasia, and OCD, here for the evaluation of exertional chest pain. He was seen by Dr. Orland Mustard on 11/20/2021 for follow-up of his recent hospitalization/SNF stay 4/2 - 4/12 due to gastrointestinal hemorrhage. While in the hospital his hemoglobin dropped to 7.6. CT scan of the abdomen showed minimal abdominal atherosclerosis. At his visit with Dr. Orland Mustard he reported recent episodes of chest pain with exertion. He was referred to cardiology for further evaluation. He had carotid dopplers 12/2018 with mild stenosis bilaterally. Today, he reports that his chest discomfort seems to have subsided somewhat but is still present. Initially he was feeling severe chest pain with activity, worsened if he continued his activity. Lately his pain is less severe but still concerning. The other day his pain recurred while using a push-mower to mow the yard. By the time he finished the sharp pain occurred again. This morning he had a very minor chest pain after yawning. Generally his chest pain is always with exertion, and always localized to the same region of his central chest. At this time his chest pain is usually occurring every few days. He denies associated nausea. However, he does endorse other nausea that he attributes to his iron infusions. Additionally, sometimes when stepping out of the shower he will notice stars floating around in his vision. Lately he has not monitored his at home blood pressures. For a time he had discontinued his medications after his hospitalization. After seeing blood pressures in the 160's he  restarted his telmisartan and amlodipine. He notes that his diastolic pressures have always been elevated. Ever since his fistula surgery 6 years ago he began noticing his heart rate would stay in the low 100's. For activity he sometimes enjoys fishing and may work around the house. He will also try to walk around the block for exercise. Typically he drinks one to two bottles of [place] tea at the end of the day. His alcohol consumption is very rare, maybe once a year. Infrequently, he will feel a jolt as he is falling asleep, almost feeling like his heart may have stopped for a second. He has been told that he snores. 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 He denies any shortness of breath, or peripheral edema. No lightheadedness, syncope, orthopnea, or PND.   Past Medical History:  Diagnosis Date   Allergy    Asthma    Celiac disease    Chest pain of uncertain etiology 11/09/163   Chronic arthropathy    left ankle & right elbow   Deviated nasal septum    Diverticulitis    Essential hypertension 01/05/2022   Glaucoma    H/O vitamin D deficiency    Hemophilia A ([place])    Hepatitis C virus    recent viral titer negative   Hernia    High blood pressure    History of pineal cyst 08/26/2018   OCD (obsessive compulsive disorder) 07/01/2015   Scoliosis    Tachycardia 01/05/2022   Telangiectasias    upper  back and chest   UTI (urinary tract infection) 09/2015    Past Surgical History:  Procedure Laterality Date   ANKLE ARTHROSCOPY  1996   BLADDER SURGERY  2017   SCROTAL SURGERY     TRIGGER FINGER RELEASE Left    March 2018, bilat in 07/2021   URETHRA SURGERY     June 2018- dilation of urethra     Current Medications: Current Meds  Medication Sig   ALPRAZolam (XANAX) 0.25 MG tablet Take 0.25 mg by mouth at bedtime as needed for anxiety.   amLODipine (NORVASC) 5 MG tablet Take 5 mg by mouth daily.   Antihemophilic Factor rAHF-PAF (XYNTHA SOLOFUSE) 3000 UNITS  KIT Inject 3,000 Units into the vein as needed. (Patient taking differently: Inject 3,000 Units into the vein as needed (for bleeding).)   Emicizumab-kxwh (HEMLIBRA [place]) Inject into the skin.   HYDROcodone-acetaminophen (NORCO/VICODIN) 5-325 MG tablet Take 1-2 tablets by mouth every 6 (six) hours as needed for moderate pain or severe pain.   ivabradine (CORLANOR) 5 MG TABS tablet Take 1 tablet 2 hours prior to CT   lidocaine (LIDODERM) 5 % lidocaine 5 % topical patch  APPLY 1 2 PATCH TO INTACT SKIN ONCE A DAY. REMOVE AFTER 12 HOURS AS NEEDED   metoprolol tartrate (LOPRESSOR) 100 MG tablet Take 1 tablet 2 hours prior to CT   Naphazoline-Pheniramine 0.027-0.315 % SOLN Apply 1 drop to eye daily as needed (for dry eyes).   pantoprazole (PROTONIX) 40 MG tablet Take 40 mg daily by mouth.   telmisartan (MICARDIS) 40 MG tablet Take 40 mg by mouth daily.   [DISCONTINUED] losartan (COZAAR) 25 MG tablet SMARTSIG:1 By Mouth     Allergies:   Aspirin, Doxycycline, Hemophilia support [acid blockers support], Codeine, Gluten meal, Tetracycline hcl, Tetracyclines & related, and Wheat bran   Social History   Socioeconomic History   Marital status: Single    Spouse name: Not on file   Number of children: 0   Years of education: 12   Highest education level: Not on file  Occupational History    Comment: Not working at this time.  Tobacco Use   Smoking status: Never   Smokeless tobacco: Never  Substance and Sexual Activity   Alcohol use: No   Drug use: No   Sexual activity: Not on file  Other Topics Concern   Not on file  Social History Narrative   Patient is single and lives at home with his mother. Patient has a high school education.   Caffeine- 1-2 daily.   Right handed.   Social Determinants of Health   Financial Resource Strain: Not on file  Food Insecurity: Not on file  Transportation Needs: Not on file  Physical Activity: Not on file  Stress: Not on file  Social Connections: Not on  file     Family History: The patient's family history includes Bradycardia in his mother; Dementia in his mother; GER disease in an other family member; Heart disease in his father; Heart failure in his mother; Hemophilia in an other family member; Hepatitis C in an other family member; Hypertension in his father; Migraines in his father; Osteopenia in an other family member; Psoriasis in an other family member; Stroke in his father and maternal uncle.  ROS:   Please see the history of present illness.    (+) Exertional chest pain (+) Nausea (+) Visual disturbances (+) Snoring (+) Stress All other systems reviewed and are negative.  EKGs/Labs/Other Studies Reviewed:  The following studies were reviewed today:  Bilateral Carotid Duplex  12/07/2018: Summary:  Right Carotid: Velocities in the right ICA are consistent with a 1-39%  stenosis.   Left Carotid: Velocities in the left ICA are consistent with a 1-39%  stenosis.   Vertebrals:  Bilateral vertebral arteries demonstrate antegrade flow.  Subclavians: Normal flow hemodynamics were seen in bilateral subclavian arteries.    EKG:   EKG is personally reviewed. 01/05/2022: Sinus tachycardia. Rate 107 bpm.  Recent Labs: No results found for requested labs within last 8760 hours.   Recent Lipid Panel No results found for: CHOL, TRIG, HDL, CHOLHDL, VLDL, LDLCALC, LDLDIRECT   Risk Assessment/Calculations:           Physical Exam:    Wt Readings from Last 3 Encounters:  01/05/22 183 lb (83 kg)  10/07/21 186 lb (84.4 kg)  09/10/20 180 lb (81.6 kg)     VS:  BP (!) 162/90   Pulse (!) 107   Ht '5\' 10"'  (1.778 m)   Wt 183 lb (83 kg)   BMI 26.26 kg/m  , BMI Body mass index is 26.26 kg/m. GENERAL:  Well appearing HEENT: Pupils equal round and reactive, fundi not visualized, oral mucosa unremarkable NECK:  No jugular venous distention, waveform within normal limits, carotid upstroke brisk and symmetric, no bruits, no  thyromegaly LYMPHATICS:  No cervical adenopathy LUNGS:  Clear to auscultation bilaterally HEART:  RRR.  PMI not displaced or sustained,S1 and S2 within normal limits, no S3, no S4, no clicks, no rubs, no murmurs ABD:  Flat, positive bowel sounds normal in frequency in pitch, no bruits, no rebound, no guarding, no midline pulsatile mass, no hepatomegaly, no splenomegaly EXT:  2 plus pulses throughout, no edema, no cyanosis no clubbing SKIN:  No rashes no nodules NEURO:  Cranial nerves II through XII grossly intact, motor grossly intact throughout PSYCH:  Cognitively intact, oriented to person place and time   ASSESSMENT:    1. Chest pain of uncertain etiology   2. Tachycardia   3. Essential hypertension    PLAN:    Chest pain of uncertain etiology Darrell Graham is having exertional chest pain.  Given his hemophilia, PCI is a last resort.  We will get a coronary CT-A to better understand whether he has obstructive coronary disease.  We discussed the possibility of starting beta blockers, especially for his tachycardia.  He is hesitant.  We will re-evaluate once we know his coronary status.  Will give metoprolol 173m and ivabradine 535mprior to his scan.  Essential hypertension Blood pressure above goal of 130/80 today both initially and on repeat.  He will track it at home.  Continue amlodipine and telminsartan.  Consider beta blocker based on echo and CT results.    Tachycardia Sinus tachycardia noted on EKG.  He notes palpitations.  Check CBC and TSH. He will work on limiting caffeine.  Stress and anxiety are contributing as he also cares for his mother with dementia. Disposition: FU with APP in 1 month.  Medication Adjustments/Labs and Tests Ordered: Current medicines are reviewed at length with the patient today.  Concerns regarding medicines are outlined above.   Orders Placed This Encounter  Procedures   CT CORONARY MORPH W/CTA COR W/SCORE W/CA W/CM &/OR WO/CM   TSH   CBC  with Differential/Platelet   Basic metabolic panel   EKG 1232-TFTD ECHOCARDIOGRAM COMPLETE   Meds ordered this encounter  Medications   ivabradine (CORLANOR) 5 MG TABS tablet  Sig: Take 1 tablet 2 hours prior to CT    Dispense:  1 tablet    Refill:  0   metoprolol tartrate (LOPRESSOR) 100 MG tablet    Sig: Take 1 tablet 2 hours prior to CT    Dispense:  1 tablet    Refill:  0   Patient Instructions  Medication Instructions:  TAKE CORLANOR 5 MG 1 TABLET 2 HOURS PRIOR TO CT   TAKE METOPROLOL 100 MG 1 TABLET 2 HOURS PRIOR TO CT   *If you need a refill on your cardiac medications before your next appointment, please call your pharmacy*   Lab Work: BMET/CBC/TSH SOON   If you have labs (blood work) drawn today and your tests are completely normal, you will receive your results only by: MyChart Message (if you have MyChart) OR A paper copy in the mail If you have any lab test that is abnormal or we need to change your treatment, we will call you to review the results.   Testing/Procedures: Your physician has requested that you have cardiac CT. Cardiac computed tomography (CT) is a painless test that uses an x-ray machine to take clear, detailed pictures of your heart. For further information please visit [email or web address]. Please follow instruction sheet as given.  Your physician has requested that you have an echocardiogram. Echocardiography is a painless test that uses sound waves to create images of your heart. It provides your doctor with information about the size and shape of your heart and how well your heart's chambers and valves are working. This procedure takes approximately one hour. There are no restrictions for this procedure.  Follow-Up: At [facility], you and your health needs are our priority.  As part of our continuing mission to provide you with exceptional heart care, we have created designated Provider Care Teams.  These Care Teams include your primary  Cardiologist (physician) and Advanced Practice Providers (APPs -  Physician Assistants and Nurse Practitioners) who all work together to provide you with the care you need, when you need it.  We recommend signing up for the patient portal called "MyChart".  Sign up information is provided on this After Visit Summary.  MyChart is used to connect with patients for Virtual Visits (Telemedicine).  Patients are able to view lab/test results, encounter notes, upcoming appointments, etc.  Non-urgent messages can be sent to your provider as well.   To learn more about what you can do with MyChart, go to [email or web address].    Your next appointment:   1 month(s)  The format for your next appointment:   In Person  Provider:   Laurann Montana, [facility]  Other Instructions   Your cardiac CT will be scheduled at one of the below locations:   [facility] [address] [place], [place] [address] [phone]  [place] [address] [place], [place] [address] [phone]  If scheduled at [facility], please arrive at the [facility] (Entrance C2) of [facility] 30 minutes prior to test start time. You can use the FREE valet parking offered at entrance C (encouraged to control the heart rate for the test)  Proceed to the [facility]iology Department (first floor) to check-in and test prep.  All radiology patients and guests should use entrance C2 at [facility], accessed from [facility], even though the hospital's physical address listed is [address].    If scheduled  at [facility], please arrive 15 mins early for check-in and test prep.  Please follow these instructions carefully (unless otherwise directed):  Hold all erectile dysfunction medications at least 3 days (72 hrs) prior to test.  On the Night Before the Test: Be  sure to Drink plenty of water. Do not consume any caffeinated/decaffeinated beverages or chocolate 12 hours prior to your test. Do not take any antihistamines 12 hours prior to your test. If the patient has contrast allergy: Patient will need a prescription for Prednisone and very clear instructions (as follows): Prednisone 50 mg - take 13 hours prior to test Take another Prednisone 50 mg 7 hours prior to test Take another Prednisone 50 mg 1 hour prior to test Take Benadryl 50 mg 1 hour prior to test Patient must complete all four doses of above prophylactic medications. Patient will need a ride after test due to Benadryl.  On the Day of the Test: Drink plenty of water until 1 hour prior to the test. Do not eat any food 4 hours prior to the test. You may take your regular medications prior to the test.  Take metoprolol (Lopressor) two hours prior to test. HOLD Furosemide/Hydrochlorothiazide morning of the test. FEMALES- please wear underwire-free bra if available, avoid dresses & tight clothing      After the Test: Drink plenty of water. After receiving IV contrast, you may experience a mild flushed feeling. This is normal. On occasion, you may experience a mild rash up to 24 hours after the test. This is not dangerous. If this occurs, you can take Benadryl 25 mg and increase your fluid intake. If you experience trouble breathing, this can be serious. If it is severe call 911 IMMEDIATELY. If it is mild, please call our office. If you take any of these medications: Glipizide/Metformin, Avandament, Glucavance, please do not take 48 hours after completing test unless otherwise instructed.  We will call to schedule your test 2-4 weeks out understanding that some insurance companies will need an authorization prior to the service being performed.   For non-scheduling related questions, please contact the cardiac imaging nurse navigator should you have any questions/concerns: Marchia Bond,  Cardiac Imaging Nurse Navigator Gordy Clement, Cardiac Imaging Nurse Navigator [facility] Direct Office Dial: [phone]   For scheduling needs, including cancellations and rescheduling, please call [place], [phone].  Cardiac CT Angiogram A cardiac CT angiogram is a procedure to look at the heart and the area around the heart. It may be done to help find the cause of chest pains or other symptoms of heart disease. During this procedure, a substance called contrast dye is injected into the blood vessels in the area to be checked. A large X-ray machine, called a CT scanner, then takes detailed pictures of the heart and the surrounding area. The procedure is also sometimes called a coronary CT angiogram, coronary artery scanning, or CTA. A cardiac CT angiogram allows the health care provider to see how well blood is flowing to and from the heart. The health care provider will be able to see if there are any problems, such as: Blockage or narrowing of the coronary arteries in the heart. Fluid around the heart. Signs of weakness or disease in the muscles, valves, and tissues of the heart. Tell a health care provider about: Any allergies you have. This is especially important if you have had a previous allergic reaction to contrast dye. All medicines you are taking, including vitamins, herbs,  eye drops, creams, and over-the-counter medicines. Any blood disorders you have. Any surgeries you have had. Any medical conditions you have. Whether you are pregnant or may be pregnant. Any anxiety disorders, chronic pain, or other conditions you have that may increase your stress or prevent you from lying still. What are the risks? Generally, this is a safe procedure. However, problems may occur, including: Bleeding. Infection. Allergic reactions to medicines or dyes. Damage to other structures or organs. Kidney damage from the contrast dye that is used. Increased  risk of cancer from radiation exposure. This risk is low. Talk with your health care provider about: The risks and benefits of testing. How you can receive the lowest dose of radiation. What happens before the procedure? Wear comfortable clothing and remove any jewelry, glasses, dentures, and hearing aids. Follow instructions from your health care provider about eating and drinking. This may include: For 12 hours before the procedure -- avoid caffeine. This includes tea, coffee, soda, energy drinks, and diet pills. Drink plenty of water or other fluids that do not have caffeine in them. Being well hydrated can prevent complications. For 4-6 hours before the procedure -- stop eating and drinking. The contrast dye can cause nausea, but this is less likely if your stomach is empty. Ask your health care provider about changing or stopping your regular medicines. This is especially important if you are taking diabetes medicines, blood thinners, or medicines to treat problems with erections (erectile dysfunction). What happens during the procedure?  Hair on your chest may need to be removed so that small sticky patches called electrodes can be placed on your chest. These will transmit information that helps to monitor your heart during the procedure. An IV will be inserted into one of your veins. You might be given a medicine to control your heart rate during the procedure. This will help to ensure that good images are obtained. You will be asked to lie on an exam table. This table will slide in and out of the CT machine during the procedure. Contrast dye will be injected into the IV. You might feel warm, or you may get a metallic taste in your mouth. You will be given a medicine called nitroglycerin. This will relax or dilate the arteries in your heart. The table that you are lying on will move into the CT machine tunnel for the scan. The person running the machine will give you instructions while the  scans are being done. You may be asked to: Keep your arms above your head. Hold your breath. Stay very still, even if the table is moving. When the scanning is complete, you will be moved out of the machine. The IV will be removed. The procedure may vary among health care providers and hospitals. What can I expect after the procedure? After your procedure, it is common to have: A metallic taste in your mouth from the contrast dye. A feeling of warmth. A headache from the nitroglycerin. Follow these instructions at home: Take over-the-counter and prescription medicines only as told by your health care provider. If you are told, drink enough fluid to keep your urine pale yellow. This will help to flush the contrast dye out of your body. Most people can return to their normal activities right after the procedure. Ask your health care provider what activities are safe for you. It is up to you to get the results of your procedure. Ask your health care provider, or the department that is doing the procedure,  when your results will be ready. Keep all follow-up visits as told by your health care provider. This is important. Contact a health care provider if: You have any symptoms of allergy to the contrast dye. These include: Shortness of breath. Rash or hives. A racing heartbeat. Summary A cardiac CT angiogram is a procedure to look at the heart and the area around the heart. It may be done to help find the cause of chest pains or other symptoms of heart disease. During this procedure, a large X-ray machine, called a CT scanner, takes detailed pictures of the heart and the surrounding area after a contrast dye has been injected into blood vessels in the area. Ask your health care provider about changing or stopping your regular medicines before the procedure. This is especially important if you are taking diabetes medicines, blood thinners, or medicines to treat erectile dysfunction. If you are  told, drink enough fluid to keep your urine pale yellow. This will help to flush the contrast dye out of your body. This information is not intended to replace advice given to you by your health care provider. Make sure you discuss any questions you have with your health care provider. Document Revised: 04/02/2021 Document Reviewed: 03/15/2019 Elsevier Patient Education  [employer].         I,Mathew Stumpf,acting as a Education administrator for Skeet Latch, MD.,have documented all relevant documentation on the behalf of Skeet Latch, MD,as directed by  Skeet Latch, MD while in the presence of Skeet Latch, MD.  I, [place] Oval Linsey, MD have reviewed all documentation for this visit.  The documentation of the exam, diagnosis, procedures, and orders on 01/05/2022 are all accurate and complete.   Signed, Skeet Latch, MD  01/05/2022 5:45 PM    [place]

## 2022-01-05 NOTE — Patient Instructions (Addendum)
Medication Instructions:  TAKE CORLANOR 5 MG 1 TABLET 2 HOURS PRIOR TO CT   TAKE METOPROLOL 100 MG 1 TABLET 2 HOURS PRIOR TO CT   *If you need a refill on your cardiac medications before your next appointment, please call your pharmacy*   Lab Work: BMET/CBC/TSH SOON   If you have labs (blood work) drawn today and your tests are completely normal, you will receive your results only by: MyChart Message (if you have MyChart) OR A paper copy in the mail If you have any lab test that is abnormal or we need to change your treatment, we will call you to review the results.   Testing/Procedures: Your physician has requested that you have cardiac CT. Cardiac computed tomography (CT) is a painless test that uses an x-ray machine to take clear, detailed pictures of your heart. For further information please visit [email or web address]. Please follow instruction sheet as given.  Your physician has requested that you have an echocardiogram. Echocardiography is a painless test that uses sound waves to create images of your heart. It provides your doctor with information about the size and shape of your heart and how well your heart's chambers and valves are working. This procedure takes approximately one hour. There are no restrictions for this procedure.  Follow-Up: At [facility], you and your health needs are our priority.  As part of our continuing mission to provide you with exceptional heart care, we have created designated Provider Care Teams.  These Care Teams include your primary Cardiologist (physician) and Advanced Practice Providers (APPs -  Physician Assistants and Nurse Practitioners) who all work together to provide you with the care you need, when you need it.  We recommend signing up for the patient portal called "MyChart".  Sign up information is provided on this After Visit Summary.  MyChart is used to connect with patients for Virtual Visits (Telemedicine).  Patients are able to view  lab/test results, encounter notes, upcoming appointments, etc.  Non-urgent messages can be sent to your provider as well.   To learn more about what you can do with MyChart, go to [email or web address].    Your next appointment:   1 month(s)  The format for your next appointment:   In Person  Provider:   Gillian Shieldsaitlin Walker, [facility]{  Other Instructions   Your cardiac CT will be scheduled at one of the below locations:   [facility] [address] [place], [place] [address] [phone]  OR  [facility] [address] [place], [place] [address] [phone]  If scheduled at [facility], please arrive at the [facility] (Entrance C2) of [facility] 30 minutes prior to test start time. You can use the FREE valet parking offered at entrance C (encouraged to control the heart rate for the test)  Proceed to the [facility] Radiology Department (first floor) to check-in and test prep.  All radiology patients and guests should use entrance C2 at [facility], accessed from [facility], even though the hospital's physical address listed is [address].    If scheduled at [facility], please arrive 15 mins early for check-in and test prep.  Please follow these instructions carefully (unless otherwise directed):  Hold all erectile dysfunction medications at least 3 days (72 hrs) prior to test.  On the Night Before the Test: Be sure to Drink plenty of water. Do not consume any caffeinated/decaffeinated beverages or  chocolate 12 hours prior to your test. Do not take any antihistamines 12 hours prior to your test. If the patient has contrast allergy: Patient will need a prescription for Prednisone and very clear instructions (as follows): Prednisone 50 mg - take 13 hours prior to test Take another Prednisone 50 mg 7 hours prior to  test Take another Prednisone 50 mg 1 hour prior to test Take Benadryl 50 mg 1 hour prior to test Patient must complete all four doses of above prophylactic medications. Patient will need a ride after test due to Benadryl.  On the Day of the Test: Drink plenty of water until 1 hour prior to the test. Do not eat any food 4 hours prior to the test. You may take your regular medications prior to the test.  Take metoprolol (Lopressor) two hours prior to test. HOLD Furosemide/Hydrochlorothiazide morning of the test. FEMALES- please wear underwire-free bra if available, avoid dresses & tight clothing      After the Test: Drink plenty of water. After receiving IV contrast, you may experience a mild flushed feeling. This is normal. On occasion, you may experience a mild rash up to 24 hours after the test. This is not dangerous. If this occurs, you can take Benadryl 25 mg and increase your fluid intake. If you experience trouble breathing, this can be serious. If it is severe call 911 IMMEDIATELY. If it is mild, please call our office. If you take any of these medications: Glipizide/Metformin, Avandament, Glucavance, please do not take 48 hours after completing test unless otherwise instructed.  We will call to schedule your test 2-4 weeks out understanding that some insurance companies will need an authorization prior to the service being performed.   For non-scheduling related questions, please contact the cardiac imaging nurse navigator should you have any questions/concerns: Rockwell Alexandria, Cardiac Imaging Nurse Navigator Larey Brick, Cardiac Imaging Nurse Navigator [facility] Direct Office Dial: [phone]   For scheduling needs, including cancellations and rescheduling, please call [place], [phone].  Cardiac CT Angiogram A cardiac CT angiogram is a procedure to look at the heart and the area around the heart. It may be done to help find the cause of  chest pains or other symptoms of heart disease. During this procedure, a substance called contrast dye is injected into the blood vessels in the area to be checked. A large X-ray machine, called a CT scanner, then takes detailed pictures of the heart and the surrounding area. The procedure is also sometimes called a coronary CT angiogram, coronary artery scanning, or CTA. A cardiac CT angiogram allows the health care provider to see how well blood is flowing to and from the heart. The health care provider will be able to see if there are any problems, such as: Blockage or narrowing of the coronary arteries in the heart. Fluid around the heart. Signs of weakness or disease in the muscles, valves, and tissues of the heart. Tell a health care provider about: Any allergies you have. This is especially important if you have had a previous allergic reaction to contrast dye. All medicines you are taking, including vitamins, herbs, eye drops, creams, and over-the-counter medicines. Any blood disorders you have. Any surgeries you have had. Any medical conditions you have. Whether you are pregnant or may be pregnant. Any anxiety disorders, chronic pain, or other conditions you have that may increase your stress or prevent you from lying still. What are the risks? Generally, this is a safe procedure.  However, problems may occur, including: Bleeding. Infection. Allergic reactions to medicines or dyes. Damage to other structures or organs. Kidney damage from the contrast dye that is used. Increased risk of cancer from radiation exposure. This risk is low. Talk with your health care provider about: The risks and benefits of testing. How you can receive the lowest dose of radiation. What happens before the procedure? Wear comfortable clothing and remove any jewelry, glasses, dentures, and hearing aids. Follow instructions from your health care provider about eating and drinking. This may include: For 12  hours before the procedure -- avoid caffeine. This includes tea, coffee, soda, energy drinks, and diet pills. Drink plenty of water or other fluids that do not have caffeine in them. Being well hydrated can prevent complications. For 4-6 hours before the procedure -- stop eating and drinking. The contrast dye can cause nausea, but this is less likely if your stomach is empty. Ask your health care provider about changing or stopping your regular medicines. This is especially important if you are taking diabetes medicines, blood thinners, or medicines to treat problems with erections (erectile dysfunction). What happens during the procedure?  Hair on your chest may need to be removed so that small sticky patches called electrodes can be placed on your chest. These will transmit information that helps to monitor your heart during the procedure. An IV will be inserted into one of your veins. You might be given a medicine to control your heart rate during the procedure. This will help to ensure that good images are obtained. You will be asked to lie on an exam table. This table will slide in and out of the CT machine during the procedure. Contrast dye will be injected into the IV. You might feel warm, or you may get a metallic taste in your mouth. You will be given a medicine called nitroglycerin. This will relax or dilate the arteries in your heart. The table that you are lying on will move into the CT machine tunnel for the scan. The person running the machine will give you instructions while the scans are being done. You may be asked to: Keep your arms above your head. Hold your breath. Stay very still, even if the table is moving. When the scanning is complete, you will be moved out of the machine. The IV will be removed. The procedure may vary among health care providers and hospitals. What can I expect after the procedure? After your procedure, it is common to have: A metallic taste in your  mouth from the contrast dye. A feeling of warmth. A headache from the nitroglycerin. Follow these instructions at home: Take over-the-counter and prescription medicines only as told by your health care provider. If you are told, drink enough fluid to keep your urine pale yellow. This will help to flush the contrast dye out of your body. Most people can return to their normal activities right after the procedure. Ask your health care provider what activities are safe for you. It is up to you to get the results of your procedure. Ask your health care provider, or the department that is doing the procedure, when your results will be ready. Keep all follow-up visits as told by your health care provider. This is important. Contact a health care provider if: You have any symptoms of allergy to the contrast dye. These include: Shortness of breath. Rash or hives. A racing heartbeat. Summary A cardiac CT angiogram is a procedure to look at the heart  and the area around the heart. It may be done to help find the cause of chest pains or other symptoms of heart disease. During this procedure, a large X-ray machine, called a CT scanner, takes detailed pictures of the heart and the surrounding area after a contrast dye has been injected into blood vessels in the area. Ask your health care provider about changing or stopping your regular medicines before the procedure. This is especially important if you are taking diabetes medicines, blood thinners, or medicines to treat erectile dysfunction. If you are told, drink enough fluid to keep your urine pale yellow. This will help to flush the contrast dye out of your body. This information is not intended to replace advice given to you by your health care provider. Make sure you discuss any questions you have with your health care provider. Document Revised: 04/02/2021 Document Reviewed: 03/15/2019 Elsevier Patient Education  2023 [employer].

## 2022-01-05 NOTE — Assessment & Plan Note (Signed)
Sinus tachycardia noted on EKG.  He notes palpitations.  Check CBC and TSH. He will work on limiting caffeine.  Stress and anxiety are contributing as he also cares for his mother with dementia.

## 2022-01-07 DIAGNOSIS — R079 Chest pain, unspecified: Secondary | ICD-10-CM | POA: Diagnosis not present

## 2022-01-07 DIAGNOSIS — R000 Tachycardia, unspecified: Secondary | ICD-10-CM | POA: Diagnosis not present

## 2022-01-08 LAB — BASIC METABOLIC PANEL
BUN/Creatinine Ratio: 13 (ref 9–20)
BUN: 11 mg/dL (ref 6–24)
CO2: 22 mmol/L (ref 20–29)
Calcium: 9.6 mg/dL (ref 8.7–10.2)
Chloride: 104 mmol/L (ref 96–106)
Creatinine, Ser: 0.82 mg/dL (ref 0.76–1.27)
Glucose: 115 mg/dL — ABNORMAL HIGH (ref 70–99)
Potassium: 4.2 mmol/L (ref 3.5–5.2)
Sodium: 142 mmol/L (ref 134–144)
eGFR: 104 mL/min/{1.73_m2} (ref >59)

## 2022-01-08 LAB — TSH: TSH: 1.07 u[IU]/mL (ref 0.450–4.500)

## 2022-01-08 LAB — CBC WITH DIFFERENTIAL/PLATELET
Basophils Absolute: 0.1 10*3/uL (ref 0.0–0.2)
Basos: 1 %
EOS (ABSOLUTE): 0.1 10*3/uL (ref 0.0–0.4)
Eos: 2 %
Hematocrit: 37.1 % — ABNORMAL LOW (ref 37.5–51.0)
Hemoglobin: 12.1 g/dL — ABNORMAL LOW (ref 13.0–17.7)
Immature Grans (Abs): 0 10*3/uL (ref 0.0–0.1)
Immature Granulocytes: 0 %
Lymphocytes Absolute: 1.5 10*3/uL (ref 0.7–3.1)
Lymphs: 30 %
MCH: 26.2 pg — ABNORMAL LOW (ref 26.6–33.0)
MCHC: 32.6 g/dL (ref 31.5–35.7)
MCV: 81 fL (ref 79–97)
Monocytes Absolute: 0.8 10*3/uL (ref 0.1–0.9)
Monocytes: 15 %
Neutrophils Absolute: 2.7 10*3/uL (ref 1.4–7.0)
Neutrophils: 52 %
Platelets: 354 10*3/uL (ref 150–450)
RBC: 4.61 x10E6/uL (ref 4.14–5.80)
RDW: 14.6 % (ref 11.6–15.4)
WBC: 5.1 10*3/uL (ref 3.4–10.8)

## 2022-01-09 ENCOUNTER — Telehealth: Payer: Self-pay | Admitting: Cardiovascular Disease

## 2022-01-09 NOTE — Telephone Encounter (Signed)
Preliminary Results in, not yet resulted by MD. RN called patient and updated of this, no answer, LM with the above information.

## 2022-01-09 NOTE — Telephone Encounter (Signed)
Requesting a call back in regards to his labs.

## 2022-01-13 ENCOUNTER — Telehealth: Payer: Self-pay | Admitting: Cardiovascular Disease

## 2022-01-13 NOTE — Telephone Encounter (Signed)
Pt calling to f/u on test results. Pt states that this is the second time calling and he feels like his counts have dropped, this is the reason for wanting results. Please advise

## 2022-01-13 NOTE — Telephone Encounter (Signed)
FYI for when results are released  

## 2022-01-14 NOTE — Telephone Encounter (Signed)
-----   Message from Chilton Si, MD sent at 01/14/2022 11:20 AM EDT ----- He is still anemic but his hemoglobin is higher than it has been in 6 years. Normal kidney function and electrolytes.

## 2022-01-14 NOTE — Telephone Encounter (Signed)
Advised patient of lab results  

## 2022-01-16 ENCOUNTER — Other Ambulatory Visit: Payer: Self-pay | Admitting: Nurse Practitioner

## 2022-01-16 DIAGNOSIS — D649 Anemia, unspecified: Secondary | ICD-10-CM

## 2022-01-19 ENCOUNTER — Encounter: Payer: BC Managed Care – PPO | Admitting: Hematology and Oncology

## 2022-01-19 ENCOUNTER — Other Ambulatory Visit: Payer: BC Managed Care – PPO

## 2022-01-20 ENCOUNTER — Ambulatory Visit (INDEPENDENT_AMBULATORY_CARE_PROVIDER_SITE_OTHER): Payer: BC Managed Care – PPO

## 2022-01-20 DIAGNOSIS — R000 Tachycardia, unspecified: Secondary | ICD-10-CM

## 2022-01-20 DIAGNOSIS — R079 Chest pain, unspecified: Secondary | ICD-10-CM | POA: Diagnosis not present

## 2022-01-20 LAB — ECHOCARDIOGRAM COMPLETE
Area-P 1/2: 4.26 cm2
S' Lateral: 3.97 cm

## 2022-01-20 NOTE — Progress Notes (Unsigned)
New Hematology/Oncology Consult   Requesting MD: Dr. London Pepper  [phone]      Reason for Consult: Low iron  HPI: Darrell Graham is a 56 year old man referred by Dr. Orland Mustard for evaluation of low iron.  Labs from 12/09/2021 with hemoglobin 11.0, MCV 85, white count 4.4, platelet count 395,000, iron 26 (45-182), ferritin 18 (23-340), TIBC 435 (250-435), percent saturation 6 (15-50%).  Darrell Graham was hospitalized 11/02/2021 with a GI bleed.  He underwent embolization of gastroduodenal and pancreaticoduodenal arteries.  He received blood transfusions and factor VIII replacement therapy.  He was discharged home 11/12/2021  Dr. Benay Spice has seen Darrell Graham in the past for hemophilia A, last office visit 06/15/2017.  Subsequent follow-up has been with the hemophilia clinic at [facility].      Past Medical History:  Diagnosis Date   Allergy    Asthma    Celiac disease    Chest pain of uncertain etiology 11/02/3242   Chronic arthropathy    left ankle & right elbow   Deviated nasal septum    Diverticulitis    Essential hypertension 01/05/2022   Glaucoma    H/O vitamin D deficiency    Hemophilia A ([place])    Hepatitis C virus    recent viral titer negative   Hernia    High blood pressure    History of pineal cyst 08/26/2018   OCD (obsessive compulsive disorder) 07/01/2015   Scoliosis    Tachycardia 01/05/2022   Telangiectasias    upper back and chest   UTI (urinary tract infection) 09/2015  :   Past Surgical History:  Procedure Laterality Date   ANKLE ARTHROSCOPY  1996   BLADDER SURGERY  2017   SCROTAL SURGERY     TRIGGER FINGER RELEASE Left    March 2018, bilat in 07/2021   URETHRA SURGERY     June 2018- dilation of urethra   :   Current Outpatient Medications:    ALPRAZolam (XANAX) 0.25 MG tablet, Take 0.25 mg by mouth at bedtime as needed for anxiety., Disp: , Rfl:    amLODipine (NORVASC) 5 MG tablet, Take 5 mg by mouth daily., Disp: , Rfl:    Antihemophilic Factor  rAHF-PAF (XYNTHA SOLOFUSE) 3000 UNITS KIT, Inject 3,000 Units into the vein as needed. (Patient taking differently: Inject 3,000 Units into the vein as needed (for bleeding).), Disp: 1 kit, Rfl: 1   Emicizumab-kxwh (HEMLIBRA [place]), Inject into the skin., Disp: , Rfl:    HYDROcodone-acetaminophen (NORCO/VICODIN) 5-325 MG tablet, Take 1-2 tablets by mouth every 6 (six) hours as needed for moderate pain or severe pain., Disp: 20 tablet, Rfl: 0   ivabradine (CORLANOR) 5 MG TABS tablet, Take 1 tablet 2 hours prior to CT, Disp: 1 tablet, Rfl: 0   lidocaine (LIDODERM) 5 %, lidocaine 5 % topical patch  APPLY 1 2 PATCH TO INTACT SKIN ONCE A DAY. REMOVE AFTER 12 HOURS AS NEEDED, Disp: , Rfl:    metoprolol tartrate (LOPRESSOR) 100 MG tablet, Take 1 tablet 2 hours prior to CT, Disp: 1 tablet, Rfl: 0   Naphazoline-Pheniramine 0.027-0.315 % SOLN, Apply 1 drop to eye daily as needed (for dry eyes)., Disp: , Rfl:    pantoprazole (PROTONIX) 40 MG tablet, Take 40 mg daily by mouth., Disp: , Rfl:    telmisartan (MICARDIS) 40 MG tablet, Take 40 mg by mouth daily., Disp: , Rfl: :     Allergies  Allergen Reactions   Aspirin Other (See Comments)    Bleeding  Doxycycline     Other reaction(s): rash   Hemophilia Support [Acid Blockers Support] Other (See Comments)    Hemophilia Factor VIII   Codeine Itching and Rash   Gluten Meal Diarrhea, Itching and Rash    General lethargy   Tetracycline Hcl Itching and Rash   Tetracyclines & Related Itching and Rash   Wheat Bran Diarrhea, Itching and Rash    General lethargy    FH:  SOCIAL HISTORY:  Review of Systems:   Physical Exam:  There were no vitals taken for this visit.  HEENT: *** Lungs: *** Cardiac: *** Abdomen: *** GU: ***  Vascular: *** Lymph nodes: *** Neurologic: *** Skin: *** Musculoskeletal: ***  LABS:  No results for input(s): "WBC", "HGB", "HCT", "PLT" in the last 72 hours.  No results for input(s): "NA", "K", "CL", "CO2",  "GLUCOSE", "BUN", "CREATININE", "CALCIUM" in the last 72 hours.    RADIOLOGY:  No results found.  Assessment and Plan:   1. Hemophilia A    2. History of hepatitis C infection. Negative hepatitis C RNA in November 2014 3. Chronic left ankle and right elbow arthropathy. 4. Celiac disease.   5. History of vitamin D deficiency.   6. History of hypertension.   7. 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   8. diagnosis of "scoliosis."   9. Diagnosis of a "pineal "cyst in September 2012, evaluated by neurology   10. 02/11/2016 sigmoidectomy and partial cystectomy for colovesical fistula 11. 02/28/2016 exploratory laparotomy, repair of anterior and posterior bladder defects and clot evacuation 12. Factor VIII inhibitor August 2017 status post Rituxan, negative inhibitor evaluation September 2018 13.  Urethral stricture    Ned Card, NP 01/20/2022, 4:24 PM

## 2022-01-21 ENCOUNTER — Other Ambulatory Visit: Payer: Self-pay | Admitting: Oncology

## 2022-01-21 ENCOUNTER — Inpatient Hospital Stay: Payer: BC Managed Care – PPO | Attending: Oncology

## 2022-01-21 ENCOUNTER — Encounter: Payer: Self-pay | Admitting: Nurse Practitioner

## 2022-01-21 ENCOUNTER — Inpatient Hospital Stay: Payer: BC Managed Care – PPO | Admitting: Nurse Practitioner

## 2022-01-21 VITALS — BP 149/84 | HR 100 | Temp 98.1°F | Resp 18 | Ht 70.0 in | Wt 186.4 lb

## 2022-01-21 DIAGNOSIS — D509 Iron deficiency anemia, unspecified: Secondary | ICD-10-CM | POA: Insufficient documentation

## 2022-01-21 DIAGNOSIS — D66 Hereditary factor VIII deficiency: Secondary | ICD-10-CM | POA: Insufficient documentation

## 2022-01-21 DIAGNOSIS — D508 Other iron deficiency anemias: Secondary | ICD-10-CM

## 2022-01-21 DIAGNOSIS — R112 Nausea with vomiting, unspecified: Secondary | ICD-10-CM | POA: Insufficient documentation

## 2022-01-21 DIAGNOSIS — N35919 Unspecified urethral stricture, male, unspecified site: Secondary | ICD-10-CM | POA: Insufficient documentation

## 2022-01-21 DIAGNOSIS — K900 Celiac disease: Secondary | ICD-10-CM | POA: Diagnosis not present

## 2022-01-21 DIAGNOSIS — I10 Essential (primary) hypertension: Secondary | ICD-10-CM | POA: Diagnosis not present

## 2022-01-21 DIAGNOSIS — Z8619 Personal history of other infectious and parasitic diseases: Secondary | ICD-10-CM | POA: Insufficient documentation

## 2022-01-21 DIAGNOSIS — K625 Hemorrhage of anus and rectum: Secondary | ICD-10-CM | POA: Diagnosis not present

## 2022-01-21 DIAGNOSIS — D649 Anemia, unspecified: Secondary | ICD-10-CM

## 2022-01-21 DIAGNOSIS — M419 Scoliosis, unspecified: Secondary | ICD-10-CM | POA: Diagnosis not present

## 2022-01-21 LAB — IRON AND TIBC
Iron: 48 ug/dL (ref 45–182)
Saturation Ratios: 10 % — ABNORMAL LOW (ref 17.9–39.5)
TIBC: 494 ug/dL — ABNORMAL HIGH (ref 250–450)
UIBC: 446 ug/dL

## 2022-01-21 LAB — CBC WITH DIFFERENTIAL (CANCER CENTER ONLY)
Abs Immature Granulocytes: 0.01 10*3/uL (ref 0.00–0.07)
Basophils Absolute: 0 10*3/uL (ref 0.0–0.1)
Basophils Relative: 1 %
Eosinophils Absolute: 0.1 10*3/uL (ref 0.0–0.5)
Eosinophils Relative: 3 %
HCT: 37.3 % — ABNORMAL LOW (ref 39.0–52.0)
Hemoglobin: 11.5 g/dL — ABNORMAL LOW (ref 13.0–17.0)
Immature Granulocytes: 0 %
Lymphocytes Relative: 28 %
Lymphs Abs: 1.2 10*3/uL (ref 0.7–4.0)
MCH: 25.3 pg — ABNORMAL LOW (ref 26.0–34.0)
MCHC: 30.8 g/dL (ref 30.0–36.0)
MCV: 82 fL (ref 80.0–100.0)
Monocytes Absolute: 0.5 10*3/uL (ref 0.1–1.0)
Monocytes Relative: 11 %
Neutro Abs: 2.4 10*3/uL (ref 1.7–7.7)
Neutrophils Relative %: 57 %
Platelet Count: 328 10*3/uL (ref 150–400)
RBC: 4.55 MIL/uL (ref 4.22–5.81)
RDW: 14.6 % (ref 11.5–15.5)
WBC Count: 4.1 10*3/uL (ref 4.0–10.5)
nRBC: 0 % (ref 0.0–0.2)

## 2022-01-21 LAB — FERRITIN: Ferritin: 8 ng/mL — ABNORMAL LOW (ref 24–336)

## 2022-01-21 LAB — SAVE SMEAR(SSMR), FOR PROVIDER SLIDE REVIEW

## 2022-01-26 ENCOUNTER — Other Ambulatory Visit (HOSPITAL_COMMUNITY): Payer: BC Managed Care – PPO

## 2022-01-27 ENCOUNTER — Inpatient Hospital Stay: Payer: BC Managed Care – PPO

## 2022-01-27 DIAGNOSIS — D509 Iron deficiency anemia, unspecified: Secondary | ICD-10-CM | POA: Diagnosis not present

## 2022-01-30 ENCOUNTER — Other Ambulatory Visit: Payer: Self-pay | Admitting: *Deleted

## 2022-01-30 ENCOUNTER — Inpatient Hospital Stay: Payer: BC Managed Care – PPO

## 2022-01-30 ENCOUNTER — Other Ambulatory Visit: Payer: Self-pay | Admitting: Nurse Practitioner

## 2022-01-30 VITALS — BP 126/87 | HR 95 | Temp 98.1°F | Resp 18 | Ht 70.0 in | Wt 185.0 lb

## 2022-01-30 DIAGNOSIS — D509 Iron deficiency anemia, unspecified: Secondary | ICD-10-CM | POA: Diagnosis not present

## 2022-01-30 DIAGNOSIS — D66 Hereditary factor VIII deficiency: Secondary | ICD-10-CM | POA: Diagnosis not present

## 2022-01-30 DIAGNOSIS — D508 Other iron deficiency anemias: Secondary | ICD-10-CM

## 2022-01-30 DIAGNOSIS — K900 Celiac disease: Secondary | ICD-10-CM | POA: Diagnosis not present

## 2022-01-30 DIAGNOSIS — R112 Nausea with vomiting, unspecified: Secondary | ICD-10-CM | POA: Diagnosis not present

## 2022-01-30 DIAGNOSIS — Z8619 Personal history of other infectious and parasitic diseases: Secondary | ICD-10-CM | POA: Diagnosis not present

## 2022-01-30 DIAGNOSIS — K625 Hemorrhage of anus and rectum: Secondary | ICD-10-CM | POA: Diagnosis not present

## 2022-01-30 DIAGNOSIS — M419 Scoliosis, unspecified: Secondary | ICD-10-CM | POA: Diagnosis not present

## 2022-01-30 DIAGNOSIS — N35919 Unspecified urethral stricture, male, unspecified site: Secondary | ICD-10-CM | POA: Diagnosis not present

## 2022-01-30 DIAGNOSIS — I10 Essential (primary) hypertension: Secondary | ICD-10-CM | POA: Diagnosis not present

## 2022-01-30 MED ORDER — SODIUM CHLORIDE 0.9 % IV SOLN
200.0000 mg | Freq: Once | INTRAVENOUS | Status: AC
  Administered 2022-01-30: 200 mg via INTRAVENOUS
  Filled 2022-01-30: qty 10

## 2022-01-30 MED ORDER — SODIUM CHLORIDE 0.9 % IV SOLN: INTRAVENOUS | Status: DC

## 2022-01-30 NOTE — Patient Instructions (Signed)

## 2022-01-30 NOTE — Progress Notes (Signed)
Per patient's request, provided him occult for stool sample which he stated will be returned on the day of his next infusion appointment 02/04/2022.

## 2022-02-04 ENCOUNTER — Inpatient Hospital Stay: Payer: BC Managed Care – PPO | Attending: Nurse Practitioner

## 2022-02-04 ENCOUNTER — Ambulatory Visit (HOSPITAL_BASED_OUTPATIENT_CLINIC_OR_DEPARTMENT_OTHER): Payer: BC Managed Care – PPO | Admitting: Family

## 2022-02-04 VITALS — BP 119/80 | HR 90 | Temp 98.2°F | Resp 17

## 2022-02-04 DIAGNOSIS — Z8619 Personal history of other infectious and parasitic diseases: Secondary | ICD-10-CM | POA: Diagnosis not present

## 2022-02-04 DIAGNOSIS — R000 Tachycardia, unspecified: Secondary | ICD-10-CM | POA: Diagnosis not present

## 2022-02-04 DIAGNOSIS — K900 Celiac disease: Secondary | ICD-10-CM | POA: Diagnosis not present

## 2022-02-04 DIAGNOSIS — R0609 Other forms of dyspnea: Secondary | ICD-10-CM | POA: Insufficient documentation

## 2022-02-04 DIAGNOSIS — D509 Iron deficiency anemia, unspecified: Secondary | ICD-10-CM | POA: Diagnosis not present

## 2022-02-04 DIAGNOSIS — D66 Hereditary factor VIII deficiency: Secondary | ICD-10-CM | POA: Insufficient documentation

## 2022-02-04 DIAGNOSIS — I10 Essential (primary) hypertension: Secondary | ICD-10-CM | POA: Diagnosis not present

## 2022-02-04 DIAGNOSIS — Z79899 Other long term (current) drug therapy: Secondary | ICD-10-CM | POA: Diagnosis not present

## 2022-02-04 DIAGNOSIS — N35919 Unspecified urethral stricture, male, unspecified site: Secondary | ICD-10-CM | POA: Insufficient documentation

## 2022-02-04 DIAGNOSIS — D508 Other iron deficiency anemias: Secondary | ICD-10-CM

## 2022-02-04 DIAGNOSIS — R5381 Other malaise: Secondary | ICD-10-CM | POA: Diagnosis not present

## 2022-02-04 MED ORDER — SODIUM CHLORIDE 0.9 % IV SOLN
200.0000 mg | Freq: Once | INTRAVENOUS | Status: AC
  Administered 2022-02-04: 200 mg via INTRAVENOUS
  Filled 2022-02-04: qty 200

## 2022-02-04 MED ORDER — SODIUM CHLORIDE 0.9 % IV SOLN: Freq: Once | INTRAVENOUS | Status: AC

## 2022-02-04 NOTE — Patient Instructions (Signed)

## 2022-02-09 ENCOUNTER — Other Ambulatory Visit: Payer: Self-pay | Admitting: Nurse Practitioner

## 2022-02-09 ENCOUNTER — Telehealth (HOSPITAL_COMMUNITY): Payer: Self-pay | Admitting: Emergency Medicine

## 2022-02-09 NOTE — Telephone Encounter (Signed)
Reaching out to patient to offer assistance regarding upcoming cardiac imaging study; pt verbalizes understanding of appt date/time, parking situation and where to check in, pre-test NPO status and medications ordered, and verified current allergies; name and call back number provided for further questions should they arise Rockwell Alexandria RN Navigator Cardiac Imaging [facility] [phone] office [phone] cell  100mg  metoprolol + 5mg  ivabradine Denies iv issues Arrival 330 Nervous- encouraged to take xanax if needed

## 2022-02-10 ENCOUNTER — Ambulatory Visit (HOSPITAL_COMMUNITY)
Admission: RE | Admit: 2022-02-10 | Discharge: 2022-02-10 | Disposition: A | Discharge status: Home / Self Care | Payer: BC Managed Care – PPO | Source: Ambulatory Visit | Attending: Cardiovascular Disease | Admitting: Cardiovascular Disease

## 2022-02-10 DIAGNOSIS — R079 Chest pain, unspecified: Secondary | ICD-10-CM | POA: Diagnosis not present

## 2022-02-10 DIAGNOSIS — R000 Tachycardia, unspecified: Secondary | ICD-10-CM | POA: Insufficient documentation

## 2022-02-10 DIAGNOSIS — I2510 Atherosclerotic heart disease of native coronary artery without angina pectoris: Secondary | ICD-10-CM | POA: Diagnosis not present

## 2022-02-10 MED ORDER — DILTIAZEM HCL 25 MG/5ML IV SOLN: 10.0000 mg | Freq: Once | INTRAVENOUS | Status: AC

## 2022-02-10 MED ORDER — DILTIAZEM HCL 25 MG/5ML IV SOLN
INTRAVENOUS | Status: AC
  Administered 2022-02-10: 10 mg via INTRAVENOUS
  Filled 2022-02-10: qty 5

## 2022-02-10 MED ORDER — METOPROLOL TARTRATE 5 MG/5ML IV SOLN: 5.0000 mg | INTRAVENOUS | Status: DC | PRN

## 2022-02-10 MED ORDER — IOHEXOL 350 MG/ML SOLN
95.0000 mL | Freq: Once | INTRAVENOUS | Status: AC | PRN
  Administered 2022-02-10: 95 mL via INTRAVENOUS

## 2022-02-10 MED ORDER — NITROGLYCERIN 0.4 MG SL SUBL
0.8000 mg | SUBLINGUAL_TABLET | Freq: Once | SUBLINGUAL | Status: AC
  Administered 2022-02-10: 0.8 mg via SUBLINGUAL

## 2022-02-10 MED ORDER — NITROGLYCERIN 0.4 MG SL SUBL
SUBLINGUAL_TABLET | SUBLINGUAL | Status: AC
  Filled 2022-02-10: qty 2

## 2022-02-10 MED ORDER — METOPROLOL TARTRATE 5 MG/5ML IV SOLN
INTRAVENOUS | Status: AC
  Administered 2022-02-10: 5 mg via INTRAVENOUS
  Filled 2022-02-10: qty 10

## 2022-02-11 ENCOUNTER — Inpatient Hospital Stay: Payer: BC Managed Care – PPO

## 2022-02-11 ENCOUNTER — Encounter: Payer: Self-pay | Admitting: *Deleted

## 2022-02-11 ENCOUNTER — Other Ambulatory Visit: Payer: Self-pay | Admitting: Nurse Practitioner

## 2022-02-11 ENCOUNTER — Inpatient Hospital Stay (HOSPITAL_BASED_OUTPATIENT_CLINIC_OR_DEPARTMENT_OTHER): Payer: BC Managed Care – PPO | Admitting: Oncology

## 2022-02-11 VITALS — BP 129/89 | HR 102 | Temp 98.3°F | Resp 18

## 2022-02-11 VITALS — BP 148/88 | HR 100 | Temp 98.8°F | Resp 18 | Ht 70.0 in | Wt 187.6 lb

## 2022-02-11 DIAGNOSIS — D508 Other iron deficiency anemias: Secondary | ICD-10-CM | POA: Diagnosis not present

## 2022-02-11 DIAGNOSIS — R000 Tachycardia, unspecified: Secondary | ICD-10-CM | POA: Diagnosis not present

## 2022-02-11 DIAGNOSIS — R0609 Other forms of dyspnea: Secondary | ICD-10-CM | POA: Diagnosis not present

## 2022-02-11 DIAGNOSIS — K900 Celiac disease: Secondary | ICD-10-CM | POA: Diagnosis not present

## 2022-02-11 DIAGNOSIS — I10 Essential (primary) hypertension: Secondary | ICD-10-CM | POA: Diagnosis not present

## 2022-02-11 DIAGNOSIS — Z8619 Personal history of other infectious and parasitic diseases: Secondary | ICD-10-CM | POA: Diagnosis not present

## 2022-02-11 DIAGNOSIS — D66 Hereditary factor VIII deficiency: Secondary | ICD-10-CM | POA: Diagnosis not present

## 2022-02-11 DIAGNOSIS — R5381 Other malaise: Secondary | ICD-10-CM | POA: Diagnosis not present

## 2022-02-11 DIAGNOSIS — D509 Iron deficiency anemia, unspecified: Secondary | ICD-10-CM | POA: Diagnosis not present

## 2022-02-11 DIAGNOSIS — N35919 Unspecified urethral stricture, male, unspecified site: Secondary | ICD-10-CM | POA: Diagnosis not present

## 2022-02-11 DIAGNOSIS — Z79899 Other long term (current) drug therapy: Secondary | ICD-10-CM | POA: Diagnosis not present

## 2022-02-11 LAB — CBC WITH DIFFERENTIAL (CANCER CENTER ONLY)
Abs Immature Granulocytes: 0.02 10*3/uL (ref 0.00–0.07)
Basophils Absolute: 0 10*3/uL (ref 0.0–0.1)
Basophils Relative: 1 %
Eosinophils Absolute: 0.2 10*3/uL (ref 0.0–0.5)
Eosinophils Relative: 3 %
HCT: 38.6 % — ABNORMAL LOW (ref 39.0–52.0)
Hemoglobin: 12.4 g/dL — ABNORMAL LOW (ref 13.0–17.0)
Immature Granulocytes: 0 %
Lymphocytes Relative: 22 %
Lymphs Abs: 1.2 10*3/uL (ref 0.7–4.0)
MCH: 25.9 pg — ABNORMAL LOW (ref 26.0–34.0)
MCHC: 32.1 g/dL (ref 30.0–36.0)
MCV: 80.6 fL (ref 80.0–100.0)
Monocytes Absolute: 0.6 10*3/uL (ref 0.1–1.0)
Monocytes Relative: 11 %
Neutro Abs: 3.5 10*3/uL (ref 1.7–7.7)
Neutrophils Relative %: 63 %
Platelet Count: 291 10*3/uL (ref 150–400)
RBC: 4.79 MIL/uL (ref 4.22–5.81)
RDW: 16.8 % — ABNORMAL HIGH (ref 11.5–15.5)
WBC Count: 5.5 10*3/uL (ref 4.0–10.5)
nRBC: 0 % (ref 0.0–0.2)

## 2022-02-11 LAB — FERRITIN: Ferritin: 108 ng/mL (ref 24–336)

## 2022-02-11 MED ORDER — SODIUM CHLORIDE 0.9 % IV SOLN: Freq: Once | INTRAVENOUS | Status: AC

## 2022-02-11 MED ORDER — SODIUM CHLORIDE 0.9 % IV SOLN
200.0000 mg | Freq: Once | INTRAVENOUS | Status: AC
  Administered 2022-02-11: 200 mg via INTRAVENOUS
  Filled 2022-02-11: qty 10

## 2022-02-11 NOTE — Progress Notes (Signed)
[facility] OFFICE PROGRESS NOTE   Diagnosis: Hemophilia, iron deficiency anemia  INTERVAL HISTORY:   Darrell Graham returns as scheduled.  He completed treatment with Venofer on 01/30/2022 and 02/04/2022.  He reports tolerating the treatment well.  No sign of allergic reaction.  He had burning at the IV site in his hand with 1 treatment. He complains of malaise, tachycardia, and exertional dyspnea.  He is being evaluated by cardiology for a reduced ejection fraction.  He underwent a cardiac CT yesterday. No bleeding.  No recent hemophilia bleeding.  Objective:  Vital signs in last 24 hours:  Blood pressure (!) 148/88, pulse 100, temperature 98.8 F (37.1 C), resp. rate 18, height 5\' 10"  (1.778 m), weight 187 lb 9.6 oz (85.1 kg), SpO2 98 %.    Lymphatics: No axillary nodes Resp: Lungs clear bilaterally Cardio: Regular rate and rhythm GI: No hepatosplenomegaly, no mass, mild tenderness in the bilateral mid to lower abdomen Vascular: No leg edema   Lab Results:  Lab Results  Component Value Date   WBC 5.5 02/11/2022   HGB 12.4 (L) 02/11/2022   HCT 38.6 (L) 02/11/2022   MCV 80.6 02/11/2022   PLT 291 02/11/2022   NEUTROABS 3.5 02/11/2022    CMP  Lab Results  Component Value Date   NA 142 01/07/2022   K 4.2 01/07/2022   CL 104 01/07/2022   CO2 22 01/07/2022   GLUCOSE 115 (H) 01/07/2022   BUN 11 01/07/2022   CREATININE 0.82 01/07/2022   CALCIUM 9.6 01/07/2022   PROT 6.7 09/23/2015   ALBUMIN 3.1 (L) 09/23/2015   AST 16 09/23/2015   ALT 11 (L) 09/23/2015   ALKPHOS 53 09/23/2015   BILITOT 0.6 09/23/2015   GFRNONAA >60 09/23/2015   GFRAA >60 09/23/2015      Imaging:  CT CORONARY MORPH W/CTA COR W/SCORE W/CA W/CM &/OR WO/CM  Addendum Date: 02/11/2022   ADDENDUM REPORT: 02/11/2022 09:12 EXAM: OVER-READ INTERPRETATION  CT CHEST The following report is an over-read performed by radiologist Dr. 04/14/2022 [facility] on 02/11/2022. This  over-read does not include interpretation of cardiac or coronary anatomy or pathology. The coronary CTA interpretation by the cardiologist is attached. COMPARISON:  CT angio chest 03/01/2016 FINDINGS: Aorta normal caliber. No pericardial effusion. Visualized pulmonary arteries patent. Visualized upper abdomen normal appearance. Esophagus normal appearance. No adenopathy. Lungs clear. No osseous abnormalities. IMPRESSION: No significant extracardiac findings. Electronically Signed   By: 03/03/2016 M.D.   On: 02/11/2022 09:12   Result Date: 02/11/2022 CLINICAL DATA:  33M with chest pain EXAM: Cardiac/Coronary CTA TECHNIQUE: The patient was scanned on a 04/14/2022. FINDINGS: A 100 kV prospective scan was triggered in the descending thoracic aorta at 111 HU's. Axial non-contrast 3 mm slices were carried out through the heart. The data set was analyzed on a dedicated work station and scored using the Agatson method. Gantry rotation speed was 250 msecs and collimation was .6 mm. 0.8 mg of sl NTG was given. The 3D data set was reconstructed in 5% intervals of the 35-75 % of the R-R cycle. Phases were analyzed on a dedicated work station using MPR, MIP and VRT modes. The patient received 100 cc of contrast. Coronary Arteries:  Normal coronary origin.  Right dominance. RCA is a large dominant artery that gives rise to PDA and PLA. Noncalcified plaque in proximal RCA causes mild (25-49%) stenosis Left main is a large artery that gives rise to LAD and LCX arteries. LAD is  a large vessel. Mixed plaque in proximal LAD causes severe (70-99%) stenosis. LCX is a non-dominant artery that gives rise to one large OM1 branch. Noncalcified plaque in OM1 causes 25-49% stenosis Other findings: Left Ventricle: Normal size. PFO Left Atrium: Normal size Pulmonary Veins: Normal configuration Right Ventricle: Normal size Right Atrium: Normal size Cardiac valves: No calcifications Thoracic aorta: Normal size Pulmonary Arteries:  Normal size Systemic Veins: Normal drainage Pericardium: Normal thickness IMPRESSION: 1. Obstructive CAD, with mixed plaque in proximal LAD causing severe (70-99%) stenosis. Cardiac catheterization recommended 2. Coronary calcium score of 8. This was 55th percentile for age and sex matched control. 3.  Normal coronary origin with right dominance. 4. Noncalcified plaque causes mild (25-49%) stenosis in proximal RCA and OM1 5.  PFO CAD-RADS 4 Severe stenosis. (70-99% or > 50% left main). Cardiac catheterization is recommended. Consider symptom-guided anti-ischemic pharmacotherapy as well as risk factor modification per guideline directed care. Electronically Signed: By: Epifanio Lesches M.D. On: 02/10/2022 18:28    Medications: I have reviewed the patient's current medications.   Assessment/Plan: Hemophilia A    2. History of hepatitis C infection. Negative hepatitis C RNA in November 2014 3. Chronic left ankle and right elbow arthropathy. 4. Celiac disease.   5. History of vitamin D deficiency.   6. History of hypertension.   7. 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   8. diagnosis of "scoliosis."   9. Diagnosis of a "pineal "cyst in September 2012, evaluated by neurology   10. 02/11/2016 sigmoidectomy and partial cystectomy for colovesical fistula 11. 02/28/2016 exploratory laparotomy, repair of anterior and posterior bladder defects and clot evacuation 12. Factor VIII inhibitor August 2017 status post Rituxan, negative inhibitor evaluation September 2018 13.  Urethral stricture 14.  Hospitalization with GI bleed 11/02/2021 through 11/12/2021 15.  Iron deficiency anemia Venofer weekly for 3 doses beginning 01/30/2022    Disposition: Darrell Graham has hemophilia A.  He was recently hospitalized with GI bleeding.  He had iron deficiency anemia when we saw him 01/21/2022.  He will complete a final dose of IV iron today.  He will return for an office visit and CBC in approximately 2  months.  He will continue follow-up with cardiology for evaluation of the exertional dyspnea and tachycardia.  Thornton Papas, MD  02/11/2022  11:42 AM

## 2022-02-11 NOTE — Progress Notes (Signed)
Patient seen by Dr. Sherrill today ? ?Vitals are within treatment parameters. ? ?Labs reviewed by Dr. Sherrill and are within treatment parameters. ? ?Per physician team, patient is ready for treatment and there are NO modifications to the treatment plan.  ?

## 2022-02-11 NOTE — Patient Instructions (Signed)

## 2022-02-13 ENCOUNTER — Encounter (HOSPITAL_BASED_OUTPATIENT_CLINIC_OR_DEPARTMENT_OTHER): Payer: Self-pay | Admitting: Family

## 2022-02-13 ENCOUNTER — Ambulatory Visit (INDEPENDENT_AMBULATORY_CARE_PROVIDER_SITE_OTHER): Payer: BC Managed Care – PPO | Admitting: Family

## 2022-02-13 VITALS — BP 138/82 | HR 92 | Ht 70.0 in | Wt 187.1 lb

## 2022-02-13 DIAGNOSIS — I255 Ischemic cardiomyopathy: Secondary | ICD-10-CM | POA: Diagnosis not present

## 2022-02-13 DIAGNOSIS — D66 Hereditary factor VIII deficiency: Secondary | ICD-10-CM

## 2022-02-13 DIAGNOSIS — I5020 Unspecified systolic (congestive) heart failure: Secondary | ICD-10-CM

## 2022-02-13 DIAGNOSIS — E785 Hyperlipidemia, unspecified: Secondary | ICD-10-CM

## 2022-02-13 DIAGNOSIS — I10 Essential (primary) hypertension: Secondary | ICD-10-CM

## 2022-02-13 DIAGNOSIS — I25118 Atherosclerotic heart disease of native coronary artery with other forms of angina pectoris: Secondary | ICD-10-CM

## 2022-02-13 MED ORDER — METOPROLOL SUCCINATE ER 25 MG PO TB24: 25.0000 mg | ORAL_TABLET | Freq: Every day | ORAL | 5 refills | Status: AC

## 2022-02-13 MED ORDER — ROSUVASTATIN CALCIUM 10 MG PO TABS: 10.0000 mg | ORAL_TABLET | Freq: Every day | ORAL | 5 refills | Status: AC

## 2022-02-13 NOTE — Patient Instructions (Addendum)
Medication Instructions:  Your physician has recommended you make the following change in your medication:   START Metoprolol Succinate one 25mg  tablet daily  START Rosuvastatin one 10mg  tablet daily  *If you need a refill on your cardiac medications before your next appointment, please call your pharmacy*  Lab Work: None ordered today.   Testing/Procedures: Your EKG today shows normal sinus rhythm which is a good.   Follow-Up: At [facility], you and your health needs are our priority.  As part of our continuing mission to provide you with exceptional heart care, we have created designated Provider Care Teams.  These Care Teams include your primary Cardiologist (physician) and Advanced Practice Providers (APPs -  Physician Assistants and Nurse Practitioners) who all work together to provide you with the care you need, when you need it.  We recommend signing up for the patient portal called "MyChart".  Sign up information is provided on this After Visit Summary.  MyChart is used to connect with patients for Virtual Visits (Telemedicine).  Patients are able to view lab/test results, encounter notes, upcoming appointments, etc.  Non-urgent messages can be sent to your provider as well.   To learn more about what you can do with MyChart, go to .    Your next appointment:   [facility], NP will reach out to [email or web address] and Dr. Alver Sorrow about scheduling your cardiac catheterization.    Other Instructions  For coronary artery disease often called "heart disease" we aim for optimal guideline directed medical therapy. We use the "A, B, C"s to help keep [facility] Salvia on track!  A = Aspirin 81mg  daily (none due to your hemophilia) B = Beta blocker which helps to relax the heart. This is your Metoprolol. C = Cholesterol control. You take Rosuvastatin to help control your cholesterol.   Heart Healthy Diet Recommendations: A low-salt diet is recommended. Meats should be  grilled, baked, or boiled. Avoid fried foods. Focus on lean protein sources like fish or chicken with vegetables and fruits. The American Heart Association is a J[place]ne Loa!  American Heart Association Diet and Lifeystyle Recommendations   Exercise recommendations: The American Heart Association recommends 150 minutes of moderate intensity exercise weekly. Try 30 minutes of moderate intensity exercise 4-5 times per week. This could include walking, jogging, or swimming.

## 2022-02-13 NOTE — Progress Notes (Signed)
Office Visit    Patient Name: Darrell Graham. Date of Encounter: 02/13/2022  PCP:  London Pepper, MD   [place]  Cardiologist:  Skeet Latch, MD  Advanced Practice Provider:  No care team member to display Electrophysiologist:  None     Chief Complaint    [name]. is a 56 y.o. male presents today for follow up after cardiac CTA.    Past Medical History    Past Medical History:  Diagnosis Date   Allergy    Asthma    Celiac disease    Chest pain of uncertain etiology 11/01/6604   Chronic arthropathy    left ankle & right elbow   Deviated nasal septum    Diverticulitis    Essential hypertension 01/05/2022   Glaucoma    H/O vitamin D deficiency    Hemophilia A ([place])    Hepatitis C virus    recent viral titer negative   Hernia    High blood pressure    History of pineal cyst 08/26/2018   OCD (obsessive compulsive disorder) 07/01/2015   Scoliosis    Tachycardia 01/05/2022   Telangiectasias    upper back and chest   UTI (urinary tract infection) 09/2015   Past Surgical History:  Procedure Laterality Date   ANKLE ARTHROSCOPY  1996   BLADDER SURGERY  2017   SCROTAL SURGERY     TRIGGER FINGER RELEASE Left    March 2018, bilat in 07/2021   URETHRA SURGERY     June 2018- dilation of urethra     Allergies  Allergies  Allergen Reactions   Aspirin Other (See Comments)    Bleeding    Doxycycline     Other reaction(s): rash   Hemophilia Support [Acid Blockers Support] Other (See Comments)    Hemophilia Factor VIII   Codeine Itching and Rash   Gluten Meal Diarrhea, Itching and Rash    General lethargy   Tetracycline Hcl Itching and Rash   Tetracyclines & Related Itching and Rash   Wheat Bran Diarrhea, Itching and Rash    General lethargy    History of Present Illness    Darrell Graham. is a 56 y.o. male with a hx of CAD, HFrEF/ICM/ HTN, asthma, celiac disease, hemophilia A, hepatitis C, telangiectasia, OCD last  seen 01/05/22 by Dr. Oval Linsey.  Seen in consult 01/05/22 due to chest pain, dyspnea. Given hemophilia, PCI viewed as last resort. Echo 01/2022 EF 40-45%, LV global hypokinesis, LV mildly dilated, no significant valvular abnormalities. [place] CTA coronary calcium score of 8 placing him in 55th percentile for age/sex. However, there was mixed plaque in prox LAD causing severe (70-99%) stenosis as well as noncalcified plaque mild (25-49%) prox RCA and OM1. Also noted PFO  Presents today for follow up independently. 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 He enjoys fishing at [facility] in his spare time. Reviewed cardiac CTA. Understandably hesitant about procedures given hemophilia A and wonders if cath needs to be performed at [facility] as his hemophilia specialists are theree (Dr. Lanell Persons, Dr, Sherral Hammers). Notes since last seen his exertional dyspnea as well as chest pain have worsened. Previously had PRN nitroglycerin but caused extreme headache. Inquires whether he would qualify for disability, says he has been turned down 3 times, discussed that coronary disease not likely to require.  EKGs/Labs/Other Studies Reviewed:   The following studies were reviewed today:  Echo 01/2022  1. Left ventricular ejection  fraction, by estimation, is 40 to 45%. The  left ventricle has mildly decreased function. The left ventricle  demonstrates global hypokinesis. The left ventricular internal cavity size  was mildly dilated. Left ventricular  diastolic parameters were normal.   2. Right ventricular systolic function is normal. The right ventricular  size is normal.   3. The mitral valve is normal in structure. No evidence of mitral valve  regurgitation.   4. The aortic valve is normal in structure. Aortic valve regurgitation is  not visualized. No aortic stenosis is present.   5. The inferior vena cava is normal in size with greater than 50%  respiratory variability, suggesting right atrial  pressure of 3 mmHg.   Cardiac CTA 01/2022   Coronary Arteries:  Normal coronary origin.  Right dominance.   RCA is a large dominant artery that gives rise to PDA and PLA. Noncalcified plaque in proximal RCA causes mild (25-49%) stenosis   Left main is a large artery that gives rise to LAD and LCX arteries.   LAD is a large vessel. Mixed plaque in proximal LAD causes severe (70-99%) stenosis.   LCX is a non-dominant artery that gives rise to one large OM1 branch. Noncalcified plaque in OM1 causes 25-49% stenosis   Other findings:   Left Ventricle: Normal size. PFO   Left Atrium: Normal size   Pulmonary Veins: Normal configuration   Right Ventricle: Normal size   Right Atrium: Normal size   Cardiac valves: No calcifications   Thoracic aorta: Normal size   Pulmonary Arteries: Normal size   Systemic Veins: Normal drainage   Pericardium: Normal thickness   IMPRESSION: 1. Obstructive CAD, with mixed plaque in proximal LAD causing severe (70-99%) stenosis. Cardiac catheterization recommended   2. Coronary calcium score of 8. This was 55th percentile for age and sex matched control.   3.  Normal coronary origin with right dominance.   4. Noncalcified plaque causes mild (25-49%) stenosis in proximal RCA and OM1   5.  PFO   CAD-RADS 4 Severe stenosis. (70-99% or > 50% left main). Cardiac catheterization is recommended. Consider symptom-guided anti-ischemic pharmacotherapy as well as risk factor modification per guideline directed care.    EKG:  EKG is ordered today.  The ekg ordered today demonstrates NSR 92 bpm.   Recent Labs: 01/07/2022: BUN 11; Creatinine, Ser 0.82; Potassium 4.2; Sodium 142; TSH 1.070 02/11/2022: Hemoglobin 12.4; Platelet Count 291  Recent Lipid Panel No results found for: "CHOL", "TRIG", "HDL", "CHOLHDL", "VLDL", "LDLCALC", "LDLDIRECT"  Home Medications   Current Meds  Medication Sig   ALPRAZolam (XANAX) 0.25 MG tablet Take 0.25 mg by  mouth at bedtime as needed for anxiety.   amLODipine (NORVASC) 5 MG tablet Take 5 mg by mouth daily.   Antihemophilic Factor rAHF-PAF (XYNTHA SOLOFUSE) 3000 UNITS KIT Inject 3,000 Units into the vein as needed. (Patient taking differently: Inject 3,000 Units into the vein as needed (for bleeding).)   Emicizumab-kxwh (HEMLIBRA [place]) Inject into the skin.   HYDROcodone-acetaminophen (NORCO/VICODIN) 5-325 MG tablet Take 1-2 tablets by mouth every 6 (six) hours as needed for moderate pain or severe pain.   lidocaine (LIDODERM) 5 % lidocaine 5 % topical patch  APPLY 1 2 PATCH TO INTACT SKIN ONCE A DAY. REMOVE AFTER 12 HOURS AS NEEDED   metoprolol succinate (TOPROL XL) 25 MG 24 hr tablet Take 1 tablet (25 mg total) by mouth daily.   mupirocin ointment (BACTROBAN) 2 % Apply 1 Application topically 3 (three) times daily.  Naphazoline-Pheniramine 0.027-0.315 % SOLN Apply 1 drop to eye daily as needed (for dry eyes).   pantoprazole (PROTONIX) 40 MG tablet Take 40 mg daily by mouth.   rosuvastatin (CRESTOR) 10 MG tablet Take 1 tablet (10 mg total) by mouth daily.   telmisartan (MICARDIS) 40 MG tablet Take 40 mg by mouth daily.     Review of Systems     All other systems reviewed and are otherwise negative except as noted above.  Physical Exam    VS:  BP 138/82 (BP Location: Left Arm, Patient Position: Sitting, Cuff Size: Normal)   Pulse 92   Ht _0  (1.778 m)   Wt 187 lb 1.6 oz (84.9 kg)   BMI 26.85 kg/m  , BMI Body mass index is 26.85 kg/m.  Wt Readings from Last 3 Encounters:  02/13/22 187 lb 1.6 oz (84.9 kg)  02/11/22 187 lb 9.6 oz (85.1 kg)  01/30/22 185 lb (83.9 kg)     GEN: Well nourished, well developed, in no acute distress. HEENT: normal. Neck: Supple, no JVD, carotid bruits, or masses. Cardiac: RRR, no murmurs, rubs, or gallops. No clubbing, cyanosis, edema.  Radials/PT 2+ and equal bilaterally.  Respiratory:  Respirations regular and unlabored, clear to auscultation  bilaterally. GI: Soft, nontender, nondistended. MS: No deformity or atrophy. Skin: Warm and dry, no rash. Neuro:  Strength and sensation are intact. Psych: Normal affect.  Assessment & Plan    CAD - Cardiac CTA 01/2022 mixed plaque in prox LAD with 70-99% stenosis, noncalcified plaque with mild (25-49%) stenosis in prox RCA and OM1. Symptomatic with exertional chest discomfort, dyspnea with minimal exertion which is worsening since consult 1 month ago. Add Toprol 73m QD for antianginal benefit. Prior headache with nitroglycerin so will not prescribe. Continue Amlodipine. Add Crestor 112mD. No aspirin due to hemophilia A. Will reach out to Dr. RaOval Linseys well as his hematology regarding possible cardiac cath, PCI, antiplatelet agent given hemophilia. He is understandably concerned and wonders whether should be at [facility] his mhemophilia specialists are there.   PFO - Inadvertent finding by CTA 01/2022. No further intervention at this time.   HLD, LDL goal <70 - No prior statin. 07/2021 LDL 122. Add Crestor 1041mD. Coordinate repeat lipids at follow up.   HTN - BP well controlled. Continue current antihypertensive regimen.    HFrEF - Echo 01/2022 EF40-45%. Likely ICM as cardiac CTA 01/2022 with 70-99% stenosis in LAD. Euvolemic and well compensated on exam. GDMT includes Telmisartan. Add Toprol. Address further up titration of HF pending treatment of CAD.   Hemophilia A - Follows with hematology. Will request their input regarding possible PCI.     Disposition: Follow up pending plans for PCI  Signed, CaiLoel DubonnetP 02/13/2022, 8:17 PM [place]

## 2022-02-16 ENCOUNTER — Telehealth: Payer: Self-pay | Admitting: Cardiovascular Disease

## 2022-02-16 NOTE — Telephone Encounter (Signed)
Called patient to discuss his coronary CT-A results.  He has a 70-99% proximal LAD lesion.  He continues to have exertional dyspnea.  Given his multiple comorbidities, I have also spoken with his gastroenterologist, Dr. Shawn Stall at [facility].  I have spoken with his hematologist at [facility], Dr. Truett Perna, and his hematology team at [facility].  We are all in agreement that given his recent GI bleeding and hemophilia A, his best option is to undergo cardiac catheterization at [facility], where they have onsite hematology services and hemophilia team.  Per Dr. Shawn Stall, he underwent successful embolization and is able to receive dual antiplatelet therapy from a GI standpoint, should it be needed.  He understands that he may need PCI or possibly CABG should he not be deemed an acceptable candidate for DAPT.  He is working on getting someone to care for his mother so that he can go to [facility] and receive care.  Quin Mathenia C. Duke Salvia, MD, [facility] 02/16/2022 5:34 PM

## 2022-02-18 DIAGNOSIS — F419 Anxiety disorder, unspecified: Secondary | ICD-10-CM | POA: Diagnosis not present

## 2022-02-18 DIAGNOSIS — K219 Gastro-esophageal reflux disease without esophagitis: Secondary | ICD-10-CM | POA: Diagnosis not present

## 2022-02-18 DIAGNOSIS — N400 Enlarged prostate without lower urinary tract symptoms: Secondary | ICD-10-CM | POA: Diagnosis not present

## 2022-02-18 DIAGNOSIS — I25119 Atherosclerotic heart disease of native coronary artery with unspecified angina pectoris: Secondary | ICD-10-CM | POA: Diagnosis not present

## 2022-02-18 DIAGNOSIS — I5022 Chronic systolic (congestive) heart failure: Secondary | ICD-10-CM | POA: Diagnosis not present

## 2022-02-18 DIAGNOSIS — Z79899 Other long term (current) drug therapy: Secondary | ICD-10-CM | POA: Diagnosis not present

## 2022-02-18 DIAGNOSIS — I2510 Atherosclerotic heart disease of native coronary artery without angina pectoris: Secondary | ICD-10-CM | POA: Diagnosis not present

## 2022-02-18 DIAGNOSIS — R079 Chest pain, unspecified: Secondary | ICD-10-CM | POA: Diagnosis not present

## 2022-02-18 DIAGNOSIS — Z7982 Long term (current) use of aspirin: Secondary | ICD-10-CM | POA: Diagnosis not present

## 2022-02-18 DIAGNOSIS — G43909 Migraine, unspecified, not intractable, without status migrainosus: Secondary | ICD-10-CM | POA: Diagnosis not present

## 2022-02-18 DIAGNOSIS — I25118 Atherosclerotic heart disease of native coronary artery with other forms of angina pectoris: Secondary | ICD-10-CM | POA: Diagnosis not present

## 2022-02-18 DIAGNOSIS — Z951 Presence of aortocoronary bypass graft: Secondary | ICD-10-CM | POA: Diagnosis not present

## 2022-02-18 DIAGNOSIS — D66 Hereditary factor VIII deficiency: Secondary | ICD-10-CM | POA: Diagnosis not present

## 2022-02-18 DIAGNOSIS — I110 Hypertensive heart disease with heart failure: Secondary | ICD-10-CM | POA: Diagnosis not present

## 2022-03-11 DIAGNOSIS — E291 Testicular hypofunction: Secondary | ICD-10-CM | POA: Diagnosis not present

## 2022-03-11 DIAGNOSIS — R7989 Other specified abnormal findings of blood chemistry: Secondary | ICD-10-CM | POA: Diagnosis not present

## 2022-03-12 DIAGNOSIS — I25118 Atherosclerotic heart disease of native coronary artery with other forms of angina pectoris: Secondary | ICD-10-CM | POA: Diagnosis not present

## 2022-03-12 DIAGNOSIS — D66 Hereditary factor VIII deficiency: Secondary | ICD-10-CM | POA: Diagnosis not present

## 2022-03-12 DIAGNOSIS — I255 Ischemic cardiomyopathy: Secondary | ICD-10-CM | POA: Diagnosis not present

## 2022-03-12 DIAGNOSIS — I10 Essential (primary) hypertension: Secondary | ICD-10-CM | POA: Diagnosis not present

## 2022-03-17 DIAGNOSIS — D66 Hereditary factor VIII deficiency: Secondary | ICD-10-CM | POA: Diagnosis not present

## 2022-03-23 DIAGNOSIS — I255 Ischemic cardiomyopathy: Secondary | ICD-10-CM | POA: Diagnosis not present

## 2022-03-24 DIAGNOSIS — I25118 Atherosclerotic heart disease of native coronary artery with other forms of angina pectoris: Secondary | ICD-10-CM | POA: Diagnosis not present

## 2022-03-24 DIAGNOSIS — I255 Ischemic cardiomyopathy: Secondary | ICD-10-CM | POA: Diagnosis not present

## 2022-03-24 DIAGNOSIS — R9431 Abnormal electrocardiogram [ECG] [EKG]: Secondary | ICD-10-CM | POA: Diagnosis not present

## 2022-03-24 DIAGNOSIS — K5730 Diverticulosis of large intestine without perforation or abscess without bleeding: Secondary | ICD-10-CM | POA: Diagnosis not present

## 2022-03-24 DIAGNOSIS — Z9889 Other specified postprocedural states: Secondary | ICD-10-CM | POA: Diagnosis not present

## 2022-03-24 DIAGNOSIS — Z20822 Contact with and (suspected) exposure to covid-19: Secondary | ICD-10-CM | POA: Diagnosis not present

## 2022-03-24 DIAGNOSIS — I2510 Atherosclerotic heart disease of native coronary artery without angina pectoris: Secondary | ICD-10-CM | POA: Diagnosis not present

## 2022-03-24 DIAGNOSIS — K900 Celiac disease: Secondary | ICD-10-CM | POA: Diagnosis not present

## 2022-03-24 DIAGNOSIS — D66 Hereditary factor VIII deficiency: Secondary | ICD-10-CM | POA: Diagnosis not present

## 2022-03-25 DIAGNOSIS — D66 Hereditary factor VIII deficiency: Secondary | ICD-10-CM | POA: Diagnosis not present

## 2022-03-25 DIAGNOSIS — R9431 Abnormal electrocardiogram [ECG] [EKG]: Secondary | ICD-10-CM | POA: Diagnosis not present

## 2022-03-25 DIAGNOSIS — I25118 Atherosclerotic heart disease of native coronary artery with other forms of angina pectoris: Secondary | ICD-10-CM | POA: Diagnosis not present

## 2022-03-25 DIAGNOSIS — K5730 Diverticulosis of large intestine without perforation or abscess without bleeding: Secondary | ICD-10-CM | POA: Diagnosis not present

## 2022-03-25 DIAGNOSIS — I255 Ischemic cardiomyopathy: Secondary | ICD-10-CM | POA: Diagnosis not present

## 2022-03-25 DIAGNOSIS — Z9861 Coronary angioplasty status: Secondary | ICD-10-CM | POA: Diagnosis not present

## 2022-03-25 DIAGNOSIS — Z20822 Contact with and (suspected) exposure to covid-19: Secondary | ICD-10-CM | POA: Diagnosis not present

## 2022-03-25 DIAGNOSIS — K900 Celiac disease: Secondary | ICD-10-CM | POA: Diagnosis not present

## 2022-03-25 DIAGNOSIS — I2510 Atherosclerotic heart disease of native coronary artery without angina pectoris: Secondary | ICD-10-CM | POA: Diagnosis not present

## 2022-03-25 DIAGNOSIS — Z9889 Other specified postprocedural states: Secondary | ICD-10-CM | POA: Diagnosis not present

## 2022-03-26 DIAGNOSIS — I255 Ischemic cardiomyopathy: Secondary | ICD-10-CM | POA: Diagnosis not present

## 2022-03-26 DIAGNOSIS — I2510 Atherosclerotic heart disease of native coronary artery without angina pectoris: Secondary | ICD-10-CM | POA: Diagnosis not present

## 2022-03-26 DIAGNOSIS — Z01810 Encounter for preprocedural cardiovascular examination: Secondary | ICD-10-CM | POA: Diagnosis not present

## 2022-03-26 DIAGNOSIS — R9431 Abnormal electrocardiogram [ECG] [EKG]: Secondary | ICD-10-CM | POA: Diagnosis not present

## 2022-03-26 DIAGNOSIS — D66 Hereditary factor VIII deficiency: Secondary | ICD-10-CM | POA: Diagnosis not present

## 2022-03-26 DIAGNOSIS — K900 Celiac disease: Secondary | ICD-10-CM | POA: Diagnosis not present

## 2022-03-26 DIAGNOSIS — K5730 Diverticulosis of large intestine without perforation or abscess without bleeding: Secondary | ICD-10-CM | POA: Diagnosis not present

## 2022-03-26 DIAGNOSIS — I25118 Atherosclerotic heart disease of native coronary artery with other forms of angina pectoris: Secondary | ICD-10-CM | POA: Diagnosis not present

## 2022-03-26 DIAGNOSIS — Z9889 Other specified postprocedural states: Secondary | ICD-10-CM | POA: Diagnosis not present

## 2022-03-26 DIAGNOSIS — Z20822 Contact with and (suspected) exposure to covid-19: Secondary | ICD-10-CM | POA: Diagnosis not present

## 2022-03-31 ENCOUNTER — Inpatient Hospital Stay: Payer: BC Managed Care – PPO | Attending: Nurse Practitioner

## 2022-03-31 ENCOUNTER — Inpatient Hospital Stay (HOSPITAL_BASED_OUTPATIENT_CLINIC_OR_DEPARTMENT_OTHER): Payer: BC Managed Care – PPO | Admitting: Oncology

## 2022-03-31 VITALS — BP 138/99 | HR 98 | Temp 99.0°F | Resp 16 | Wt 188.2 lb

## 2022-03-31 DIAGNOSIS — D508 Other iron deficiency anemias: Secondary | ICD-10-CM

## 2022-03-31 DIAGNOSIS — Z8619 Personal history of other infectious and parasitic diseases: Secondary | ICD-10-CM | POA: Insufficient documentation

## 2022-03-31 DIAGNOSIS — M25521 Pain in right elbow: Secondary | ICD-10-CM | POA: Insufficient documentation

## 2022-03-31 DIAGNOSIS — N35919 Unspecified urethral stricture, male, unspecified site: Secondary | ICD-10-CM | POA: Diagnosis not present

## 2022-03-31 DIAGNOSIS — R21 Rash and other nonspecific skin eruption: Secondary | ICD-10-CM | POA: Insufficient documentation

## 2022-03-31 DIAGNOSIS — I10 Essential (primary) hypertension: Secondary | ICD-10-CM | POA: Diagnosis not present

## 2022-03-31 DIAGNOSIS — K900 Celiac disease: Secondary | ICD-10-CM | POA: Diagnosis not present

## 2022-03-31 DIAGNOSIS — G8929 Other chronic pain: Secondary | ICD-10-CM | POA: Diagnosis not present

## 2022-03-31 DIAGNOSIS — E559 Vitamin D deficiency, unspecified: Secondary | ICD-10-CM | POA: Insufficient documentation

## 2022-03-31 DIAGNOSIS — I2510 Atherosclerotic heart disease of native coronary artery without angina pectoris: Secondary | ICD-10-CM | POA: Insufficient documentation

## 2022-03-31 DIAGNOSIS — M419 Scoliosis, unspecified: Secondary | ICD-10-CM | POA: Diagnosis not present

## 2022-03-31 DIAGNOSIS — D66 Hereditary factor VIII deficiency: Secondary | ICD-10-CM | POA: Diagnosis not present

## 2022-03-31 DIAGNOSIS — D509 Iron deficiency anemia, unspecified: Secondary | ICD-10-CM | POA: Insufficient documentation

## 2022-03-31 LAB — CBC WITH DIFFERENTIAL (CANCER CENTER ONLY)
Abs Immature Granulocytes: 0.03 10*3/uL (ref 0.00–0.07)
Basophils Absolute: 0 10*3/uL (ref 0.0–0.1)
Basophils Relative: 1 %
Eosinophils Absolute: 0.1 10*3/uL (ref 0.0–0.5)
Eosinophils Relative: 2 %
HCT: 36.8 % — ABNORMAL LOW (ref 39.0–52.0)
Hemoglobin: 12.8 g/dL — ABNORMAL LOW (ref 13.0–17.0)
Immature Granulocytes: 1 %
Lymphocytes Relative: 23 %
Lymphs Abs: 1.4 10*3/uL (ref 0.7–4.0)
MCH: 28.4 pg (ref 26.0–34.0)
MCHC: 34.8 g/dL (ref 30.0–36.0)
MCV: 81.6 fL (ref 80.0–100.0)
Monocytes Absolute: 0.5 10*3/uL (ref 0.1–1.0)
Monocytes Relative: 9 %
Neutro Abs: 3.7 10*3/uL (ref 1.7–7.7)
Neutrophils Relative %: 64 %
Platelet Count: 282 10*3/uL (ref 150–400)
RBC: 4.51 MIL/uL (ref 4.22–5.81)
RDW: 17.9 % — ABNORMAL HIGH (ref 11.5–15.5)
WBC Count: 5.8 10*3/uL (ref 4.0–10.5)
nRBC: 0 % (ref 0.0–0.2)

## 2022-03-31 LAB — FERRITIN: Ferritin: 30 ng/mL (ref 24–336)

## 2022-03-31 NOTE — Progress Notes (Signed)
  [facility] OFFICE PROGRESS NOTE   Diagnosis: Hemophilia, iron deficiency anemia  INTERVAL HISTORY:   Darrell Graham returns as scheduled.  He underwent a percutaneous intervention 03/24/2022 at [facility] for treatment of CAD of the LAD and RCA.  He is now maintained on aspirin and Plavix.  No bleeding.  He complains of a rash from gluten allergy over the neck and scalp.  He has chronic pain at the right elbow.  Objective:  Vital signs in last 24 hours:  Blood pressure (!) 138/99, pulse 98, temperature 99 F (37.2 C), temperature source Skin, resp. rate 16, weight 188 lb 3.2 oz (85.4 kg), SpO2 97 %.    Resp: Lungs clear bilaterally Cardio: Regular rate and rhythm GI: No hepatosplenomegaly Vascular: No leg edema  Skin: All ecchymoses at the the left arm, no evidence of bleeding at the left arm arterial puncture site   Lab Results:  Lab Results  Component Value Date   WBC 5.5 02/11/2022   HGB 12.4 (L) 02/11/2022   HCT 38.6 (L) 02/11/2022   MCV 80.6 02/11/2022   PLT 291 02/11/2022   NEUTROABS 3.5 02/11/2022    CMP  Lab Results  Component Value Date   NA 142 01/07/2022   K 4.2 01/07/2022   CL 104 01/07/2022   CO2 22 01/07/2022   GLUCOSE 115 (H) 01/07/2022   BUN 11 01/07/2022   CREATININE 0.82 01/07/2022   CALCIUM 9.6 01/07/2022   PROT 6.7 09/23/2015   ALBUMIN 3.1 (L) 09/23/2015   AST 16 09/23/2015   ALT 11 (L) 09/23/2015   ALKPHOS 53 09/23/2015   BILITOT 0.6 09/23/2015   GFRNONAA >60 09/23/2015   GFRAA >60 09/23/2015    No results found for: "CEA1", "CEA", "XKG818", "CA125"    Medications: I have reviewed the patient's current medications.   Assessment/Plan: Hemophilia A    2. History of hepatitis C infection. Negative hepatitis C RNA in November 2014 3. Chronic left ankle and right elbow arthropathy. 4. Celiac disease.   5. History of vitamin D deficiency.   6. History of hypertension.   7. 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    8. diagnosis of "scoliosis."   9. Diagnosis of a "pineal "cyst in September 2012, evaluated by neurology   10. 02/11/2016 sigmoidectomy and partial cystectomy for colovesical fistula 11. 02/28/2016 exploratory laparotomy, repair of anterior and posterior bladder defects and clot evacuation 12. Factor VIII inhibitor August 2017 status post Rituxan, negative inhibitor evaluation September 2018, positive inhibitor 03/17/2022 13.  Urethral stricture 14.  Hospitalization with GI bleed 11/02/2021 through 11/12/2021 15.  History of iron deficiency anemia Venofer weekly for 3 doses beginning 01/30/2022 16.  CAD-status post coronary artery stent placement, proximal LAD and proximal RCA 03/24/2022     Disposition: [name] has hemophilia A.  He is followed at [facility] for hemophilia care.  He underwent coronary artery stent placement last week.  He is now maintained on aspirin and Plavix.  [name] received IV iron for treatment of iron deficiency anemia.  The hemoglobin is now in the low normal range.  We will follow-up in the ferritin level from today.  He will return for an office visit and CBC in approximately 4 months.  He will follow-up at [facility] in the interim.  Thornton Papas, MD  03/31/2022  3:53 PM

## 2022-04-01 ENCOUNTER — Telehealth: Payer: Self-pay

## 2022-04-01 DIAGNOSIS — E291 Testicular hypofunction: Secondary | ICD-10-CM | POA: Diagnosis not present

## 2022-04-01 DIAGNOSIS — Z87448 Personal history of other diseases of urinary system: Secondary | ICD-10-CM | POA: Diagnosis not present

## 2022-04-01 DIAGNOSIS — N411 Chronic prostatitis: Secondary | ICD-10-CM | POA: Diagnosis not present

## 2022-04-01 DIAGNOSIS — R339 Retention of urine, unspecified: Secondary | ICD-10-CM | POA: Diagnosis not present

## 2022-04-01 NOTE — Telephone Encounter (Signed)
Patient gave verbal understanding and had no further questions or concerns  

## 2022-04-01 NOTE — Telephone Encounter (Signed)
-----   Message from Ladene Artist, MD sent at 03/31/2022  5:04 PM EDT ----- Please call patient, the iron level is in the low normal range, call for symptoms of anemia, follow-up as scheduled

## 2022-04-23 DIAGNOSIS — R233 Spontaneous ecchymoses: Secondary | ICD-10-CM | POA: Diagnosis not present

## 2022-04-30 DIAGNOSIS — I255 Ischemic cardiomyopathy: Secondary | ICD-10-CM | POA: Diagnosis not present

## 2022-04-30 DIAGNOSIS — Z9861 Coronary angioplasty status: Secondary | ICD-10-CM | POA: Diagnosis not present

## 2022-04-30 DIAGNOSIS — I519 Heart disease, unspecified: Secondary | ICD-10-CM | POA: Diagnosis not present

## 2022-05-11 DIAGNOSIS — T148XXA Other injury of unspecified body region, initial encounter: Secondary | ICD-10-CM | POA: Diagnosis not present

## 2022-05-11 DIAGNOSIS — L919 Hypertrophic disorder of the skin, unspecified: Secondary | ICD-10-CM | POA: Diagnosis not present

## 2022-05-11 DIAGNOSIS — L989 Disorder of the skin and subcutaneous tissue, unspecified: Secondary | ICD-10-CM | POA: Diagnosis not present

## 2022-05-11 DIAGNOSIS — L299 Pruritus, unspecified: Secondary | ICD-10-CM | POA: Diagnosis not present

## 2022-05-15 DIAGNOSIS — Z8719 Personal history of other diseases of the digestive system: Secondary | ICD-10-CM | POA: Diagnosis not present

## 2022-05-15 DIAGNOSIS — Z9049 Acquired absence of other specified parts of digestive tract: Secondary | ICD-10-CM | POA: Diagnosis not present

## 2022-05-15 DIAGNOSIS — R140 Abdominal distension (gaseous): Secondary | ICD-10-CM | POA: Diagnosis not present

## 2022-05-19 DIAGNOSIS — R7309 Other abnormal glucose: Secondary | ICD-10-CM | POA: Diagnosis not present

## 2022-05-19 DIAGNOSIS — Z862 Personal history of diseases of the blood and blood-forming organs and certain disorders involving the immune mechanism: Secondary | ICD-10-CM | POA: Diagnosis not present

## 2022-05-19 DIAGNOSIS — I10 Essential (primary) hypertension: Secondary | ICD-10-CM | POA: Diagnosis not present

## 2022-05-19 DIAGNOSIS — E782 Mixed hyperlipidemia: Secondary | ICD-10-CM | POA: Diagnosis not present

## 2022-05-19 DIAGNOSIS — E291 Testicular hypofunction: Secondary | ICD-10-CM | POA: Diagnosis not present

## 2022-05-19 DIAGNOSIS — F411 Generalized anxiety disorder: Secondary | ICD-10-CM | POA: Diagnosis not present

## 2022-05-19 DIAGNOSIS — I2510 Atherosclerotic heart disease of native coronary artery without angina pectoris: Secondary | ICD-10-CM | POA: Diagnosis not present

## 2022-05-19 DIAGNOSIS — E559 Vitamin D deficiency, unspecified: Secondary | ICD-10-CM | POA: Diagnosis not present

## 2022-05-20 DIAGNOSIS — Z79620 Long term (current) use of immunosuppressive biologic: Secondary | ICD-10-CM | POA: Diagnosis not present

## 2022-05-20 DIAGNOSIS — Z888 Allergy status to other drugs, medicaments and biological substances status: Secondary | ICD-10-CM | POA: Diagnosis not present

## 2022-05-20 DIAGNOSIS — D66 Hereditary factor VIII deficiency: Secondary | ICD-10-CM | POA: Diagnosis not present

## 2022-05-20 DIAGNOSIS — Z881 Allergy status to other antibiotic agents status: Secondary | ICD-10-CM | POA: Diagnosis not present

## 2022-05-20 DIAGNOSIS — Z885 Allergy status to narcotic agent status: Secondary | ICD-10-CM | POA: Diagnosis not present

## 2022-05-27 DIAGNOSIS — K760 Fatty (change of) liver, not elsewhere classified: Secondary | ICD-10-CM | POA: Diagnosis not present

## 2022-05-27 DIAGNOSIS — R160 Hepatomegaly, not elsewhere classified: Secondary | ICD-10-CM | POA: Diagnosis not present

## 2022-06-10 DIAGNOSIS — M79671 Pain in right foot: Secondary | ICD-10-CM | POA: Diagnosis not present

## 2022-06-10 DIAGNOSIS — M79672 Pain in left foot: Secondary | ICD-10-CM | POA: Diagnosis not present

## 2022-06-10 DIAGNOSIS — M25572 Pain in left ankle and joints of left foot: Secondary | ICD-10-CM | POA: Diagnosis not present

## 2022-06-10 DIAGNOSIS — M25571 Pain in right ankle and joints of right foot: Secondary | ICD-10-CM | POA: Diagnosis not present

## 2022-06-22 DIAGNOSIS — I25118 Atherosclerotic heart disease of native coronary artery with other forms of angina pectoris: Secondary | ICD-10-CM | POA: Diagnosis not present

## 2022-06-22 DIAGNOSIS — I255 Ischemic cardiomyopathy: Secondary | ICD-10-CM | POA: Diagnosis not present

## 2022-07-06 ENCOUNTER — Inpatient Hospital Stay: Payer: BC Managed Care – PPO

## 2022-07-06 ENCOUNTER — Inpatient Hospital Stay: Payer: BC Managed Care – PPO | Admitting: Oncology

## 2023-01-08 ENCOUNTER — Encounter: Payer: Self-pay | Admitting: Nurse Practitioner

## 2024-02-14 ENCOUNTER — Telehealth: Payer: Self-pay | Admitting: Diagnostic Neuroimaging

## 2024-02-14 ENCOUNTER — Telehealth: Payer: Self-pay

## 2024-02-14 NOTE — Telephone Encounter (Signed)
 Pt inquired about making an appointment, phone rep reached out to [facility] for assistance

## 2024-02-14 NOTE — Telephone Encounter (Signed)
 Pt cld office regarding pineal cysts and dizziness, last visit w/Dr. Margaret on 10/23/2021. Pt stated he feels some head pressure which makes him feel like he is dizzy and has had some nausea. Has also noticed facial redness as well and is concerned it could be from the cysts getting bigger. Pt stated he has not seen his PCP in regards to his symptoms. Informed Pt he needs to start with PCP regarding symptoms and if necessary PCP can order MRI and/or can refer Pt to [facility]/Dr. Penumalli. Pt voiced understanding and thanks for the information.

## 2024-02-28 ENCOUNTER — Encounter: Payer: Self-pay | Admitting: Nurse Practitioner

## 2024-05-22 ENCOUNTER — Institutional Professional Consult (permissible substitution): Admitting: Diagnostic Neuroimaging

## 2024-07-31 ENCOUNTER — Telehealth: Payer: Self-pay | Admitting: Diagnostic Neuroimaging

## 2024-07-31 NOTE — Telephone Encounter (Signed)
 Pt asked to cx, he said is not needed

## 2024-08-07 ENCOUNTER — Institutional Professional Consult (permissible substitution): Admitting: Diagnostic Neuroimaging

## 2024-08-08 ENCOUNTER — Ambulatory Visit: Admitting: Physician Assistant

## 2024-08-08 ENCOUNTER — Encounter: Payer: Self-pay | Admitting: Nurse Practitioner

## 2024-08-08 ENCOUNTER — Encounter: Payer: Self-pay | Admitting: Physician Assistant

## 2024-08-08 DIAGNOSIS — B079 Viral wart, unspecified: Secondary | ICD-10-CM | POA: Diagnosis not present

## 2024-08-08 DIAGNOSIS — L309 Dermatitis, unspecified: Secondary | ICD-10-CM

## 2024-08-08 MED ORDER — CEPHALEXIN 500 MG PO CAPS: 500.0000 mg | ORAL_CAPSULE | Freq: Two times a day (BID) | ORAL | 0 refills | Status: AC

## 2024-08-08 MED ORDER — CLOBETASOL PROPIONATE 0.05 % EX OINT: 1.0000 | TOPICAL_OINTMENT | Freq: Two times a day (BID) | CUTANEOUS | 2 refills | Status: AC

## 2024-08-08 NOTE — Patient Instructions (Signed)
 Darrell Graham

## 2024-08-08 NOTE — Progress Notes (Signed)
" ° °  New Patient Visit   Subjective  Darrell Graham. is a 59 y.o. male NEW PATIENT who presents for the following:  Rash of his buttock, abdomen, neck, right arm and thighs that has been there for 2 years. It is very itchy. He states that he had dermatitis herpetiformis about 15 years ago. He has used desoximetasone, clobetasol  and TMC. He washes with his clothes with Tide and he washes in the shower with Nivea.   Other concerns: He has a spot on his scrotum that he wishes to have frozen. Does have history of genital herpes. Scaly area on right hand and left side of nose.   He has celiac, hemophilia and CHF.  Accompanied by mother (dementia) today.   The following portions of the chart were reviewed this encounter and updated as appropriate: medications, allergies, medical history  Review of Systems:  No other skin or systemic complaints except as noted in HPI or Assessment and Plan.  Objective  Well appearing patient in no apparent distress; mood and affect are within normal limits.  A full examination was performed including scalp, head, eyes, ears, nose, lips, neck, chest, axillae, abdomen, back, buttocks, bilateral upper extremities, bilateral lower extremities, hands, feet, fingers, toes, fingernails, and toenails. All findings within normal limits unless otherwise noted below.           Relevant exam findings are noted in the Assessment and Plan.  Right hand, scrotum, left lower eyelid (3) Verrucous papules   Assessment & Plan   DERMATITIS UNSPECIFIED / IMPETIGO  Exam: erythematous honey crusted plaques and patches    Differential diagnosis:  - contact vs: allergic dermatitis, less likely DH as rash does not appear consistent with that of DH, vs: psoriasis   **biopsy deferred today as patient has history of hemophilia**   Treatment Plan: Clobetasol  ointment Apply twice daily to affected areas. Recommend using clobetasol  with wet wraps at night.  Keflex  500 mg  1 capsule twice daily for 10 days  Recommend he change to Dial antibacterial soap. VIRAL WARTS, UNSPECIFIED TYPE (3) Right hand, scrotum, left lower eyelid (3) - Destruction of lesion - Right hand, scrotum, left lower eyelid (3) Complexity: simple   Destruction method: cryotherapy   Informed consent: discussed and consent obtained   Timeout:  patient name, date of birth, surgical site, and procedure verified Lesion destroyed using liquid nitrogen: Yes   Region frozen until ice ball extended beyond lesion: Yes   Outcome: patient tolerated procedure well with no complications   Post-procedure details: wound care instructions given    DERMATITIS, UNSPECIFIED    Return in about 6 weeks (around 09/19/2024) for Follow up.  I, Roseline Hutchinson, CMA, am acting as scribe for Rachel Rison K, PA-C .   Documentation: I have reviewed the above documentation for accuracy and completeness, and I agree with the above.  Rashan Rounsaville K, PA-C    "

## 2024-09-01 ENCOUNTER — Emergency Department (HOSPITAL_BASED_OUTPATIENT_CLINIC_OR_DEPARTMENT_OTHER)
Admission: EM | Admit: 2024-09-01 | Discharge: 2024-09-02 | Disposition: A | Source: Ambulatory Visit | Attending: Emergency Medicine | Admitting: Emergency Medicine

## 2024-09-01 ENCOUNTER — Emergency Department (HOSPITAL_BASED_OUTPATIENT_CLINIC_OR_DEPARTMENT_OTHER)

## 2024-09-01 ENCOUNTER — Encounter (HOSPITAL_BASED_OUTPATIENT_CLINIC_OR_DEPARTMENT_OTHER): Payer: Self-pay | Admitting: Emergency Medicine

## 2024-09-01 DIAGNOSIS — Z7982 Long term (current) use of aspirin: Secondary | ICD-10-CM | POA: Diagnosis not present

## 2024-09-01 DIAGNOSIS — R1032 Left lower quadrant pain: Secondary | ICD-10-CM | POA: Insufficient documentation

## 2024-09-01 DIAGNOSIS — I2510 Atherosclerotic heart disease of native coronary artery without angina pectoris: Secondary | ICD-10-CM | POA: Insufficient documentation

## 2024-09-01 DIAGNOSIS — E119 Type 2 diabetes mellitus without complications: Secondary | ICD-10-CM | POA: Diagnosis not present

## 2024-09-01 DIAGNOSIS — Z7901 Long term (current) use of anticoagulants: Secondary | ICD-10-CM | POA: Insufficient documentation

## 2024-09-01 DIAGNOSIS — I110 Hypertensive heart disease with heart failure: Secondary | ICD-10-CM | POA: Insufficient documentation

## 2024-09-01 DIAGNOSIS — I5089 Other heart failure: Secondary | ICD-10-CM | POA: Insufficient documentation

## 2024-09-01 DIAGNOSIS — Z79899 Other long term (current) drug therapy: Secondary | ICD-10-CM | POA: Insufficient documentation

## 2024-09-01 DIAGNOSIS — J45909 Unspecified asthma, uncomplicated: Secondary | ICD-10-CM | POA: Insufficient documentation

## 2024-09-01 LAB — CBC WITH DIFFERENTIAL/PLATELET
Abs Immature Granulocytes: 0.01 10*3/uL (ref 0.00–0.07)
Basophils Absolute: 0.1 10*3/uL (ref 0.0–0.1)
Basophils Relative: 1 %
Eosinophils Absolute: 0.1 10*3/uL (ref 0.0–0.5)
Eosinophils Relative: 2 %
HCT: 42 % (ref 39.0–52.0)
Hemoglobin: 14.6 g/dL (ref 13.0–17.0)
Immature Granulocytes: 0 %
Lymphocytes Relative: 20 %
Lymphs Abs: 1.4 10*3/uL (ref 0.7–4.0)
MCH: 31.2 pg (ref 26.0–34.0)
MCHC: 34.8 g/dL (ref 30.0–36.0)
MCV: 89.7 fL (ref 80.0–100.0)
Monocytes Absolute: 0.8 10*3/uL (ref 0.1–1.0)
Monocytes Relative: 11 %
Neutro Abs: 4.6 10*3/uL (ref 1.7–7.7)
Neutrophils Relative %: 66 %
Platelets: 289 10*3/uL (ref 150–400)
RBC: 4.68 MIL/uL (ref 4.22–5.81)
RDW: 12.7 % (ref 11.5–15.5)
WBC: 6.9 10*3/uL (ref 4.0–10.5)
nRBC: 0 % (ref 0.0–0.2)

## 2024-09-01 LAB — COMPREHENSIVE METABOLIC PANEL WITH GFR
ALT: 23 U/L (ref 0–44)
AST: 36 U/L (ref 15–41)
Albumin: 4.9 g/dL (ref 3.5–5.0)
Alkaline Phosphatase: 78 U/L (ref 38–126)
Anion gap: 14 (ref 5–15)
BUN: 8 mg/dL (ref 6–20)
CO2: 24 mmol/L (ref 22–32)
Calcium: 10 mg/dL (ref 8.9–10.3)
Chloride: 102 mmol/L (ref 98–111)
Creatinine, Ser: 0.83 mg/dL (ref 0.61–1.24)
GFR, Estimated: >60 mL/min
Glucose, Bld: 163 mg/dL — ABNORMAL HIGH (ref 70–99)
Potassium: 4.1 mmol/L (ref 3.5–5.1)
Sodium: 140 mmol/L (ref 135–145)
Total Bilirubin: 0.6 mg/dL (ref 0.0–1.2)
Total Protein: 7.9 g/dL (ref 6.5–8.1)

## 2024-09-01 LAB — URINALYSIS, ROUTINE W REFLEX MICROSCOPIC
Bilirubin Urine: NEGATIVE
Glucose, UA: NEGATIVE mg/dL
Hgb urine dipstick: NEGATIVE
Ketones, ur: NEGATIVE mg/dL
Leukocytes,Ua: NEGATIVE
Nitrite: NEGATIVE
Specific Gravity, Urine: 1.022 (ref 1.005–1.030)
pH: 7 (ref 5.0–8.0)

## 2024-09-01 LAB — LIPASE, BLOOD: Lipase: 43 U/L (ref 11–51)

## 2024-09-01 MED ORDER — ACETAMINOPHEN 500 MG PO TABS
1000.0000 mg | ORAL_TABLET | Freq: Once | ORAL | Status: AC
  Administered 2024-09-01: 1000 mg via ORAL
  Filled 2024-09-01: qty 2

## 2024-09-01 NOTE — ED Provider Notes (Signed)
 "  EMERGENCY DEPARTMENT AT [facility] Provider Note   CSN: [id number] Arrival date & time: 09/01/24  8090     Patient presents with: Abdominal Pain   Darrell Graham. is a 59 y.o. male.  {Add pertinent medical, surgical, social history, OB history to HPI:32947} HPI     5 days waxing and waning pain  Put it off last 3 days, yesterday pain not so bad, spreading to groin area Moderate pain coming and going 4-5 left side and a little bit of groin pain No fever, chills No vomiting, has had some nausea off and on No diarrhea or constipation   Past Medical History:  Diagnosis Date   Allergy    Asthma    Celiac disease    Chest pain of uncertain etiology 01/05/2022   Chronic arthropathy    left ankle & right elbow   Coronary artery disease    Deviated nasal septum    Diverticulitis    Essential hypertension 01/05/2022   Glaucoma    H/O vitamin D  deficiency    Hemophilia A (HCC)    Hepatitis C virus    recent viral titer negative   Hernia    HFrEF (heart failure with reduced ejection fraction) (HCC)    High blood pressure    History of pineal cyst 08/26/2018   OCD (obsessive compulsive disorder) 07/01/2015   Scoliosis    Tachycardia 01/05/2022   Telangiectasias    upper back and chest   UTI (urinary tract infection) 09/2015    Prior to Admission medications  Medication Sig Start Date End Date Taking? Authorizing Provider  ALPRAZolam  (XANAX ) 0.25 MG tablet Take 0.25 mg by mouth at bedtime as needed for anxiety.    [provider]  amLODipine (NORVASC) 5 MG tablet Take 5 mg by mouth daily. 07/18/21   [provider]  Antihemophilic Factor  rAHF-PAF (XYNTHA  SOLOFUSE) 3000 UNITS KIT Inject 3,000 Units into the vein as needed. Patient taking differently: Inject 3,000 Units into the vein as needed (for bleeding). 07/22/15   Cloretta Arley NOVAK, MD  aspirin EC 81 MG tablet Take 81 mg by mouth daily. Swallow whole.    [provider]  clobetasol  ointment (TEMOVATE ) 0.05 % Apply 1 Application topically 2 (two) times daily. Apply to affected areas of rash twice daily 08/08/24   Sandridge, Brenda K, PA-C  clopidogrel (PLAVIX) 75 MG tablet Take 75 mg by mouth daily.    [provider]  Emicizumab-kxwh [facility]) Inject into the skin.    [provider]  HYDROcodone -acetaminophen  (NORCO/VICODIN) 5-325 MG tablet Take 1-2 tablets by mouth every 6 (six) hours as needed for moderate pain or severe pain. 09/27/15   Fairy Frames, MD  lidocaine  (LIDODERM ) 5 % lidocaine  5 % topical patch  APPLY 1 2 PATCH TO INTACT SKIN ONCE A DAY. REMOVE AFTER 12 HOURS AS NEEDED    [provider]  metoprolol  succinate (TOPROL  XL) 25 MG 24 hr tablet Take 1 tablet (25 mg total) by mouth daily. Patient not taking: Reported on 03/31/2022 02/13/22   Walker, Caitlin S, NP  mupirocin ointment (BACTROBAN) 2 % Apply 1 Application topically 3 (three) times daily. 09/02/21   [provider]  Naphazoline-Pheniramine 0.027-0.315 % SOLN Apply 1 drop to eye daily as needed (for dry eyes).    [provider]  pantoprazole (PROTONIX) 40 MG tablet Take 40 mg daily by mouth. 02/24/16   [provider]  rosuvastatin  (CRESTOR ) 10 MG tablet Take 1  tablet (10 mg total) by mouth daily. 02/13/22 08/12/22  Walker, Caitlin S, NP  telmisartan (MICARDIS) 40 MG tablet Take 40 mg by mouth daily.    [provider]    Allergies: Aspirin, Doxycycline, Hemophilia support [acid blockers support], Codeine, Gluten meal, Tetracycline hcl, Tetracyclines & related, and Wheat    Review of Systems  Updated Vital Signs BP (!) 168/103 (BP Location: Right Arm)   Pulse (!) 111   Temp 98 F (36.7 C)   Resp 15   SpO2 98%   Physical Exam  (all labs ordered are listed, but only abnormal results are displayed) Labs Reviewed  COMPREHENSIVE METABOLIC PANEL WITH GFR - Abnormal; Notable for the following components:       Result Value   Glucose, Bld 163 (*)    All other components within normal limits  URINALYSIS, ROUTINE W REFLEX MICROSCOPIC - Abnormal; Notable for the following components:   Protein, ur TRACE (*)    All other components within normal limits  CBC WITH DIFFERENTIAL/PLATELET  LIPASE, BLOOD    EKG: None  Radiology: No results found.  {Document cardiac monitor, telemetry assessment procedure when appropriate:32947} Procedures   Medications Ordered in the ED - No data to display    {Click here for ABCD2, HEART and other calculators REFRESH Note before signing:1}                              Medical Decision Making Amount and/or Complexity of Data Reviewed Labs: ordered.   ***  {Document critical care time when appropriate  Document review of labs and clinical decision tools ie CHADS2VASC2, etc  Document your independent review of radiology images and any outside records  Document your discussion with family members, caretakers and with consultants  Document social determinants of health affecting pt's care  Document your decision making why or why not admission, treatments were needed:32947:::1}   Final diagnoses:  None    ED Discharge Orders     None        "

## 2024-09-01 NOTE — ED Triage Notes (Signed)
 Lower left abdo pain x 5 day into groin Hx diverticulitis, hemophilia, chf See at [facility] yesterday lab done and sent in augmenting ( did not start yet), d dimer sent and elevated came eval/ct Denies sob no chest pain

## 2024-09-02 MED ORDER — IBUPROFEN 600 MG PO TABS: 600.0000 mg | ORAL_TABLET | Freq: Four times a day (QID) | ORAL | 0 refills | Status: AC | PRN

## 2024-09-02 NOTE — ED Provider Notes (Signed)
 Patient signed out pending CT imaging.  CT imaging is negative for acute diverticulitis.  Negative for any pathology.  Patient was able to tolerate p.o. without difficulty.  Does continue to complain of some left lower quadrant pain but no obvious source.  Recommend ibuprofen .   Bari Charmaine FALCON, MD 09/02/24 [phone]

## 2024-09-26 ENCOUNTER — Ambulatory Visit: Admitting: Physician Assistant
